# Patient Record
Sex: Female | Born: 1960 | Race: White | Hispanic: No | Smoking: Never smoker
Health system: Southern US, Community
[De-identification: ages and names within clinical notes are randomized; demographics above are authoritative.]

## PROBLEM LIST (undated history)

## (undated) ENCOUNTER — Ambulatory Visit: Discharge status: Absent without leave | Payer: 59

## (undated) DIAGNOSIS — N760 Acute vaginitis: Secondary | ICD-10-CM

## (undated) DIAGNOSIS — F429 Obsessive-compulsive disorder, unspecified: Secondary | ICD-10-CM

## (undated) DIAGNOSIS — K649 Unspecified hemorrhoids: Secondary | ICD-10-CM

## (undated) DIAGNOSIS — D369 Benign neoplasm, unspecified site: Secondary | ICD-10-CM

## (undated) DIAGNOSIS — F259 Schizoaffective disorder, unspecified: Secondary | ICD-10-CM

## (undated) DIAGNOSIS — N950 Postmenopausal bleeding: Secondary | ICD-10-CM

## (undated) DIAGNOSIS — B9689 Other specified bacterial agents as the cause of diseases classified elsewhere: Secondary | ICD-10-CM

## (undated) DIAGNOSIS — I10 Essential (primary) hypertension: Secondary | ICD-10-CM

## (undated) HISTORY — DX: Postmenopausal bleeding: N95.0

## (undated) HISTORY — DX: Unspecified hemorrhoids: K64.9

## (undated) HISTORY — DX: Essential (primary) hypertension: I10

## (undated) HISTORY — DX: Schizoaffective disorder, unspecified: F25.9

## (undated) HISTORY — DX: Other specified bacterial agents as the cause of diseases classified elsewhere: B96.89

## (undated) HISTORY — DX: Obsessive-compulsive disorder, unspecified: F42.9

## (undated) HISTORY — DX: Acute vaginitis: N76.0

## (undated) HISTORY — DX: Benign neoplasm, unspecified site: D36.9

## (undated) HISTORY — PX: BUNIONECTOMY: SHX129

---

## 1999-11-25 ENCOUNTER — Inpatient Hospital Stay (HOSPITAL_COMMUNITY): Admission: AD | Admit: 1999-11-25 | Discharge: 1999-11-30 | Payer: Self-pay | Admitting: *Deleted

## 2001-02-02 ENCOUNTER — Other Ambulatory Visit: Admission: RE | Admit: 2001-02-02 | Discharge: 2001-02-02 | Payer: Self-pay | Admitting: *Deleted

## 2001-02-21 ENCOUNTER — Encounter: Payer: Self-pay | Admitting: Emergency Medicine

## 2001-02-21 ENCOUNTER — Emergency Department (HOSPITAL_COMMUNITY): Admission: EM | Admit: 2001-02-21 | Discharge: 2001-02-21 | Payer: Self-pay | Admitting: Emergency Medicine

## 2002-06-25 ENCOUNTER — Encounter: Payer: Self-pay | Admitting: Family Medicine

## 2002-06-25 ENCOUNTER — Ambulatory Visit (HOSPITAL_COMMUNITY): Admission: RE | Admit: 2002-06-25 | Discharge: 2002-06-25 | Payer: Self-pay | Admitting: Family Medicine

## 2002-08-06 ENCOUNTER — Other Ambulatory Visit: Admission: RE | Admit: 2002-08-06 | Discharge: 2002-08-06 | Payer: Self-pay | Admitting: *Deleted

## 2003-01-11 ENCOUNTER — Ambulatory Visit (HOSPITAL_COMMUNITY): Admission: RE | Admit: 2003-01-11 | Discharge: 2003-01-11 | Payer: Self-pay | Admitting: *Deleted

## 2003-01-11 ENCOUNTER — Encounter: Payer: Self-pay | Admitting: *Deleted

## 2003-07-06 ENCOUNTER — Ambulatory Visit (HOSPITAL_COMMUNITY): Admission: RE | Admit: 2003-07-06 | Discharge: 2003-07-06 | Payer: Self-pay | Admitting: Family Medicine

## 2004-08-22 ENCOUNTER — Ambulatory Visit (HOSPITAL_COMMUNITY): Admission: RE | Admit: 2004-08-22 | Discharge: 2004-08-22 | Payer: Self-pay | Admitting: Family Medicine

## 2005-08-26 ENCOUNTER — Ambulatory Visit (HOSPITAL_COMMUNITY): Admission: RE | Admit: 2005-08-26 | Discharge: 2005-08-26 | Payer: Self-pay | Admitting: Family Medicine

## 2005-11-27 ENCOUNTER — Inpatient Hospital Stay (HOSPITAL_COMMUNITY): Admission: RE | Admit: 2005-11-27 | Discharge: 2005-12-02 | Payer: Self-pay | Admitting: Psychiatry

## 2005-11-28 ENCOUNTER — Ambulatory Visit: Payer: Self-pay | Admitting: Psychiatry

## 2006-05-28 ENCOUNTER — Emergency Department (HOSPITAL_COMMUNITY): Admission: EM | Admit: 2006-05-28 | Discharge: 2006-05-28 | Payer: Self-pay | Admitting: Emergency Medicine

## 2006-08-28 ENCOUNTER — Ambulatory Visit (HOSPITAL_COMMUNITY): Admission: RE | Admit: 2006-08-28 | Discharge: 2006-08-28 | Payer: Self-pay | Admitting: Family Medicine

## 2007-09-07 ENCOUNTER — Ambulatory Visit (HOSPITAL_COMMUNITY): Admission: RE | Admit: 2007-09-07 | Discharge: 2007-09-07 | Payer: Self-pay | Admitting: Family Medicine

## 2008-09-07 ENCOUNTER — Ambulatory Visit (HOSPITAL_COMMUNITY): Admission: RE | Admit: 2008-09-07 | Discharge: 2008-09-07 | Payer: Self-pay | Admitting: Family Medicine

## 2009-09-11 ENCOUNTER — Ambulatory Visit (HOSPITAL_COMMUNITY)
Admission: RE | Admit: 2009-09-11 | Discharge: 2009-09-11 | Payer: Self-pay | Source: Home / Self Care | Admitting: Family Medicine

## 2010-08-24 ENCOUNTER — Other Ambulatory Visit (HOSPITAL_COMMUNITY): Payer: Self-pay | Admitting: Family Medicine

## 2010-08-24 DIAGNOSIS — Z1239 Encounter for other screening for malignant neoplasm of breast: Secondary | ICD-10-CM

## 2010-08-26 ENCOUNTER — Encounter: Payer: Self-pay | Admitting: Family Medicine

## 2010-09-13 ENCOUNTER — Ambulatory Visit (HOSPITAL_COMMUNITY)
Admission: RE | Admit: 2010-09-13 | Discharge: 2010-09-13 | Disposition: A | Discharge status: Home / Self Care | Payer: PRIVATE HEALTH INSURANCE | Source: Ambulatory Visit | Attending: Family Medicine | Admitting: Family Medicine

## 2010-09-13 DIAGNOSIS — Z1239 Encounter for other screening for malignant neoplasm of breast: Secondary | ICD-10-CM

## 2010-09-13 DIAGNOSIS — Z1231 Encounter for screening mammogram for malignant neoplasm of breast: Secondary | ICD-10-CM | POA: Insufficient documentation

## 2010-12-21 NOTE — Discharge Summary (Signed)
[name], [name] NO.:  [id number]   MEDICAL RECORD NO.:  [id number]          PATIENT TYPE:  IPS   LOCATION:  0506                          FACILITY:  [facility]   PHYSICIAN:  Anselm Jungling, MD  DATE OF BIRTH:  11-15-1960   DATE OF ADMISSION:  11/27/2005  DATE OF DISCHARGE:  12/02/2005                                 DISCHARGE SUMMARY   IDENTIFYING DATA AND REASON FOR ADMISSION:  The patient is a 50 year old  divorced white female with a history of schizoaffective disorder, and a  patient of Dr. Betti Cruz.  She was admitted for exacerbation of psychotic  symptoms including auditory hallucinations and impulses to harm people.  She  had been undergoing various medication changes recently, prior to admission.  Please refer to the admission note for further details pertaining to the  symptoms, circumstances and history that led to hospitalization.  She was  given an initial Axis I diagnosis of schizoaffective disorder, acute  exacerbation, with psychosis.   MEDICAL AND LABORATORY:  The patient was medically and physically assessed  by the psychiatric nurse practitioner upon admission.  There were no  significant medical issues during this brief inpatient psychiatric stay.   HOSPITAL COURSE:  The patient presented as a pleasant, cooperative, well  nourished and normally developed adult female who was fully oriented.  Her  thoughts and speech were normally organized, and there were no overtly  delusional statements.  Her mood actually appeared neutral, and her affect  was pleasant and appropriate.  She did admit to auditory hallucinations.  She very much wanted help with medication stabilization.  She expressed  concerns that without treatment that she would perhaps respond to impulses  and voices telling her to be aggressive towards others.   The patient was brought to [name] on a regimen of Geodon 60 mg q.h.s., Depakote  1000 mg q.h.s., and Prozac 40 mg daily.  She was  also weaning off Anafranil  at the time of admission.  Her Geodon was increased to 60 mg b.i.d., and  Depakote ER was increased to 1000 mg q.h.s.  Later, Geodon was further  increased to 120 mg at bedtime only.  Ambien 10 mg at bedtime was given on  an as-needed basis for sleep.  She was continued on Prozac 20 mg daily.  Depakote level was pending at the time of discharge.   Over her five-day inpatient stay, the patient remained pleasant and  appropriate.  By the fourth hospital day, her auditory hallucinations had  abated, and she experienced no more thoughts or impulses to be aggressive  towards anyone else.  On the sixth hospital day, she indicated that she felt  ready for discharge.  She was felt to be significantly improved and not to  be a danger to herself or anyone else.   AFTERCARE:  The patient was to follow-up with Dr. Betti Cruz on Dec 05, 2005.  In  addition, there was to be an appointment with Scarlette Slice at the mental  health center, to be scheduled at the time of this dictation.   DISCHARGE MEDICATIONS:  1.  Depakote ER 1000 mg q.h.s.  2.  Prozac 40 mg daily.  3.  Geodon 120 mg q.h.s.  4.  Ambien 10 mg q.h.s. p.r.n. insomnia.   DISCHARGE DIAGNOSIS:  AXIS I:  Schizoaffective disorder, not otherwise  specified.  AXIS II:  Deferred.  AXIS III:  No acute or chronic illnesses.  AXIS IV:  Stressors severe.  AXIS V:  GAF on discharge 60.           ______________________________  Anselm Jungling, MD  Electronically Signed     SPB/MEDQ  D:  12/02/2005  T:  12/02/2005  Job:  [id number]
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
## 2011-08-05 ENCOUNTER — Other Ambulatory Visit (HOSPITAL_COMMUNITY): Payer: Self-pay | Admitting: Family Medicine

## 2011-08-05 DIAGNOSIS — Z139 Encounter for screening, unspecified: Secondary | ICD-10-CM

## 2011-09-16 ENCOUNTER — Ambulatory Visit (HOSPITAL_COMMUNITY)
Admission: RE | Admit: 2011-09-16 | Discharge: 2011-09-16 | Disposition: A | Discharge status: Home / Self Care | Payer: PRIVATE HEALTH INSURANCE | Source: Ambulatory Visit | Attending: Family Medicine | Admitting: Family Medicine

## 2011-09-16 DIAGNOSIS — Z1231 Encounter for screening mammogram for malignant neoplasm of breast: Secondary | ICD-10-CM | POA: Insufficient documentation

## 2011-09-16 DIAGNOSIS — Z139 Encounter for screening, unspecified: Secondary | ICD-10-CM

## 2011-09-23 ENCOUNTER — Other Ambulatory Visit: Payer: Self-pay | Admitting: Family Medicine

## 2011-09-23 DIAGNOSIS — R928 Other abnormal and inconclusive findings on diagnostic imaging of breast: Secondary | ICD-10-CM

## 2011-10-02 ENCOUNTER — Ambulatory Visit (HOSPITAL_COMMUNITY)
Admission: RE | Admit: 2011-10-02 | Discharge: 2011-10-02 | Disposition: A | Discharge status: Home / Self Care | Payer: PRIVATE HEALTH INSURANCE | Source: Ambulatory Visit | Attending: Family Medicine | Admitting: Family Medicine

## 2011-10-02 DIAGNOSIS — R928 Other abnormal and inconclusive findings on diagnostic imaging of breast: Secondary | ICD-10-CM

## 2012-04-18 ENCOUNTER — Encounter (HOSPITAL_COMMUNITY): Payer: Self-pay | Admitting: Emergency Medicine

## 2012-04-18 ENCOUNTER — Emergency Department (HOSPITAL_COMMUNITY)
Admission: EM | Admit: 2012-04-18 | Discharge: 2012-04-19 | Disposition: A | Discharge status: Home / Self Care | Payer: PRIVATE HEALTH INSURANCE | Attending: Emergency Medicine | Admitting: Emergency Medicine

## 2012-04-18 DIAGNOSIS — T50901A Poisoning by unspecified drugs, medicaments and biological substances, accidental (unintentional), initial encounter: Secondary | ICD-10-CM

## 2012-04-18 DIAGNOSIS — F319 Bipolar disorder, unspecified: Secondary | ICD-10-CM | POA: Insufficient documentation

## 2012-04-18 DIAGNOSIS — T43591A Poisoning by other antipsychotics and neuroleptics, accidental (unintentional), initial encounter: Secondary | ICD-10-CM | POA: Insufficient documentation

## 2012-04-18 DIAGNOSIS — T43501A Poisoning by unspecified antipsychotics and neuroleptics, accidental (unintentional), initial encounter: Secondary | ICD-10-CM | POA: Insufficient documentation

## 2012-04-18 LAB — POCT PREGNANCY, URINE: Preg Test, Ur: NEGATIVE

## 2012-04-18 NOTE — ED Notes (Addendum)
Patient has 2 bottles of Geodon. States she was took three 60mg  capsules, then took three of the 80mg  capsules. States it was an accident, that she didn't read the label before taking the medicine. Patient alert and oriented at triage but states she feels "sleepy."

## 2012-04-19 LAB — RAPID URINE DRUG SCREEN, HOSP PERFORMED
Amphetamines: NOT DETECTED
Benzodiazepines: NOT DETECTED
Cocaine: NOT DETECTED
Opiates: NOT DETECTED
Tetrahydrocannabinol: NOT DETECTED

## 2012-04-19 LAB — URINALYSIS, ROUTINE W REFLEX MICROSCOPIC
Ketones, ur: NEGATIVE mg/dL
Leukocytes, UA: NEGATIVE
Protein, ur: NEGATIVE mg/dL
Urobilinogen, UA: 0.2 mg/dL (ref 0.0–1.0)
pH: 6 (ref 5.0–8.0)

## 2012-04-19 LAB — ACETAMINOPHEN LEVEL: Acetaminophen (Tylenol), Serum: <15 ug/mL (ref 10–30)

## 2012-04-19 NOTE — ED Provider Notes (Signed)
History     CSN: [id number]  Arrival date & time 04/18/12  2207   First MD Initiated Contact with Patient 04/18/12 2236      Chief Complaint  Patient presents with  . Drug Overdose    (Consider location/radiation/quality/duration/timing/severity/associated sxs/prior treatment) HPI Comments: [name] [name] presents with an accidental overdose of Geodon.  At 8 pm, she took all of her evening medicines which included geodon 180 mg which was just prescribed for her 2 days ago,  A reduced dose from the 240 mg she had been on for the past month.  She forgot to remove the older geodon bottle from her supply and therefore took this as well.  She reports drowsiness, but no other symptoms.  She denies suicidal thoughts or intent and does not have a history of suicidal gestures or attempts.    The history is provided by the patient.    Past Medical History  Diagnosis Date  . Depression   . Bipolar 1 disorder     Past Surgical History  Procedure Date  . Bunionectomy     History reviewed. No pertinent family history.  History  Substance Use Topics  . Smoking status: Never Smoker   . Smokeless tobacco: Not on file  . Alcohol Use: No    OB History    Grav Para Term Preterm Abortions TAB SAB Ect Mult Living                  Review of Systems  Constitutional: Positive for fatigue. Negative for fever.  HENT: Negative for congestion, sore throat and neck pain.   Eyes: Negative.   Respiratory: Negative for chest tightness and shortness of breath.   Cardiovascular: Negative for chest pain.  Gastrointestinal: Negative for nausea and abdominal pain.  Genitourinary: Negative.   Musculoskeletal: Negative for joint swelling and arthralgias.  Skin: Negative.  Negative for rash and wound.  Neurological: Negative for dizziness, weakness, light-headedness, numbness and headaches.  Hematological: Negative.   Psychiatric/Behavioral: Negative.     Allergies  Penicillins  Home  Medications   Current Outpatient Rx  Name Route Sig Dispense Refill  . DIVALPROEX SODIUM 500 MG PO TBEC Oral Take 1,000 mg by mouth at bedtime.    [name] ZIPRASIDONE HCL 60 MG PO CAPS Oral Take 180 mg by mouth at bedtime.    [name] ZIPRASIDONE HCL 80 MG PO CAPS Oral Take 240 mg by mouth daily.      BP 135/81  Pulse 87  Temp 97.8 F (36.6 C) (Oral)  Resp 20  SpO2 96%  LMP 03/18/2012  Physical Exam  Nursing note and vitals reviewed. Constitutional: She appears well-developed and well-nourished.  HENT:  Head: Normocephalic and atraumatic.  Eyes: Conjunctivae normal are normal.  Neck: Normal range of motion.  Cardiovascular: Normal rate, regular rhythm, normal heart sounds and intact distal pulses.   Pulmonary/Chest: Effort normal and breath sounds normal. She has no wheezes.  Abdominal: Soft. Bowel sounds are normal. There is no tenderness.  Musculoskeletal: Normal range of motion.  Neurological: She is alert.  Skin: Skin is warm and dry.  Psychiatric: She has a normal mood and affect.    ED Course  Procedures (including critical care time)  Labs Reviewed  URINALYSIS, ROUTINE W REFLEX MICROSCOPIC - Abnormal; Notable for the following:    Specific Gravity, Urine <1.005 (*)     All other components within normal limits  SALICYLATE LEVEL - Abnormal; Notable for the following:    Salicylate Lvl <2.0 (*)  All other components within normal limits  ACETAMINOPHEN LEVEL  URINE RAPID DRUG SCREEN (HOSP PERFORMED)  POCT PREGNANCY, URINE   No results found.   1. Accidental drug overdose       MDM  1:06 AM  Recheck of patient who is sleeping,  But wakes easily. No distress at this time.  Husband to drive patient home.  Advised pt to discard old remaining geodon  medication to avoid similar occurrence  Labs reviewed prior to dc home   Also discussed case with Dr. Colon Branch prior to dc home.      Date: 04/18/2012  Rate: 88  Rhythm: normal sinus rhythm  QRS Axis: normal   Intervals: QT prolonged  ST/T Wave abnormalities: normal  Conduction Disutrbances:none  Narrative Interpretation:   Old EKG Reviewed: none available          Burgess Amor, PA 04/19/12 0110

## 2012-04-19 NOTE — ED Provider Notes (Signed)
Medical screening examination/treatment/procedure(s) were conducted as a shared visit with non-physician practitioner(s) and myself.  I personally evaluated the patient during the encounter  Nicoletta Dress. Colon Branch, MD 04/19/12 0900

## 2012-04-19 NOTE — ED Provider Notes (Signed)
0200 Patient here having taken too much geodon accidentally. Was on 180 mg and changed to 240 mg. Took both doses. She has not experienced syncope, hypotension,hyperglycemia, tardive dyskinesia, suicidal thinking, drowsiness, nausea, dry mouth, dizziness. Her EKG shows Prolonged QT with QT 460/QTc 556 ms. NO previous EKGs to compare.  Approved for discharge. Medical screening examination/treatment/procedure(s) were conducted as a shared visit with non-physician practitioner(s) and myself.  I personally evaluated the patient during the encounter  Nicoletta Dress. Colon Branch, MD 04/19/12 [phone]

## 2012-04-19 NOTE — Progress Notes (Signed)
0500 Poison Control called to check on patient. They wanted the patient brought back and kept until EKG normalized. I disagreed. With no side effects of overdose except QT prolongation, with patient given instructions to return if she experience ANY of the side effects, with patient having been on a large initial  dose of the geodon for some time, patient to hold geodon for 24 hours, do not agree with returning patient to the ER.

## 2012-04-19 NOTE — ED Notes (Signed)
Pt discharged. Pt stable at time of discharge. pt has no questions regarding discharge at this time. Pt voiced understanding of discharge instructions.  

## 2012-05-28 ENCOUNTER — Encounter: Payer: Self-pay | Admitting: Cardiology

## 2012-06-01 ENCOUNTER — Encounter: Payer: Self-pay | Admitting: *Deleted

## 2012-06-02 ENCOUNTER — Ambulatory Visit (INDEPENDENT_AMBULATORY_CARE_PROVIDER_SITE_OTHER): Payer: PRIVATE HEALTH INSURANCE | Admitting: Cardiology

## 2012-06-02 ENCOUNTER — Encounter: Payer: Self-pay | Admitting: Cardiology

## 2012-06-02 ENCOUNTER — Ambulatory Visit: Payer: PRIVATE HEALTH INSURANCE | Admitting: Cardiology

## 2012-06-02 VITALS — BP 140/80 | HR 98 | Wt 167.0 lb

## 2012-06-02 DIAGNOSIS — I10 Essential (primary) hypertension: Secondary | ICD-10-CM | POA: Insufficient documentation

## 2012-06-02 DIAGNOSIS — R55 Syncope and collapse: Secondary | ICD-10-CM

## 2012-06-02 DIAGNOSIS — R9431 Abnormal electrocardiogram [ECG] [EKG]: Secondary | ICD-10-CM

## 2012-06-02 DIAGNOSIS — R011 Cardiac murmur, unspecified: Secondary | ICD-10-CM

## 2012-06-02 HISTORY — DX: Syncope and collapse: R55

## 2012-06-02 HISTORY — DX: Abnormal electrocardiogram (ECG) (EKG): R94.31

## 2012-06-02 NOTE — Assessment & Plan Note (Signed)
Noted by ECG, QT interval has decreased compared to last tracing. Suspect that this is medication related since she is on Geodon, Tofranil, and Anafranil, all of which can prolong the QT interval. She should have close followup with a behavioral health specialist to determine whether medication adjustments are needed.

## 2012-06-02 NOTE — Assessment & Plan Note (Signed)
Blood pressure is mildly elevated today. No changes made. Keep follow up with Dr. Sudie Bailey.

## 2012-06-02 NOTE — Assessment & Plan Note (Signed)
Likely benign, but will be further assessed with echocardiogram in light of her spells.

## 2012-06-02 NOTE — Progress Notes (Signed)
Clinical Summary [name] [name] is a 51 y.o.female referred for cardiology consultation by Dr. Sudie Bailey. History is reviewed below. 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 She states that she feels weak and heavy headed for several minutes, usually back to baseline by 30 minutes. It is not entirely certain whether eating or drinking fluids at the time is helpful. She states that episodes may occur as offen as once every few weeks.  Recent lab work showed BUN 15, creatinine 0.6, sodium 139, potassium 4.1, AST 18, ALT 13, hemoglobin 14.2, platelets 228. ECG from September showed sinus rhythm with possible left facial enlargement, prolonged QT interval, nonspecific ST changes.  ECG today shows sinus rhythm at 91 with possible left atrial enlargement and QTC 508 ms.  She reports no exertional chest pain. Describes NYHA class II dyspnea on exertion. No peripheral edema, no orthopnea or PND.   Allergies  Allergen Reactions  . Penicillins Rash    Current Outpatient Prescriptions  Medication Sig Dispense Refill  . betamethasone dipropionate (DIPROLENE) 0.05 % cream Apply 0.05 % topically 2 (two) times daily.      . calcium citrate-vitamin D (CITRACAL+D) 315-200 MG-UNIT per tablet Take 1 tablet by mouth daily.      . clomiPRAMINE (ANAFRANIL) 25 MG capsule Take 100 mg by mouth at bedtime.      . divalproex (DEPAKOTE) 500 MG DR tablet Take 1,000 mg by mouth at bedtime.      [name] FLUoxetine (PROZAC) 40 MG capsule Take 80 mg by mouth daily.      . hydrochlorothiazide (HYDRODIURIL) 25 MG tablet Take 25 mg by mouth daily.      [name] imipramine (TOFRANIL) 50 MG tablet       . medroxyPROGESTERone (PROVERA) 10 MG  tablet Take 10 mg by mouth daily.      . Multiple Vitamin (MULTIVITAMIN) tablet Take 1 tablet by mouth daily.      . ziprasidone (GEODON) 80 MG capsule Take 240 mg by mouth daily.        Past Medical History  Diagnosis Date  . Obsessive-compulsive disorders   . Postmenopausal bleeding   . Essential hypertension, benign   . Schizoaffective disorder     Past Surgical History  Procedure Date  . Bunionectomy     Family History  Problem Relation Age of Onset  . Diabetes type II Maternal Grandmother   . Hypertension Maternal Aunt     Social History [name] [name] reports that she has never smoked. She does not have any smokeless tobacco history on file. [name] [name] reports that she does not drink alcohol.  Review of Systems No specific sense of palpitations. No reported bleeding episodes. Reports mental health care through a behavioral health specialists. Stable appetite. No fevers or chills. Otherwise negative except as outlined.  Physical Examination Filed Vitals:   06/02/12 1401  BP: 140/80  Pulse: 98   Filed Weights   06/02/12 1401  Weight: 167 lb (75.751 kg)   Patient in no acute distress. HEENT: Conjunctiva and lids normal, oropharynx clear. Neck: Supple, no elevated JVP or carotid bruits, no thyromegaly. Lungs: Clear to auscultation, nonlabored breathing at rest. Cardiac: Regular rate and rhythm, no S3, soft systolic murmur, no pericardial rub.  Abdomen: Soft, nontender, bowel sounds present, no guarding or rebound. Extremities: No pitting edema, distal pulses 2+. Skin: Warm and dry. Musculoskeletal: No kyphosis. Neuropsychiatric: Alert and oriented x3, calm, normal speech pattern.   Problem List and Plan   Syncope The patient is actually describing episodes of intermittent weakness and feeling of "heavy head" without specific trigger. It is not certain whether episodes of "low blood sugar" are related or not. She has not passed out with any of these spells.  Baseline ECG does show a prolonged QT interval, although this is likely secondary to her current medications. Plan is to obtain an echocardiogram to assess cardiac structure and function, also provide a ten-day cardiac monitor to exclude any arrhythmias. We will inform her of the results.  Prolonged Q-T interval on ECG Noted by ECG, QT interval has decreased compared to last tracing. Suspect that this is medication related since she is on Geodon, Tofranil, and Anafranil, all of which can prolong the QT interval. She should have close followup with a behavioral health specialist to determine whether medication adjustments are needed.  Murmur, cardiac Likely benign, but will be further assessed with echocardiogram in light of her spells.  Essential hypertension, benign Blood pressure is mildly elevated today. No changes made. Keep follow up with Dr. Sudie Bailey.    Jonelle Sidle, M.D., F.A.C.C.

## 2012-06-02 NOTE — Assessment & Plan Note (Signed)
The patient is actually describing episodes of intermittent weakness and feeling of "heavy head" without specific trigger. It is not certain whether episodes of "low blood sugar" are related or not. She has not passed out with any of these spells. Baseline ECG does show a prolonged QT interval, although this is likely secondary to her current medications. Plan is to obtain an echocardiogram to assess cardiac structure and function, also provide a ten-day cardiac monitor to exclude any arrhythmias. We will inform her of the results.

## 2012-06-02 NOTE — Patient Instructions (Signed)
Your physician recommends that you schedule a follow-up appointment in: We will call you with results  Your physician has requested that you have an echocardiogram. Echocardiography is a painless test that uses sound waves to create images of your heart. It provides your doctor with information about the size and shape of your heart and how well your heart's chambers and valves are working. This procedure takes approximately one hour. There are no restrictions for this procedure.  Your physician has recommended that you wear an event monitor. Event monitors are medical devices that record the heart's electrical activity. Doctors most often [name] these monitors to diagnose arrhythmias. Arrhythmias are problems with the speed or rhythm of the heartbeat. The monitor is a small, portable device. You can wear one while you do your normal daily activities. This is usually used to diagnose what is causing palpitations/syncope (passing out).

## 2012-06-05 ENCOUNTER — Ambulatory Visit (HOSPITAL_COMMUNITY)
Admission: RE | Admit: 2012-06-05 | Discharge: 2012-06-05 | Disposition: A | Discharge status: Home / Self Care | Payer: Medicare (Managed Care) | Source: Ambulatory Visit | Attending: Cardiology | Admitting: Cardiology

## 2012-06-05 DIAGNOSIS — R011 Cardiac murmur, unspecified: Secondary | ICD-10-CM

## 2012-06-05 DIAGNOSIS — R55 Syncope and collapse: Secondary | ICD-10-CM | POA: Insufficient documentation

## 2012-06-05 NOTE — Progress Notes (Signed)
*  PRELIMINARY RESULTS* Echocardiogram 2D Echocardiogram has been performed.  [name] [name], [name] [name] 06/05/2012, 10:58 AM

## 2012-06-15 ENCOUNTER — Other Ambulatory Visit: Payer: Self-pay | Admitting: Cardiology

## 2012-06-15 DIAGNOSIS — R55 Syncope and collapse: Secondary | ICD-10-CM

## 2012-08-26 ENCOUNTER — Other Ambulatory Visit (HOSPITAL_COMMUNITY): Payer: Self-pay | Admitting: Family Medicine

## 2012-08-26 DIAGNOSIS — Z139 Encounter for screening, unspecified: Secondary | ICD-10-CM

## 2012-10-02 ENCOUNTER — Ambulatory Visit (HOSPITAL_COMMUNITY)
Admission: RE | Admit: 2012-10-02 | Discharge: 2012-10-02 | Disposition: A | Discharge status: Home / Self Care | Payer: BC Managed Care – PPO | Source: Ambulatory Visit | Attending: Family Medicine | Admitting: Family Medicine

## 2012-10-02 DIAGNOSIS — Z139 Encounter for screening, unspecified: Secondary | ICD-10-CM

## 2012-10-02 DIAGNOSIS — Z1231 Encounter for screening mammogram for malignant neoplasm of breast: Secondary | ICD-10-CM | POA: Insufficient documentation

## 2013-01-04 ENCOUNTER — Encounter: Payer: Self-pay | Admitting: *Deleted

## 2013-01-05 ENCOUNTER — Other Ambulatory Visit: Payer: Self-pay | Admitting: Obstetrics & Gynecology

## 2013-01-06 ENCOUNTER — Ambulatory Visit (INDEPENDENT_AMBULATORY_CARE_PROVIDER_SITE_OTHER): Payer: BC Managed Care – PPO | Admitting: Obstetrics & Gynecology

## 2013-01-06 ENCOUNTER — Encounter: Payer: Self-pay | Admitting: Obstetrics & Gynecology

## 2013-01-06 VITALS — BP 100/80 | Ht 61.0 in | Wt 166.0 lb

## 2013-01-06 DIAGNOSIS — Z01419 Encounter for gynecological examination (general) (routine) without abnormal findings: Secondary | ICD-10-CM

## 2013-01-06 DIAGNOSIS — Z1212 Encounter for screening for malignant neoplasm of rectum: Secondary | ICD-10-CM

## 2013-01-06 NOTE — Progress Notes (Signed)
Patient ID: [name] [name], female   DOB: [birth year], 52 y.o.   MRN: [id number] Subjective:     [name] [name] is a 52 y.o. female here for a routine exam.  Patient's last menstrual period was 03/18/2012. No obstetric history on file. Current complaints: none.  Personal health questionnaire reviewed: no.   Gynecologic History Patient's last menstrual period was 03/18/2012. Contraception: post menopausal status Last Pap: 2013. Results were: normal Last mammogram: 2013. Results were: normal  Obstetric History OB History   Grav Para Term Preterm Abortions TAB SAB Ect Mult Living                   The following portions of the patient's history were reviewed and updated as appropriate: allergies, current medications, past family history, past medical history, past social history, past surgical history and problem list.  Review of Systems  Review of Systems  Constitutional: Negative for fever, chills, weight loss, malaise/fatigue and diaphoresis.  HENT: Negative for hearing loss, ear pain, nosebleeds, congestion, sore throat, neck pain, tinnitus and ear discharge.   Eyes: Negative for blurred vision, double vision, photophobia, pain, discharge and redness.  Respiratory: Negative for cough, hemoptysis, sputum production, shortness of breath, wheezing and stridor.   Cardiovascular: Negative for chest pain, palpitations, orthopnea, claudication, leg swelling and PND.  Gastrointestinal: negative for abdominal pain. Negative for heartburn, nausea, vomiting, diarrhea, constipation, blood in stool and melena.  Genitourinary: Negative for dysuria, urgency, frequency, hematuria and flank pain.  Musculoskeletal: Negative for myalgias, back pain, joint pain and falls.  Skin: Negative for itching and rash.  Neurological: Negative for dizziness, tingling, tremors, sensory change, speech change, focal weakness, seizures, loss of consciousness, weakness and headaches.  Endo/Heme/Allergies: Negative for  environmental allergies and polydipsia. Does not bruise/bleed easily.  Psychiatric/Behavioral: Negative for depression, suicidal ideas, hallucinations, memory loss and substance abuse. The patient is not nervous/anxious and does not have insomnia.        Objective:    Physical Exam  Vitals reviewed. Constitutional: She is oriented to person, place, and time. She appears well-developed and well-nourished.  HENT:  Head: Normocephalic and atraumatic.        Right Ear: External ear normal.  Left Ear: External ear normal.  Nose: Nose normal.  Mouth/Throat: Oropharynx is clear and moist.  Eyes: Conjunctivae and EOM are normal. Pupils are equal, round, and reactive to light. Right eye exhibits no discharge. Left eye exhibits no discharge. No scleral icterus.  Neck: Normal range of motion. Neck supple. No tracheal deviation present. No thyromegaly present.  Cardiovascular: Normal rate, regular rhythm, normal heart sounds and intact distal pulses.  Exam reveals no gallop and no friction rub.   No murmur heard. Respiratory: Effort normal and breath sounds normal. No respiratory distress. She has no wheezes. She has no rales. She exhibits no tenderness.  GI: Soft. Bowel sounds are normal. She exhibits no distension and no mass. There is no tenderness. There is no rebound and no guarding.  Genitourinary:       Vulva is normal without lesions Vagina is pink moist without discharge Cervix normal in appearance and pap is done Uterus is normal size shape and contour Adnexa is negative with normal sized ovaries  Rectal heme negative no masses normal tone  Musculoskeletal: Normal range of motion. She exhibits no edema and no tenderness.  Neurological: She is alert and oriented to person, place, and time. She has normal reflexes. She displays normal reflexes. No cranial nerve deficit. She exhibits  normal muscle tone. Coordination normal.  Skin: Skin is warm and dry. No rash noted. No erythema. No pallor.   Psychiatric: She has a normal mood and affect. Her behavior is normal. Judgment and thought content normal.       Assessment:    Healthy female exam.    Plan:    Follow up in: 1 year.

## 2013-01-26 ENCOUNTER — Telehealth: Payer: Self-pay | Admitting: Obstetrics & Gynecology

## 2013-01-26 NOTE — Telephone Encounter (Signed)
Left message. JSY 

## 2013-01-27 ENCOUNTER — Telehealth: Payer: Self-pay | Admitting: Obstetrics & Gynecology

## 2013-01-27 NOTE — Telephone Encounter (Signed)
PT has vaginal discharge .color white/yellow .appointment given for 01/28/13 at 3.30pm to see jennifer griffin.

## 2013-01-28 ENCOUNTER — Encounter: Payer: Self-pay | Admitting: Adult Health

## 2013-01-28 ENCOUNTER — Ambulatory Visit (INDEPENDENT_AMBULATORY_CARE_PROVIDER_SITE_OTHER): Payer: BC Managed Care – PPO | Admitting: Adult Health

## 2013-01-28 VITALS — BP 122/80 | Ht 62.0 in | Wt 170.0 lb

## 2013-01-28 DIAGNOSIS — B9689 Other specified bacterial agents as the cause of diseases classified elsewhere: Secondary | ICD-10-CM

## 2013-01-28 DIAGNOSIS — A499 Bacterial infection, unspecified: Secondary | ICD-10-CM

## 2013-01-28 DIAGNOSIS — N898 Other specified noninflammatory disorders of vagina: Secondary | ICD-10-CM

## 2013-01-28 DIAGNOSIS — N760 Acute vaginitis: Secondary | ICD-10-CM

## 2013-01-28 HISTORY — DX: Other specified bacterial agents as the cause of diseases classified elsewhere: B96.89

## 2013-01-28 LAB — POCT WET PREP (WET MOUNT)
Trichomonas Wet Prep HPF POC: NEGATIVE
WBC, Wet Prep HPF POC: NEGATIVE

## 2013-01-28 MED ORDER — METRONIDAZOLE 500 MG PO TABS: 500.0000 mg | ORAL_TABLET | Freq: Two times a day (BID) | ORAL | Status: DC

## 2013-01-28 NOTE — Patient Instructions (Addendum)
Bacterial Vaginosis Bacterial vaginosis (BV) is a vaginal infection where the normal balance of bacteria in the vagina is disrupted. The normal balance is then replaced by an overgrowth of certain bacteria. There are several different kinds of bacteria that can cause BV. BV is the most common vaginal infection in women of childbearing age. CAUSES   The cause of BV is not fully understood. BV develops when there is an increase or imbalance of harmful bacteria.  Some activities or behaviors can upset the normal balance of bacteria in the vagina and put women at increased risk including:  Having a new sex partner or multiple sex partners.  Douching.  Using an intrauterine device (IUD) for contraception.  It is not clear what role sexual activity plays in the development of BV. However, women that have never had sexual intercourse are rarely infected with BV. Women do not get BV from toilet seats, bedding, swimming pools or from touching objects around them.  SYMPTOMS   Grey vaginal discharge.  A fish-like odor with discharge, especially after sexual intercourse.  Itching or burning of the vagina and vulva.  Burning or pain with urination.  Some women have no signs or symptoms at all. DIAGNOSIS  Your caregiver must examine the vagina for signs of BV. Your caregiver will perform lab tests and look at the sample of vaginal fluid through a microscope. They will look for bacteria and abnormal cells (clue cells), a pH test higher than 4.5, and a positive amine test all associated with BV.  RISKS AND COMPLICATIONS   Pelvic inflammatory disease (PID).  Infections following gynecology surgery.  Developing HIV.  Developing herpes virus. TREATMENT  Sometimes BV will clear up without treatment. However, all women with symptoms of BV should be treated to avoid complications, especially if gynecology surgery is planned. Female partners generally do not need to be treated. However, BV may spread  between female sex partners so treatment is helpful in preventing a recurrence of BV.   BV may be treated with antibiotics. The antibiotics come in either pill or vaginal cream forms. Either can be used with nonpregnant or pregnant women, but the recommended dosages differ. These antibiotics are not harmful to the baby.  BV can recur after treatment. If this happens, a second round of antibiotics will often be prescribed.  Treatment is important for pregnant women. If not treated, BV can cause a premature delivery, especially for a pregnant woman who had a premature birth in the past. All pregnant women who have symptoms of BV should be checked and treated.  For chronic reoccurrence of BV, treatment with a type of prescribed gel vaginally twice a week is helpful. HOME CARE INSTRUCTIONS   Finish all medication as directed by your caregiver.  Do not have sex until treatment is completed.  Tell your sexual partner that you have a vaginal infection. They should see their caregiver and be treated if they have problems, such as a mild rash or itching.  Practice safe sex. Use condoms. Only have 1 sex partner. PREVENTION  Basic prevention steps can help reduce the risk of upsetting the natural balance of bacteria in the vagina and developing BV:  Do not have sexual intercourse (be abstinent).  Do not douche.  Use all of the medicine prescribed for treatment of BV, even if the signs and symptoms go away.  Tell your sex partner if you have BV. That way, they can be treated, if needed, to prevent reoccurrence. SEEK MEDICAL CARE IF:     Your symptoms are not improving after 3 days of treatment.  You have increased discharge, pain, or fever. MAKE SURE YOU:   Understand these instructions.  Will watch your condition.  Will get help right away if you are not doing well or get worse. FOR MORE INFORMATION  Division of STD Prevention (DSTDP), Centers for Disease Control and Prevention:  SolutionApps.co.za American Social Health Association (ASHA): www.ashastd.org  Document Released: 07/22/2005 Document Revised: 10/14/2011 Document Reviewed: 01/12/2009 [facility] Patient Information 2014 [place], [place]. Take flagyl 500 1 bid x 7 days

## 2013-01-28 NOTE — Progress Notes (Signed)
Subjective:     Patient ID: [name] [name], female   DOB: 25-May-1961, 52 y.o.   MRN: [id number]  HPI [name] [name] is a 52 year old white female divorced in complaining of a vaginal discharge and odor.  Review of Systems Positives in HPI Reviewed past medical,surgical, social and family history. Reviewed medications and allergies.     Objective:   Physical Exam BP 122/80  Ht 5\' 2"  (1.575 m)  Wt 170 lb (77.111 kg)  BMI 31.09 kg/m2  LMP 03/18/2012   Skin warm and dry.Pelvic: external genitalia is normal in appearance, vagina: white discharge with odor, cervix:smooth with negative CMT, uterus: normal size, shape and contour, non tender, no masses felt, adnexa: no masses or tenderness noted. Wet prep: + for clue cells   Assessment:      Vaginal discharge BV     Plan:     Rx flagyl 500 mg 1 bid x 7 days Follow up prn

## 2013-02-06 ENCOUNTER — Other Ambulatory Visit: Payer: Self-pay | Admitting: Adult Health

## 2013-02-08 ENCOUNTER — Other Ambulatory Visit: Payer: Self-pay | Admitting: Adult Health

## 2013-03-18 ENCOUNTER — Other Ambulatory Visit: Payer: Self-pay | Admitting: Obstetrics & Gynecology

## 2013-03-18 ENCOUNTER — Ambulatory Visit (INDEPENDENT_AMBULATORY_CARE_PROVIDER_SITE_OTHER): Payer: BC Managed Care – PPO | Admitting: Obstetrics & Gynecology

## 2013-03-18 ENCOUNTER — Ambulatory Visit (INDEPENDENT_AMBULATORY_CARE_PROVIDER_SITE_OTHER): Payer: BC Managed Care – PPO

## 2013-03-18 ENCOUNTER — Encounter: Payer: Self-pay | Admitting: Obstetrics & Gynecology

## 2013-03-18 VITALS — BP 110/80 | Wt 174.0 lb

## 2013-03-18 DIAGNOSIS — N950 Postmenopausal bleeding: Secondary | ICD-10-CM

## 2013-03-18 NOTE — Patient Instructions (Signed)
Postmenopausal Bleeding Menopause is commonly referred to as the "change in life." It is a time when the fertile years, the time of ovulating and having menstrual periods, has come to an end. It is also determined by not having menstrual periods for 12 months.  Postmenopausal bleeding is any bleeding a woman has after she has entered into menopause. Any type of postmenopausal bleeding, even if it appears to be a typical menstrual period, is concerning. This should be evaluated by your caregiver.  CAUSES   Hormone therapy.  Cancer of the cervix or cancer of the lining of the uterus (endometrial cancer).  Thinning of the uterine lining (uterine atrophy).  Thyroid diseases.  Certain medicines.  Infection of the uterus or cervix.  Inflammation or irritation of the uterine lining (endometritis).  Estrogen-secreting tumors.  Growths (polyps) on the cervix, uterine lining, or uterus.  Uterine tumors (fibroids).  Being very overweight (obese). DIAGNOSIS  Your caregiver will take a medical history and ask questions. A physical exam will also be performed. Further tests may include:   A transvaginal ultrasound. An ultrasound wand or probe is inserted into your vagina to view the pelvic organs.  A biopsy of the lining of the uterus (endometrium). A sample of the endometrium is removed and examined.  A hysteroscopy. Your caregiver may use an instrument with a light and a camera attached to it (hysteroscope). The hysteroscope is used to look inside the uterus for problems.  A dilation and curettage (D&C). Tissue is removed from the uterine lining to be examined for problems. TREATMENT  Treatment depends on the cause of the bleeding. Some treatments include:   Surgery.  Medicines.  Hormones.  A hysteroscopy or D&C to remove polyps or fibroids.  Changing or stopping a current medicine you are taking. Talk to your caregiver about your specific treatment. HOME CARE INSTRUCTIONS    Maintain a healthy weight.  Keep regular pelvic exams and Pap tests. SEEK MEDICAL CARE IF:   You have bleeding, even if it is light in comparison to your previous periods.  Your bleeding lasts more than 1 week.  You have abdominal pain.  You develop bleeding with sexual intercourse. SEEK IMMEDIATE MEDICAL CARE IF:   You have a fever, chills, headache, dizziness, muscle aches, and bleeding.  You have severe pain with bleeding.  You are passing blood clots.  You have bleeding and need more than 1 pad an hour.  You feel faint. MAKE SURE YOU:  Understand these instructions.  Will watch your condition.  Will get help right away if you are not doing well or get worse. Document Released: 10/30/2005 Document Revised: 10/14/2011 Document Reviewed: 03/28/2011 [employer] Patient Information 2014 [employer].  

## 2013-03-18 NOTE — Progress Notes (Signed)
Patient ID: [name] [name], female   DOB: 04-29-1961, 52 y.o.   MRN: [id number] [name] [name] is in the office because of some bleeding she experienced 4-5 days ago. He was enough to wear a pantiliner but it was more like spotting not significant bleeding It is noted that we manage her many perimenopausal time of her life using monthly Provera and she went a whole year without having any subsequent bleeding As a result we saw her for her yearly exam most recently we stopped her cyclical Provera Due to her history I am ordering a pelvic sonogram and the results are below  US Transvaginal Non-ob  03/18/2013   GYNECOLOGIC SONOGRAM   [name] [name] is a 52 y.o. G1P0010  for a pelvic sonogram for post  menopausal bleeding.  Uterus                      5.2 x 3.2  cm, retroverted with 2 fundal  fibroids noted largest =16.4mm, 1 small hyperechoic area noted adjacent to  endom cavity=3.30mm  Endometrium          4.2 mm, symmetrical, no obvious mass noted within  cavity  Right ovary             1.8 x 0.8 x 0.7 cm,   Left ovary                2.1 x 0.9 x 0.9 cm,   No free fluid or adnexal masses noted   Technician Comments:  2 fibroids noted within ut fundus and Endometrial Cavity=4.75mm   Chari Manning 03/18/2013 11:02 AM  1.  Normal pelvic sonogram in a post menopausal woman, no endometrial  biopsy is indicated  EURE,LUTHER H 03/18/2013 11:14 AM      As you can see the sonogram is normal and no endometrial biopsy is indicated She has no endometrial or ovarian pathology noted  I have instructed [name] [name] to simply do nothing and see what happens if she has any significant bleeding in the future she can contact me but for now we will do nothing and see her back for her yearly exam

## 2013-04-26 ENCOUNTER — Ambulatory Visit (INDEPENDENT_AMBULATORY_CARE_PROVIDER_SITE_OTHER): Payer: BC Managed Care – PPO | Admitting: Psychiatry

## 2013-04-26 DIAGNOSIS — F329 Major depressive disorder, single episode, unspecified: Secondary | ICD-10-CM

## 2013-04-26 DIAGNOSIS — F411 Generalized anxiety disorder: Secondary | ICD-10-CM
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
## 2013-04-29 NOTE — Patient Instructions (Signed)
Discussed orally 

## 2013-05-13 ENCOUNTER — Ambulatory Visit (INDEPENDENT_AMBULATORY_CARE_PROVIDER_SITE_OTHER): Payer: BC Managed Care – PPO | Admitting: Psychiatry

## 2013-05-13 DIAGNOSIS — F411 Generalized anxiety disorder: Secondary | ICD-10-CM

## 2013-05-13 DIAGNOSIS — F329 Major depressive disorder, single episode, unspecified: Secondary | ICD-10-CM

## 2013-05-13 NOTE — Patient Instructions (Signed)
Discussed orally 
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
## 2013-05-27 ENCOUNTER — Ambulatory Visit (INDEPENDENT_AMBULATORY_CARE_PROVIDER_SITE_OTHER): Payer: BC Managed Care – PPO | Admitting: Psychiatry

## 2013-05-27 DIAGNOSIS — F329 Major depressive disorder, single episode, unspecified: Secondary | ICD-10-CM

## 2013-05-27 DIAGNOSIS — F411 Generalized anxiety disorder: Secondary | ICD-10-CM

## 2013-05-27 NOTE — Patient Instructions (Signed)
Discussed orally 

## 2013-05-27 NOTE — Progress Notes (Addendum)
Patient:  [name] [name]   DOB: 03-06-1961  MR Number: [id number]  Location: Behavioral Health Center:  [address] [place],  [place], [address]  Start: Thursday 05/27/2013 9:00 AM End: Thursday 05/27/2013 9:50 AM  Provider/Observer:     Florencia Reasons, MSW, LCSW   Chief Complaint:      Chief Complaint  Patient presents with  . Anxiety  . Depression    Reason For Service:    Patient is seeking services for continuity of care. She has a long-standing history of symptoms of depression and anxiety and most recently has been seen at [facility] in [facility] agency where she was receiving psychotherapy. However, patient was informed in September, 2014 she no longer qualifies for services through the agency as patient has private insurance per her report. She reports initially experiencing depression and being hospitalized when she and her husband separated in 1989. Patient has had at least 2 psychiatric hospitalizations since that time. Patient expresses continued regret regarding divorcing her husband in 34. Patient is very religious and continues to experience guilt as she thinks she disobeyed God. She continues to worry about having a relationship with God and making right choices. Patient reports additional stress related to interaction with some of the members at a church she attends as she experiences guilt when they make negative comments about her choices. She is seen for follow up appointment today as she continues to experience stress and anxiety regarding her church and her relationship with her ex-husband   Interventions Strategy:  Supportive therapy  Participation Level:   Active  Participation Quality:  Appropriate      Behavioral Observation:  Casual, Alert, and Appropriate.   Current Psychosocial Factors: Patient has very limited support system. She reports continued stress regarding her relationship with her ex-husband and her church  Content of Session:   Establishing rapport,  reviewing symptoms, processing feelings, discussing boundary issues in patient's relationships, and identifying coping statements  Current Status:   Patient reports continued anxiety and depression. She denies suicidal ideations and homicidal ideations as well as any hallucinations.  Patient Progress:   Patient reports mood has been up and down since last session. She also reports sometimes feeling alone and crying. She continues to experience guilt about past choices and  instances where she thinks she has disobeyed God. She also reports continued frustration regarding choices she makes about her ex-husband. Patient is considering leaving her church as she sometimes states she is a hindrance. Therapist works with patient to process her feelings, to identify thought patterns, and effects on patient's mood and behavior. Therapist also works with patient to identifying coping statements and explore coping and relaxation techniques.  Target Goals:    decrease anxiety  Last Reviewed:     Goals Addressed Today:    decrease anxiety  Impression/Diagnosis:   The patient presents with a long-standing history of symptoms of anxiety and depression and reports a history of at least 3 psychiatric hospitalizations from 1989 through 1990. Patient currently is experiencing depressed mood, tearfulness, anxiety, insomnia, guilt, and excessive worrying. Diagnoses: Major depressive disorder, generalized anxiety disorder   Diagnosis:  Axis I: Major depressive disorder  Generalized anxiety disorder          Axis II: Deferred

## 2013-06-09 ENCOUNTER — Telehealth (HOSPITAL_COMMUNITY): Payer: Self-pay | Admitting: *Deleted

## 2013-06-10 ENCOUNTER — Ambulatory Visit (INDEPENDENT_AMBULATORY_CARE_PROVIDER_SITE_OTHER): Payer: BC Managed Care – PPO | Admitting: Psychiatry

## 2013-06-10 DIAGNOSIS — F411 Generalized anxiety disorder: Secondary | ICD-10-CM

## 2013-06-10 DIAGNOSIS — F329 Major depressive disorder, single episode, unspecified: Secondary | ICD-10-CM

## 2013-06-10 NOTE — Progress Notes (Signed)
Patient:  [name] [name]   DOB: Jul 02, 1961  MR Number: [id number]  Location: [facility]:  [address] [place],  [place], [address]  Start: Thursday 05/27/2013 9:00 AM End: Thursday 05/27/2013 9:50 AM  Provider/Observer:     Florencia Reasons, MSW, LCSW   Chief Complaint:      Chief Complaint  Patient presents with  . Anxiety  . Depression    Reason For Service:    Patient is seeking services for continuity of care. She has a long-standing history of symptoms of depression and anxiety and most recently has been seen at [facility] in [facility] agency where she was receiving psychotherapy. However, patient was informed in September, 2014 she no longer qualifies for services through the agency as patient has private insurance per her report.  Patient receives medication management from [facility] where she is being treated for schizoaffective disorder. She reports initially experiencing depression and being hospitalized when she and her husband separated in 1989. Patient has had at least 2 psychiatric hospitalizations since that time. Patient expresses continued regret regarding divorcing her husband in 79. Patient is very religious and continues to experience guilt as she thinks she disobeyed God. She continues to worry about having a relationship with God and making right choices. Patient reports additional stress related to interaction with some of the members at a church she attends as she experiences guilt when they make negative comments about her choices.  Patient is seen for a followup appointment today she continues to experience stress, anxiety, and depressed mood.   Interventions Strategy:  Supportive therapy  Participation Level:   Active  Participation Quality:  Appropriate      Behavioral Observation:  Casual, Alert, and Appropriate.   Current Psychosocial Factors: Patient has very limited support system. She reports incident at work where she made an inappropriate comment.  Patient expresses guilt about recent interaction with a cousin who as patient to take him to buy beer Content of Session:   reviewing symptoms, processing feelings, developing treatment plan discussing boundary issues in patient's relationships, and identifying coping statements  Current Status:   Patient reports continued anxiety and depression. She denies suicidal ideations and homicidal ideations as well as any hallucinations.  Patient Progress:   Patient reports mood has been up and down since last session. She expresses guilt about inappropriate comment she made at work yesterday. Therapist works with patient to process her feelings and to identify coping statements. She also expresses guilt about taking her cousin to buy beer. Therapist and patient discuss boundary issues and ways to start improving assertiveness skills. Patient expresses guilt about missing church and continues to blame self for external events beyond her control. Patient expresses frustration about her dog as she considers dog as dangerous and states no longer wanting to take care of dog. She states getting  the dog 3 years ago against her wishes because her husband wanted her to get dog. Therapist works with patient to identify her options. Patient is considering givinng the dog to a International aid/development worker or to an animal shelter but is concerned about ex-husband's possible response.  Therapist works with patient to develop treatment plan.  Target Goals:    1. Improve assertiveness skills and ability to set and maintain boundaries: 1:1 psychotherapy one time every one to 2 weeks (supportive, CBT)     2. Improve impulse control-think before speaking: 1:1 psychotherapy one time every one to 4 weeks (supportive, CBT)  Last Reviewed:   06/10/2013  Goals Addressed  Today:    Goal 1  Impression/Diagnosis:   The patient presents with a long-standing history of symptoms of anxiety and depression and reports a history of at least 3 psychiatric  hospitalizations from 1989 through 1990. Patient currently is experiencing depressed mood, tearfulness, anxiety, insomnia, guilt, and excessive worrying. Diagnoses: Major depressive disorder, generalized anxiety disorder   Diagnosis:  Axis I: Major depressive disorder  Generalized anxiety disorder          Axis II: Deferred

## 2013-06-10 NOTE — Patient Instructions (Signed)
Discussed orally 

## 2013-06-24 ENCOUNTER — Ambulatory Visit (INDEPENDENT_AMBULATORY_CARE_PROVIDER_SITE_OTHER): Payer: BC Managed Care – PPO | Admitting: Psychiatry

## 2013-06-24 DIAGNOSIS — F411 Generalized anxiety disorder: Secondary | ICD-10-CM

## 2013-06-24 DIAGNOSIS — F329 Major depressive disorder, single episode, unspecified: Secondary | ICD-10-CM

## 2013-06-24 NOTE — Progress Notes (Signed)
Patient:  [name] [name]   DOB: [birth year]  MR Number: [id number]  Location: [facility] Center:  [address] [place],  [place], [address]  Start: Thursday 05/27/2013 9:00 AM End: Thursday 05/27/2013 9:50 AM  Provider/Observer:     Florencia Reasons, MSW, LCSW   Chief Complaint:      Chief Complaint  Patient presents with  . Anxiety  . Depression    Reason For Service:    Patient is seeking services for continuity of care. She has a long-standing history of symptoms of depression and anxiety and most recently has been seen at [facility] in [facility] agency where she was receiving psychotherapy. However, patient was informed in September, 2014 she no longer qualifies for services through the agency as patient has private insurance per her report.  Patient receives medication management from [facility] where she is being treated for schizoaffective disorder. She reports initially experiencing depression and being hospitalized when she and her husband separated in 1989. Patient has had at least 2 psychiatric hospitalizations since that time. Patient expresses continued regret regarding divorcing her husband in 74. Patient is very religious and continues to experience guilt as she thinks she disobeyed God. She continues to worry about having a relationship with God and making right choices. Patient reports additional stress related to interaction with some of the members at a church she attends as she experiences guilt when they make negative comments about her choices.  Patient is seen for a followup appointment today.   Interventions Strategy:  Supportive therapy, cognitive behavioral  Participation Level:   Active  Participation Quality:  Appropriate      Behavioral Observation:  Casual, Alert, and Appropriate.   Current Psychosocial Factors: Patient has very limited support system.   Content of Session:   reviewing symptoms, processing feelings, exploring ways to begin to improve  assertiveness skills and to set and maintain boundaries, and identifying coping statements  Current Status:   Patient reports improved mood and decreased anxiety. She denies suicidal ideations and homicidal ideations as well as any hallucinations.  Patient Progress:   Patient reports positive mood since last session and says she hasn't had any crying spells. She continues to experience guilt when she does not attend church and blames herself regarding external events that are beyond her control. Therapist works with patient to process her feelings and to identify coping statements. She continues to express frustration regarding relationship with ex-husband as she thinks he is using her at times. She states difficulty telling him no as she says she still loves him and knows he needs help. However, she says she knows God is the only one that can really help and that she can't  do it. Therapist works with patient to process her feelings and to explore ways to set and maintain boundaries. Patient is looking forward to celebrating Thanksgiving with relatives and friends. She continues to attend work regularly. T  Target Goals:    1. Improve assertiveness skills and ability to set and maintain boundaries: 1:1 psychotherapy one time every one to 2 weeks (supportive, CBT)     2. Improve impulse control-think before speaking: 1:1 psychotherapy one time every one to 4 weeks (supportive, CBT)  Last Reviewed:   06/10/2013  Goals Addressed Today:    Goal 1  Impression/Diagnosis:   The patient presents with a long-standing history of symptoms of anxiety and depression and reports a history of at least 3 psychiatric hospitalizations from 1989 through 1990. Patient currently is experiencing depressed  mood, tearfulness, anxiety, insomnia, guilt, and excessive worrying. Diagnoses: Major depressive disorder, generalized anxiety disorder   Diagnosis:  Axis I: Major depressive disorder  Generalized anxiety  disorder          Axis II: Deferred

## 2013-06-24 NOTE — Patient Instructions (Signed)
Discussed orally 

## 2013-07-14 ENCOUNTER — Ambulatory Visit (INDEPENDENT_AMBULATORY_CARE_PROVIDER_SITE_OTHER): Payer: BC Managed Care – PPO | Admitting: Psychiatry

## 2013-07-14 DIAGNOSIS — F329 Major depressive disorder, single episode, unspecified: Secondary | ICD-10-CM

## 2013-07-14 DIAGNOSIS — F411 Generalized anxiety disorder: Secondary | ICD-10-CM

## 2013-07-14 NOTE — Progress Notes (Signed)
Patient:  [name] [name]   DOB: 26-May-1961  MR Number: [id number]  Location: [facility]al Health Center:  [address] [place] [place],  [place], [address]  Start: Wednesday 07/14/2013 9:10 AM End: Wednesday 07/14/2013 10:00 AM  Provider/Observer:     Florencia Reasons, MSW, LCSW   Chief Complaint:      Chief Complaint  Patient presents with  . Stress  . Anxiety  . Depression    Reason For Service:    Patient is seeking services for continuity of care. She has a long-standing history of symptoms of depression and anxiety and most recently has been seen at [facility] in [facility] agency where she was receiving psychotherapy. However, patient was informed in September, 2014 she no longer qualifies for services through the agency as patient has private insurance per her report.  Patient receives medication management from [facility] where she is being treated for schizoaffective disorder. She reports initially experiencing depression and being hospitalized when she and her husband separated in 1989. Patient has had at least 2 psychiatric hospitalizations since that time. Patient expresses continued regret regarding divorcing her husband in 89. Patient is very religious and continues to experience guilt as she thinks she disobeyed God. She continues to worry about having a relationship with God and making right choices. Patient reports additional stress related to interaction with some of the members at a church she attends as she experiences guilt when they make negative comments about her choices.  Patient is seen for a followup appointment today.   Interventions Strategy:  Supportive therapy, cognitive behavioral, psychoeducation  Participation Level:   Active  Participation Quality:  Appropriate      Behavioral Observation:  Casual, Alert, and Appropriate.   Current Psychosocial Factors: Patient has very limited support system.   Content of Session:   reviewing symptoms, processing feelings, exploring ways  to begin to improve assertiveness skills and to set and maintain boundaries, and identifying coping statements  Current Status:   Patient reports improved mood and decreased anxiety. She reports having fleeting suicidal ideations last week but states that she wouldn't do anything stupid like that. Thoughts appeared to ve triggered by patient feeling guilty about not attending church. She denies current suicidal ideations and homicidal ideations as well as any hallucinations. Patient agrees to call this practice, call 911, or have someone take her to the emergency room should symptoms worsen.  Patient Progress:   Patient reports being pleased that she had a positive performance evaluation at her job yesterday and received a 20% raise. She also reports enjoying celebrating Thanksgiving with her aunt. She continues to express guilt about not attending church but is pleased that she resume attending this past Sunday. However, she is very critical that she arrived a church late. She continues to blame self regarding external events that are beyond her control. Therapist works with patient to identify and challenge thought process as well as provide psychoeducation regarding the effects of patient's illness on her thought patterns. She also continues to have difficulty setting boundaries with her ex-husband and cousin. Therapist works with patient to identify assertive statements and coping statements. Patient is looking forward to celebrating Christmas with her aunt.  Target Goals:    1. Improve assertiveness skills and ability to set and maintain boundaries: 1:1 psychotherapy one time every one to 2 weeks (supportive, CBT)     2. Improve impulse control-think before speaking: 1:1 psychotherapy one time every one to 4 weeks (supportive, CBT)  Last Reviewed:   06/10/2013  Goals Addressed Today:    Goal 1  Impression/Diagnosis:   The patient presents with a long-standing history of symptoms of anxiety and  depression and reports a history of at least 3 psychiatric hospitalizations from 1989 through 1990. Patient currently is experiencing depressed mood, tearfulness, anxiety, insomnia, guilt, and excessive worrying. Diagnoses: Major depressive disorder, generalized anxiety disorder   Diagnosis:  Axis I: Major depressive disorder  Generalized anxiety disorder          Axis II: Deferred

## 2013-07-14 NOTE — Patient Instructions (Signed)
Discussed orally 

## 2013-08-03 ENCOUNTER — Ambulatory Visit (INDEPENDENT_AMBULATORY_CARE_PROVIDER_SITE_OTHER): Payer: BC Managed Care – PPO | Admitting: Psychiatry

## 2013-08-03 DIAGNOSIS — F411 Generalized anxiety disorder: Secondary | ICD-10-CM

## 2013-08-03 DIAGNOSIS — F329 Major depressive disorder, single episode, unspecified: Secondary | ICD-10-CM

## 2013-08-03 NOTE — Progress Notes (Signed)
Patient:  [name] [name]   DOB: 10/04/1960  MR Number: [id number]  Location: Behavioral Health Center:  [address] [place],  [place], [address]  Start: Tuesday 08/03/2013 10:00 AM End: Tuesday 08/03/2013 10:55 AM  Provider/Observer:     Florencia Reasons, MSW, LCSW   Chief Complaint:      Chief Complaint  Patient presents with  . Anxiety  . Depression    Reason For Service:    Patient is seeking services for continuity of care. She has a long-standing history of symptoms of depression and anxiety and most recently has been seen at [facility] in [facility] agency where she was receiving psychotherapy. However, patient was informed in September, 2014 she no longer qualifies for services through the agency as patient has private insurance per her report.  Patient receives medication management from [facility] where she is being treated for schizoaffective disorder. She reports initially experiencing depression and being hospitalized when she and her husband separated in 1989. Patient has had at least 2 psychiatric hospitalizations since that time. Patient expresses continued regret regarding divorcing her husband in 72. Patient is very religious and continues to experience guilt as she thinks she disobeyed God. She continues to worry about having a relationship with God and making right choices. Patient reports additional stress related to interaction with some of the members at a church she attends as she experiences guilt when they make negative comments about her choices.  Patient is seen for a followup appointment today.   Interventions Strategy:  Supportive therapy, cognitive behavioral,   Participation Level:   Active  Participation Quality:  Appropriate      Behavioral Observation:  Casual, Alert, and Appropriate.   Current Psychosocial Factors: Patient has very limited support system.   Content of Session:   reviewing symptoms, processing feelings, reinforcing patient's efforts to improve  assertiveness skills and to set and maintain boundaries, and reviewing coping statements  Current Status:   Patient reports continued improved mood and decreased anxiety. She denies suicidal ideations and homicidal ideations as well as any hallucinations.   Patient Progress:   Patient reports being in a positive mood and states feeling much better since last session. She says she enjoyed celebrating Christmas with family in [place]. Her ex-husband and boyfriend have not asked patient to take them to get beer or drugs. However, she expresses anxiety regarding the possibility they will. Therapist works with patient to review ways to improve assertiveness skills and to set and maintain boundaries. Therapist and patient also review coping statements. Patient reports she did not attend church this past Sunday but is experiencing less guilt. She plans to attend church for a New Year's Eve service and is looking forward to seeing her ex-husband.   Target Goals:    1. Improve assertiveness skills and ability to set and maintain boundaries: 1:1 psychotherapy one time every one to 2 weeks (supportive, CBT)     2. Improve impulse control-think before speaking: 1:1 psychotherapy one time every one to 4 weeks (supportive, CBT)  Last Reviewed:   06/10/2013  Goals Addressed Today:    Goal 1  Impression/Diagnosis:   The patient presents with a long-standing history of symptoms of anxiety and depression and reports a history of at least 3 psychiatric hospitalizations from 1989 through 1990. Patient currently is experiencing depressed mood, tearfulness, anxiety, insomnia, guilt, and excessive worrying. Diagnoses: Major depressive disorder, generalized anxiety disorder   Diagnosis:  Axis I: Major depressive disorder  Generalized anxiety disorder  Axis II: Deferred

## 2013-08-03 NOTE — Patient Instructions (Signed)
Discussed orally 

## 2013-08-30 ENCOUNTER — Ambulatory Visit (INDEPENDENT_AMBULATORY_CARE_PROVIDER_SITE_OTHER): Payer: 59 | Admitting: Psychiatry

## 2013-08-30 DIAGNOSIS — F329 Major depressive disorder, single episode, unspecified: Secondary | ICD-10-CM

## 2013-08-30 DIAGNOSIS — F411 Generalized anxiety disorder: Secondary | ICD-10-CM

## 2013-08-31 ENCOUNTER — Other Ambulatory Visit (HOSPITAL_COMMUNITY): Payer: Self-pay | Admitting: Family Medicine

## 2013-08-31 DIAGNOSIS — Z139 Encounter for screening, unspecified: Secondary | ICD-10-CM

## 2013-08-31 NOTE — Patient Instructions (Signed)
Discussed orally 

## 2013-08-31 NOTE — Progress Notes (Addendum)
Patient:  [name] [name]   DOB: 22-Apr-1961  MR Number: [id number]  Location: [place]:  [address] [place],  [place], [address]  Start: Monday 08/30/2013 3:05 PM End: Monday 08/30/2013 3:55 PM  Provider/Observer:     Maurice Small, MSW, LCSW   Chief Complaint:      Chief Complaint  Patient presents with  . Anxiety  . Depression    Reason For Service:    Patient is seeking services for continuity of care. She has a long-standing history of symptoms of depression and anxiety and most recently has been seen at [facility] in [facility] agency where she was receiving psychotherapy. However, patient was informed in September, 2014 she no longer qualifies for services through the agency as patient has private insurance per her report.  Patient receives medication management from [facility] where she is being treated for schizoaffective disorder. She reports initially experiencing depression and being hospitalized when she and her husband separated in 1989. Patient has had at least 2 psychiatric hospitalizations since that time. Patient expresses continued regret regarding divorcing her husband in 68. Patient is very religious and continues to experience guilt as she thinks she disobeyed God. She continues to worry about having a relationship with God and making right choices. Patient reports additional stress related to interaction with some of the members at a church she attends as she experiences guilt when they make negative comments about her choices.  Patient is seen for a followup appointment today.   Interventions Strategy:  Supportive therapy, cognitive behavioral,   Participation Level:   Active  Participation Quality:  Appropriate      Behavioral Observation:  Casual, Alert, and Appropriate.   Current Psychosocial Factors: Patient reports recent conflict with her aunt.  Content of Session:   reviewing symptoms, processing feelings, reinforcing patient's efforts to improve  assertiveness skills and to set and maintain boundaries, and reviewing coping statements  Current Status:   Patient reports continued improved mood and decreased anxiety. She denies suicidal ideations and homicidal ideations as well as any hallucinations.   Patient Progress:   Patient reports feeling better overall but being frustrated with her aunt yesterday due to negative comments from aunt about patient's ex-husband. Therapist works with patient to process feelings. She also expresses frustration with self for recently taking husband and cousin to get beer. She is pleased she was able to set boundaries with cousin regarding borrowing money. Patient expresses frustration regarding one of her coworkers  as he left some of his work Pharmacist, hospital in [employer]. She also expresses hurt and states her pastor's wife looked at her in a way they indicated to patient that she didn't think patient needed to be at church. Therapist works with patient to discuss thought patterns and alternative ways of thinking about the 2 situations. Patient admits she sometimes can't think negatively and may misinterpret peoples actions.   Target Goals:    1. Improve assertiveness skills and ability to set and maintain boundaries: 1:1 psychotherapy one time every one to 2 weeks (supportive, CBT)     2. Improve impulse control-think before speaking: 1:1 psychotherapy one time every one to 4 weeks (supportive, CBT)  Last Reviewed:   06/10/2013  Goals Addressed Today:    Goal 1  Impression/Diagnosis:   The patient presents with a long-standing history of symptoms of anxiety and depression and reports a history of at least 3 psychiatric hospitalizations from [place] through 1990. Patient currently is experiencing depressed mood, tearfulness, anxiety, insomnia,  guilt, and excessive worrying. Diagnoses: Major depressive disorder, generalized anxiety disorder   Diagnosis:  Axis I: Major depressive disorder  Generalized anxiety  disorder          Axis II: Deferred

## 2013-09-24 ENCOUNTER — Ambulatory Visit (INDEPENDENT_AMBULATORY_CARE_PROVIDER_SITE_OTHER): Payer: 59 | Admitting: Psychiatry

## 2013-09-24 DIAGNOSIS — F329 Major depressive disorder, single episode, unspecified: Secondary | ICD-10-CM

## 2013-09-24 DIAGNOSIS — F411 Generalized anxiety disorder: Secondary | ICD-10-CM

## 2013-09-24 NOTE — Patient Instructions (Signed)
Discussed orally 
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
## 2013-09-27 ENCOUNTER — Ambulatory Visit (HOSPITAL_COMMUNITY): Payer: Self-pay | Admitting: Psychiatry

## 2013-10-05 ENCOUNTER — Ambulatory Visit (HOSPITAL_COMMUNITY)
Admission: RE | Admit: 2013-10-05 | Discharge: 2013-10-05 | Disposition: A | Discharge status: Home / Self Care | Payer: 59 | Source: Ambulatory Visit | Attending: Family Medicine | Admitting: Family Medicine

## 2013-10-05 DIAGNOSIS — Z1231 Encounter for screening mammogram for malignant neoplasm of breast: Secondary | ICD-10-CM | POA: Insufficient documentation

## 2013-10-05 DIAGNOSIS — Z139 Encounter for screening, unspecified: Secondary | ICD-10-CM

## 2013-10-22 ENCOUNTER — Ambulatory Visit (INDEPENDENT_AMBULATORY_CARE_PROVIDER_SITE_OTHER): Payer: 59 | Admitting: Psychiatry

## 2013-10-22 DIAGNOSIS — F329 Major depressive disorder, single episode, unspecified: Secondary | ICD-10-CM

## 2013-10-22 DIAGNOSIS — F411 Generalized anxiety disorder: Secondary | ICD-10-CM

## 2013-10-22 NOTE — Patient Instructions (Signed)
Discussed orally 

## 2013-10-22 NOTE — Progress Notes (Signed)
   THERAPIST PROGRESS NOTE  Session Time: Friday 10/22/2013 9:10 AM -9:55 AM  Participation Level: Active  Behavioral Response: CasualAlertAnxious  Type of Therapy: Individual Therapy  Treatment Goals addressed:  Improve assertiveness skills and ability to set and maintain boundaries  Interventions: CBT and Supportive  Summary: [name] [name] is a 53 y.o. female who presents with a long-standing history of symptoms of anxiety and depression and reports a history of at least 3 psychiatric hospitalizations from 71 through 1990. Patient 's symptoms have included depressed mood, tearfulness, anxiety, insomnia, guilt, and excessive worrying. Patient continues to experience negative thinking patterns and and guilt. She and shares recent incident of going to church, reports being closed, and think in this indicated church members did not want patient to attend church. She tends to have perfectionistic thinking patterns often becoming excessively critical of self when she does not achieve goals perfectly or follow-through on commitments. A shunt continues to see psychiatrist Dr. Nila Nephew at [facility] and reports seeing her this week. She has a followup medication management appointment in June 2015 per patient's report   Suicidal/Homicidal: No  Therapist Response: Therapist works with patient to examine thought patterns along with effects on mood and behavior, dispute irrational thoughts, identify alternative thinking patterns, and identify coping statements to overcome perfectionism.  Plan: Return again in 4 weeks.  Diagnosis: Axis I: Major depressive disorder, generalized anxiety disorder    Axis II: Deferred    Alphonsa Brickle, LCSW 10/22/2013

## 2013-11-08 ENCOUNTER — Ambulatory Visit (INDEPENDENT_AMBULATORY_CARE_PROVIDER_SITE_OTHER): Payer: 59 | Admitting: Psychiatry

## 2013-11-08 DIAGNOSIS — F329 Major depressive disorder, single episode, unspecified: Secondary | ICD-10-CM

## 2013-11-08 DIAGNOSIS — F411 Generalized anxiety disorder: Secondary | ICD-10-CM
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
## 2013-11-08 NOTE — Patient Instructions (Signed)
Discussed orally 

## 2013-11-19 ENCOUNTER — Ambulatory Visit (INDEPENDENT_AMBULATORY_CARE_PROVIDER_SITE_OTHER): Payer: 59 | Admitting: Psychiatry

## 2013-11-19 DIAGNOSIS — F411 Generalized anxiety disorder: Secondary | ICD-10-CM

## 2013-11-19 DIAGNOSIS — F329 Major depressive disorder, single episode, unspecified: Secondary | ICD-10-CM

## 2013-11-19 NOTE — Progress Notes (Signed)
   THERAPIST PROGRESS NOTE  Session Time:  Friday 11/19/2013 9:15 AM - 9:45 AM  Participation Level: Active  Behavioral Response: CasualAlertAnxious  Type of Therapy: Individual Therapy  Treatment Goals addressed: .Improve assertiveness skills and ability to set and maintain boundaries    Interventions: CBT and Supportive  Summary: [name] [name] is a 53 y.o. female who presents with a long-standing history of symptoms of anxiety and depression and reports a history of at least 3 psychiatric hospitalizations from 10 through 1990. Patient 's symptoms have included depressed mood, tearfulness, anxiety, insomnia, guilt, and excessive worrying, Patient reports improved mood since last session 1 1/2 weeks ago. She continues to worry and remain very critical and judgmental of self. She expresses guilt regarding not attending church the past two nights.  Patient continues to assume negative thoughts about others perception of patient and cites an incident with a coworker at her job She has improved assertiveness skills and cites incident of telling ex-husband no. She states wanting to be able to do this with her aunt. Patient also expresses desire to manage weight.   Suicidal/Homicidal: No  Therapist Response: Therapist works with patient to examine thought patterns along with effects on mood and behavior, dispute irrational thoughts, identify alternative thinking patterns, and identify coping statements to overcome perfectionism. Therapist praises efforts to improve assertiveness skills.   Plan: Return again in 4 weeks     Diagnosis: Axis I: MDD    Axis II: Deferred    Amory Zbikowski, LCSW 11/19/2013

## 2013-11-19 NOTE — Patient Instructions (Signed)
Discussed orally 

## 2013-11-29 ENCOUNTER — Telehealth (HOSPITAL_COMMUNITY): Payer: Self-pay | Admitting: *Deleted

## 2013-11-30 ENCOUNTER — Telehealth (HOSPITAL_COMMUNITY): Payer: Self-pay | Admitting: *Deleted

## 2013-12-17 ENCOUNTER — Ambulatory Visit (HOSPITAL_COMMUNITY): Payer: Self-pay | Admitting: Psychiatry

## 2013-12-24 ENCOUNTER — Emergency Department (HOSPITAL_COMMUNITY): Payer: 59

## 2013-12-24 ENCOUNTER — Encounter (HOSPITAL_COMMUNITY): Payer: Self-pay | Admitting: Emergency Medicine

## 2013-12-24 ENCOUNTER — Emergency Department (HOSPITAL_COMMUNITY)
Admission: EM | Admit: 2013-12-24 | Discharge: 2013-12-24 | Disposition: A | Discharge status: Home / Self Care | Payer: 59 | Attending: Emergency Medicine | Admitting: Emergency Medicine

## 2013-12-24 DIAGNOSIS — M940 Chondrocostal junction syndrome [Tietze]: Secondary | ICD-10-CM | POA: Insufficient documentation

## 2013-12-24 DIAGNOSIS — Z880 Allergy status to penicillin: Secondary | ICD-10-CM | POA: Insufficient documentation

## 2013-12-24 DIAGNOSIS — R071 Chest pain on breathing: Secondary | ICD-10-CM | POA: Insufficient documentation

## 2013-12-24 DIAGNOSIS — Z79899 Other long term (current) drug therapy: Secondary | ICD-10-CM | POA: Insufficient documentation

## 2013-12-24 DIAGNOSIS — I10 Essential (primary) hypertension: Secondary | ICD-10-CM | POA: Insufficient documentation

## 2013-12-24 DIAGNOSIS — F259 Schizoaffective disorder, unspecified: Secondary | ICD-10-CM | POA: Insufficient documentation

## 2013-12-24 DIAGNOSIS — Z8742 Personal history of other diseases of the female genital tract: Secondary | ICD-10-CM | POA: Insufficient documentation

## 2013-12-24 DIAGNOSIS — Z8619 Personal history of other infectious and parasitic diseases: Secondary | ICD-10-CM | POA: Insufficient documentation

## 2013-12-24 DIAGNOSIS — IMO0002 Reserved for concepts with insufficient information to code with codable children: Secondary | ICD-10-CM | POA: Insufficient documentation

## 2013-12-24 DIAGNOSIS — F429 Obsessive-compulsive disorder, unspecified: Secondary | ICD-10-CM | POA: Insufficient documentation

## 2013-12-24 DIAGNOSIS — R0789 Other chest pain: Secondary | ICD-10-CM

## 2013-12-24 LAB — CBC WITH DIFFERENTIAL/PLATELET
BASOS ABS: 0 10*3/uL (ref 0.0–0.1)
Basophils Relative: 1 % (ref 0–1)
EOS ABS: 0.1 10*3/uL (ref 0.0–0.7)
EOS PCT: 2 % (ref 0–5)
HCT: 38.9 % (ref 36.0–46.0)
Hemoglobin: 13.2 g/dL (ref 12.0–15.0)
Lymphocytes Relative: 36 % (ref 12–46)
Lymphs Abs: 2.3 10*3/uL (ref 0.7–4.0)
MCH: 30.3 pg (ref 26.0–34.0)
MCHC: 33.9 g/dL (ref 30.0–36.0)
MCV: 89.4 fL (ref 78.0–100.0)
Monocytes Absolute: 0.5 10*3/uL (ref 0.1–1.0)
Monocytes Relative: 7 % (ref 3–12)
Neutro Abs: 3.4 10*3/uL (ref 1.7–7.7)
Neutrophils Relative %: 54 % (ref 43–77)
PLATELETS: 265 10*3/uL (ref 150–400)
RBC: 4.35 MIL/uL (ref 3.87–5.11)
RDW: 13.2 % (ref 11.5–15.5)
WBC: 6.3 10*3/uL (ref 4.0–10.5)

## 2013-12-24 LAB — COMPREHENSIVE METABOLIC PANEL
ALT: 15 U/L (ref 0–35)
AST: 23 U/L (ref 0–37)
Albumin: 3.8 g/dL (ref 3.5–5.2)
Alkaline Phosphatase: 65 U/L (ref 39–117)
BUN: 14 mg/dL (ref 6–23)
CALCIUM: 9.6 mg/dL (ref 8.4–10.5)
CO2: 27 mEq/L (ref 19–32)
CREATININE: 0.7 mg/dL (ref 0.50–1.10)
Chloride: 98 mEq/L (ref 96–112)
GFR calc non Af Amer: >90 mL/min (ref >90)
GLUCOSE: 99 mg/dL (ref 70–99)
Potassium: 3.8 mEq/L (ref 3.7–5.3)
SODIUM: 138 meq/L (ref 137–147)
TOTAL PROTEIN: 7.3 g/dL (ref 6.0–8.3)
Total Bilirubin: 0.2 mg/dL — ABNORMAL LOW (ref 0.3–1.2)

## 2013-12-24 LAB — I-STAT TROPONIN, ED: TROPONIN I, POC: 0 ng/mL (ref 0.00–0.08)

## 2013-12-24 LAB — TROPONIN I: Troponin I: <0.3 ng/mL (ref <0.30)

## 2013-12-24 MED ORDER — CYCLOBENZAPRINE HCL 10 MG PO TABS
5.0000 mg | ORAL_TABLET | Freq: Three times a day (TID) | ORAL | Status: DC | PRN
  Administered 2013-12-24: 5 mg via ORAL
  Filled 2013-12-24: qty 1

## 2013-12-24 MED ORDER — IBUPROFEN 800 MG PO TABS
800.0000 mg | ORAL_TABLET | Freq: Once | ORAL | Status: AC
  Administered 2013-12-24: 800 mg via ORAL
  Filled 2013-12-24: qty 1

## 2013-12-24 MED ORDER — CYCLOBENZAPRINE HCL 5 MG PO TABS: 5.0000 mg | ORAL_TABLET | Freq: Three times a day (TID) | ORAL | Status: DC | PRN

## 2013-12-24 MED ORDER — NAPROXEN SODIUM 550 MG PO TABS
550.0000 mg | ORAL_TABLET | Freq: Two times a day (BID) | ORAL | Status: DC
Start: 2013-12-24 — End: 2014-05-24

## 2013-12-24 NOTE — Discharge Instructions (Signed)
Try heat and cold to your chest. Take the medications as prescribed. Recheck if you feel worse such as get short of breath, get sweating or if you feel worse.    Chest Wall Pain Chest wall pain is pain in or around the bones and muscles of your chest. It may take up to 6 weeks to get better. It may take longer if you must stay physically active in your work and activities.  CAUSES  Chest wall pain may happen on its own. However, it may be caused by:  A viral illness like the flu.  Injury.  Coughing.  Exercise.  Arthritis.  Fibromyalgia.  Shingles. HOME CARE INSTRUCTIONS   Avoid overtiring physical activity. Try not to strain or perform activities that cause pain. This includes any activities using your chest or your abdominal and side muscles, especially if heavy weights are used.  Put ice on the sore area.  Put ice in a plastic bag.  Place a towel between your skin and the bag.  Leave the ice on for 15-20 minutes per hour while awake for the first 2 days.  Only take over-the-counter or prescription medicines for pain, discomfort, or fever as directed by your caregiver. SEEK IMMEDIATE MEDICAL CARE IF:   Your pain increases, or you are very uncomfortable.  You have a fever.  Your chest pain becomes worse.  You have new, unexplained symptoms.  You have nausea or vomiting.  You feel sweaty or lightheaded.  You have a cough with phlegm (sputum), or you cough up blood. MAKE SURE YOU:   Understand these instructions.  Will watch your condition.  Will get help right away if you are not doing well or get worse. Document Released: 07/22/2005 Document Revised: 10/14/2011 Document Reviewed: 03/18/2011 [facility] Patient Information 2014 [place], [place].  Costochondritis Costochondritis, sometimes called Tietze syndrome, is a swelling and irritation (inflammation) of the tissue (cartilage) that connects your ribs with your breastbone (sternum). It causes pain in the  chest and rib area. Costochondritis usually goes away on its own over time. It can take up to 6 weeks or longer to get better, especially if you are unable to limit your activities. CAUSES  Some cases of costochondritis have no known cause. Possible causes include:  Injury (trauma).  Exercise or activity such as lifting.  Severe coughing. SIGNS AND SYMPTOMS  Pain and tenderness in the chest and rib area.  Pain that gets worse when coughing or taking deep breaths.  Pain that gets worse with specific movements. DIAGNOSIS  Your health care provider will do a physical exam and ask about your symptoms. Chest X-rays or other tests may be done to rule out other problems. TREATMENT  Costochondritis usually goes away on its own over time. Your health care provider may prescribe medicine to help relieve pain. HOME CARE INSTRUCTIONS   Avoid exhausting physical activity. Try not to strain your ribs during normal activity. This would include any activities using chest, abdominal, and side muscles, especially if heavy weights are used.  Apply ice to the affected area for the first 2 days after the pain begins.  Put ice in a plastic bag.  Place a towel between your skin and the bag.  Leave the ice on for 20 minutes, 2 3 times a day.  Only take over-the-counter or prescription medicines as directed by your health care provider. SEEK MEDICAL CARE IF:  You have redness or swelling at the rib joints. These are signs of infection.  Your pain does  not go away despite rest or medicine. SEEK IMMEDIATE MEDICAL CARE IF:   Your pain increases or you are very uncomfortable.  You have shortness of breath or difficulty breathing.  You cough up blood.  You have worse chest pains, sweating, or vomiting.  You have a fever or persistent symptoms for more than 2 3 days.  You have a fever and your symptoms suddenly get worse. MAKE SURE YOU:   Understand these instructions.  Will watch your  condition.  Will get help right away if you are not doing well or get worse. Document Released: 05/01/2005 Document Revised: 05/12/2013 Document Reviewed: 02/23/2013 [facility] Patient Information 2014 [place].

## 2013-12-24 NOTE — ED Notes (Signed)
Pt c/o intermittent central chest pain that began this morning. Pt describes pain as sharp. Pt denies SOB, lightheadedness.

## 2013-12-24 NOTE — ED Notes (Signed)
Patient given discharge instruction, verbalized understand. IV removed, band aid applied. Patient ambulatory out of the department.  

## 2013-12-24 NOTE — ED Provider Notes (Signed)
CSN: [id number]     Arrival date & time 12/24/13  1306 History   First MD Initiated Contact with Patient 12/24/13 1309    This chart was scribed for No att. providers found by Terressa Koyanagi, ED Scribe. This patient was seen in room APA12/APA12 and the patient's care was started at 1:39 PM.    Chief Complaint  Patient presents with  . Chest Pain   HPI HPI Comments: [name] [name] is a 53 y.o. female who presents to the Emergency Department complaining of intermittent chest pains with associated palpatations onset this morning around 8:00AM. Pt describes the pain as a sharp and aching pain with a duration of 1-2 minutes. Pt reports the pain would go away completely and then return. Pt reports her last episode was at 11:30AM. Pt reports that her pain was not aggravated by deep breaths or movement. She states nothing she did make the pain come on or make it worse. Nothing she did made it better. She reports she has had about 4-5 episodes. Pt denies taking any measures to alleviate her pain; and further denies N/V, SOB, or diaphoresis. Pt denies smoking or drinking. She has never had this pain before. She denies any change in her activity. She states she did drink 2 cups of coffee today and set her usual 1.  Family history is negative for coronary artery disease  Pt denies any changes in her lifestyle recently, however, pt notes that she has been upset since Wednesday, 2 days ago because of something her preacher said during service.    PCP: Robert Bellow, MD  Past Medical History  Diagnosis Date  . Obsessive-compulsive disorders   . Postmenopausal bleeding   . Essential hypertension, benign   . Schizoaffective disorder   . BV (bacterial vaginosis) 01/28/2013   Past Surgical History  Procedure Laterality Date  . Bunionectomy     Family History  Problem Relation Age of Onset  . Diabetes type II Maternal Grandmother   . Diabetes Maternal Grandmother   . Hypertension Maternal Aunt     History  Substance Use Topics  . Smoking status: Never Smoker   . Smokeless tobacco: Never Used  . Alcohol Use: No   employed  OB History   Grav Para Term Preterm Abortions TAB SAB Ect Mult Living   1    1  1         Review of Systems  Constitutional: Negative for diaphoresis.  Respiratory: Negative for shortness of breath.   Cardiovascular: Positive for chest pain and palpitations.  Gastrointestinal: Negative for nausea and vomiting.      Allergies  Penicillins  Home Medications   Prior to Admission medications   Medication Sig Start Date End Date Taking? Authorizing Provider  betamethasone dipropionate (DIPROLENE) 0.05 % cream Apply 0.05 % topically 2 (two) times daily.    Historical Provider, MD  calcium citrate-vitamin D (CITRACAL+D) 315-200 MG-UNIT per tablet Take 1 tablet by mouth daily.    Historical Provider, MD  clomiPRAMINE (ANAFRANIL) 25 MG capsule Take 100 mg by mouth at bedtime.    Historical Provider, MD  divalproex (DEPAKOTE) 500 MG DR tablet Take 1,000 mg by mouth at bedtime.    Historical Provider, MD  FLUoxetine (PROZAC) 40 MG capsule Take 80 mg by mouth daily.    Historical Provider, MD  hydrochlorothiazide (HYDRODIURIL) 25 MG tablet Take 25 mg by mouth daily.    Historical Provider, MD  imipramine (TOFRANIL) 50 MG tablet  05/20/12   Historical Provider,  MD  medroxyPROGESTERone (PROVERA) 10 MG tablet Take 10 mg by mouth daily.    Historical Provider, MD  metroNIDAZOLE (FLAGYL) 500 MG tablet TAKE (1) TABLET BY MOUTH TWICE DAILY. 02/08/13   Estill Dooms, NP  Multiple Vitamin (MULTIVITAMIN) tablet Take 1 tablet by mouth daily.    Historical Provider, MD  ziprasidone (GEODON) 80 MG capsule Take 240 mg by mouth daily.    Historical Provider, MD   Triage Vitals: BP 106/74  Pulse 98  Temp(Src) 98.5 F (36.9 C) (Oral)  Resp 20  Ht 5\' 2"  (1.575 m)  Wt 181 lb (82.101 kg)  BMI 33.10 kg/m2  SpO2 95%  LMP 03/18/2012  Vital signs normal   Physical  Exam  Nursing note and vitals reviewed. Constitutional: She is oriented to person, place, and time. She appears well-developed and well-nourished.  Non-toxic appearance. She does not appear ill. No distress.  HENT:  Head: Normocephalic and atraumatic.  Right Ear: External ear normal.  Left Ear: External ear normal.  Nose: Nose normal. No mucosal edema or rhinorrhea.  Mouth/Throat: Oropharynx is clear and moist and mucous membranes are normal. No dental abscesses or uvula swelling.  Eyes: Conjunctivae and EOM are normal. Pupils are equal, round, and reactive to light.  Neck: Normal range of motion and full passive range of motion without pain. Neck supple.  Cardiovascular: Normal rate, regular rhythm and normal heart sounds.  Exam reveals no gallop and no friction rub.   No murmur heard. Pulmonary/Chest: Effort normal and breath sounds normal. No respiratory distress. She has no wheezes. She has no rhonchi. She has no rales. She exhibits no tenderness and no crepitus.    Abdominal: Soft. Normal appearance and bowel sounds are normal. She exhibits no distension. There is no tenderness. There is no rebound and no guarding.  Musculoskeletal: Normal range of motion. She exhibits no edema and no tenderness.  Tenderness mid CCJ  Neurological: She is alert and oriented to person, place, and time. She has normal strength. No cranial nerve deficit.  Skin: Skin is warm, dry and intact. No rash noted. No erythema. No pallor.  Psychiatric: She has a normal mood and affect. Her speech is normal and behavior is normal. Her mood appears not anxious.    ED Course  Procedures (including critical care time)  Medications  ibuprofen (ADVIL,MOTRIN) tablet 800 mg (800 mg Oral Given 12/24/13 1407)    DIAGNOSTIC STUDIES: Oxygen Saturation is 95% on RA, adequate by my interpretation.    COORDINATION OF CARE: 1:45 PM-Discussed treatment plan which includes EKG, f/u with cardiology, scheduling a stress test  with pt at bedside and pt agreed to plan.   Patient states her pain is better after the above medications. She was given her test results. Her pain was felt to be chest wall pain or costochondritis.  Labs Review Results for orders placed during the hospital encounter of 12/24/13  CBC WITH DIFFERENTIAL      Result Value Ref Range   WBC 6.3  4.0 - 10.5 K/uL   RBC 4.35  3.87 - 5.11 MIL/uL   Hemoglobin 13.2  12.0 - 15.0 g/dL   HCT 38.9  36.0 - 46.0 %   MCV 89.4  78.0 - 100.0 fL   MCH 30.3  26.0 - 34.0 pg   MCHC 33.9  30.0 - 36.0 g/dL   RDW 13.2  11.5 - 15.5 %   Platelets 265  150 - 400 K/uL   Neutrophils Relative % 54  43 -  77 %   Neutro Abs 3.4  1.7 - 7.7 K/uL   Lymphocytes Relative 36  12 - 46 %   Lymphs Abs 2.3  0.7 - 4.0 K/uL   Monocytes Relative 7  3 - 12 %   Monocytes Absolute 0.5  0.1 - 1.0 K/uL   Eosinophils Relative 2  0 - 5 %   Eosinophils Absolute 0.1  0.0 - 0.7 K/uL   Basophils Relative 1  0 - 1 %   Basophils Absolute 0.0  0.0 - 0.1 K/uL  COMPREHENSIVE METABOLIC PANEL      Result Value Ref Range   Sodium 138  137 - 147 mEq/L   Potassium 3.8  3.7 - 5.3 mEq/L   Chloride 98  96 - 112 mEq/L   CO2 27  19 - 32 mEq/L   Glucose, Bld 99  70 - 99 mg/dL   BUN 14  6 - 23 mg/dL   Creatinine, Ser 0.70  0.50 - 1.10 mg/dL   Calcium 9.6  8.4 - 10.5 mg/dL   Total Protein 7.3  6.0 - 8.3 g/dL   Albumin 3.8  3.5 - 5.2 g/dL   AST 23  0 - 37 U/L   ALT 15  0 - 35 U/L   Alkaline Phosphatase 65  39 - 117 U/L   Total Bilirubin 0.2 (*) 0.3 - 1.2 mg/dL   GFR calc non Af Amer >90  >90 mL/min   GFR calc Af Amer >90  >90 mL/min  TROPONIN I      Result Value Ref Range   Troponin I <0.30  <0.30 ng/mL  I-STAT TROPOININ, ED      Result Value Ref Range   Troponin i, poc 0.00  0.00 - 0.08 ng/mL   Comment 3             Laboratory interpretation all normal   Imaging Review Dg Chest 2 View  12/24/2013   CLINICAL DATA:  Chest pain  EXAM: CHEST  2 VIEW  COMPARISON:  None.  FINDINGS:  Cardiomediastinal silhouette is stable. No acute infiltrate or pleural effusion. No pulmonary edema. Mild degenerative changes thoracic spine.  IMPRESSION: No active cardiopulmonary disease.   Electronically Signed   By: Lahoma Crocker M.D.   On: 12/24/2013 14:33     EKG Interpretation   Date/Time:  Friday Dec 24 2013 13:16:58 EDT Ventricular Rate:  99 PR Interval:  137 QRS Duration: 99 QT Interval:  368 QTC Calculation: 472 R Axis:   19 Text Interpretation:  Sinus rhythm RSR' in V1 or V2, right VCD or RVH  Nonspecific ST and T wave abnormality NO LONGER PRESENT Since last tracing  18 Apr 2012 Confirmed by [facility]  MD-I, Nhung Danko ([id number]) on 12/24/2013 1:46:54 PM      MDM   Final diagnoses:  Chest wall pain  Costochondritis    Discharge Medication List as of 12/24/2013  5:35 PM    START taking these medications   Details  cyclobenzaprine (FLEXERIL) 5 MG tablet Take 1 tablet (5 mg total) by mouth 3 (three) times daily as needed (muscle soreness)., Starting 12/24/2013, Until Discontinued, Print    naproxen sodium (ANAPROX DS) 550 MG tablet Take 1 tablet (550 mg total) by mouth 2 (two) times daily with a meal., Starting 12/24/2013, Until Discontinued, Print        Rolland Porter, MD, FACEP   I personally performed the services described in this documentation, which was scribed in my presence. The recorded information has been  reviewed and considered.      Janice Norrie, MD 12/24/13 2148

## 2013-12-31 ENCOUNTER — Ambulatory Visit (INDEPENDENT_AMBULATORY_CARE_PROVIDER_SITE_OTHER): Payer: 59 | Admitting: Psychiatry

## 2013-12-31 DIAGNOSIS — F411 Generalized anxiety disorder: Secondary | ICD-10-CM

## 2013-12-31 DIAGNOSIS — F329 Major depressive disorder, single episode, unspecified: Secondary | ICD-10-CM
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
## 2013-12-31 NOTE — Patient Instructions (Signed)
Discussed orally 

## 2014-01-11 ENCOUNTER — Encounter: Payer: Self-pay | Admitting: Obstetrics & Gynecology

## 2014-01-11 ENCOUNTER — Other Ambulatory Visit (HOSPITAL_COMMUNITY)
Admission: RE | Admit: 2014-01-11 | Discharge: 2014-01-11 | Disposition: A | Discharge status: Home / Self Care | Payer: 59 | Source: Ambulatory Visit | Attending: Obstetrics & Gynecology | Admitting: Obstetrics & Gynecology

## 2014-01-11 ENCOUNTER — Ambulatory Visit (INDEPENDENT_AMBULATORY_CARE_PROVIDER_SITE_OTHER): Payer: 59 | Admitting: Obstetrics & Gynecology

## 2014-01-11 VITALS — BP 100/70 | Ht 62.0 in | Wt 184.4 lb

## 2014-01-11 DIAGNOSIS — Z1212 Encounter for screening for malignant neoplasm of rectum: Secondary | ICD-10-CM

## 2014-01-11 DIAGNOSIS — Z1151 Encounter for screening for human papillomavirus (HPV): Secondary | ICD-10-CM | POA: Insufficient documentation

## 2014-01-11 DIAGNOSIS — Z01419 Encounter for gynecological examination (general) (routine) without abnormal findings: Secondary | ICD-10-CM

## 2014-01-11 NOTE — Progress Notes (Signed)
Patient ID: [name] [name], female   DOB: 12-23-1960, 53 y.o.   MRN: [id number] Subjective:     [name] [name] is a 53 y.o. female here for a routine exam.  Patient's last menstrual period was 03/18/2012. No bleeding since then [id number] Birth Control Method:  na Menstrual Calendar(currently): na  Current complaints: none.   Current acute medical issues:  none   Recent Gynecologic History Patient's last menstrual period was 03/18/2012. Last Pap: 2014,  normal Last mammogram: 09/2013,  normal  Past Medical History  Diagnosis Date  . Obsessive-compulsive disorders   . Postmenopausal bleeding   . Essential hypertension, benign   . Schizoaffective disorder   . BV (bacterial vaginosis) 01/28/2013    Past Surgical History  Procedure Laterality Date  . Bunionectomy      OB History   Grav Para Term Preterm Abortions TAB SAB Ect Mult Living   1    1  1          History   Social History  . Marital Status: Divorced    Spouse Name: N/A    Number of Children: N/A  . Years of Education: N/A   Social History Main Topics  . Smoking status: Never Smoker   . Smokeless tobacco: Never Used  . Alcohol Use: No  . Drug Use: No  . Sexual Activity: Not Currently    Birth Control/ Protection: Post-menopausal   Other Topics Concern  . None   Social History Narrative  . None    Family History  Problem Relation Age of Onset  . Diabetes type II Maternal Grandmother   . Diabetes Maternal Grandmother   . Hypertension Maternal Aunt      Review of Systems  Review of Systems  Constitutional: Negative for fever, chills, weight loss, malaise/fatigue and diaphoresis.  HENT: Negative for hearing loss, ear pain, nosebleeds, congestion, sore throat, neck pain, tinnitus and ear discharge.   Eyes: Negative for blurred vision, double vision, photophobia, pain, discharge and redness.  Respiratory: Negative for cough, hemoptysis, sputum production, shortness of breath, wheezing and stridor.    Cardiovascular: Negative for chest pain, palpitations, orthopnea, claudication, leg swelling and PND.  Gastrointestinal: negative for abdominal pain. Negative for heartburn, nausea, vomiting, diarrhea, constipation, blood in stool and melena.  Genitourinary: Negative for dysuria, urgency, frequency, hematuria and flank pain.  Musculoskeletal: Negative for myalgias, back pain, joint pain and falls.  Skin: Negative for itching and rash.  Neurological: Negative for dizziness, tingling, tremors, sensory change, speech change, focal weakness, seizures, loss of consciousness, weakness and headaches.  Endo/Heme/Allergies: Negative for environmental allergies and polydipsia. Does not bruise/bleed easily.  Psychiatric/Behavioral: Negative for depression, suicidal ideas, hallucinations, memory loss and substance abuse. The patient is not nervous/anxious and does not have insomnia.        Objective:    Physical Exam  Vitals reviewed. Constitutional: She is oriented to person, place, and time. She appears well-developed and well-nourished.  HENT:  Head: Normocephalic and atraumatic.        Right Ear: External ear normal.  Left Ear: External ear normal.  Nose: Nose normal.  Mouth/Throat: Oropharynx is clear and moist.  Eyes: Conjunctivae and EOM are normal. Pupils are equal, round, and reactive to light. Right eye exhibits no discharge. Left eye exhibits no discharge. No scleral icterus.  Neck: Normal range of motion. Neck supple. No tracheal deviation present. No thyromegaly present.  Cardiovascular: Normal rate, regular rhythm, normal heart sounds and intact distal pulses.  Exam reveals no gallop  and no friction rub.   No murmur heard. Respiratory: Effort normal and breath sounds normal. No respiratory distress. She has no wheezes. She has no rales. She exhibits no tenderness.  GI: Soft. Bowel sounds are normal. She exhibits no distension and no mass. There is no tenderness. There is no rebound and  no guarding.  Genitourinary:  Breasts no masses skin changes or nipple changes bilaterally      Vulva is normal without lesions Vagina is pink moist without discharge Cervix normal in appearance and pap is done Uterus is normal size shape and contour Adnexa is negative with normal sized ovaries  Rectal    hemoccult negative, normal tone, no masses  Musculoskeletal: Normal range of motion. She exhibits no edema and no tenderness.  Neurological: She is alert and oriented to person, place, and time. She has normal reflexes. She displays normal reflexes. No cranial nerve deficit. She exhibits normal muscle tone. Coordination normal.  Skin: Skin is warm and dry. No rash noted. No erythema. No pallor.  Psychiatric: She has a normal mood and affect. Her behavior is normal. Judgment and thought content normal.       Assessment:    Healthy female exam.    Plan:    Follow up in: 1 year.

## 2014-01-12 LAB — CYTOLOGY - PAP

## 2014-01-18 ENCOUNTER — Ambulatory Visit (INDEPENDENT_AMBULATORY_CARE_PROVIDER_SITE_OTHER): Payer: 59 | Admitting: Psychiatry

## 2014-01-18 DIAGNOSIS — F259 Schizoaffective disorder, unspecified: Secondary | ICD-10-CM

## 2014-01-18 NOTE — Patient Instructions (Signed)
Discussed orally 

## 2014-01-18 NOTE — Progress Notes (Signed)
   THERAPIST PROGRESS NOTE  Session Time: Tuesday 01/18/2014  Participation Level: Active  Behavioral Response: CasualAlertAnxious  Type of Therapy: Individual Therapy  Treatment Goals addressed:  Improve assertiveness skills, improve impulse, control, improve ability to manage stress and anxiety  Interventions: CBT and Supportive  Summary: [name] [name] is a 53 y.o. female who presents with a long-standing history of symptoms of anxiety and depression and reports a history of at least 3 psychiatric hospitalizations from 42 through 1990. Patient 's symptoms have included depressed mood, tearfulness, anxiety, insomnia, guilt, and excessive worrying.  Since last session, patient has seen psychiatrist Dr. Osborne Casco. Patient reports visit went well and there are no changes in her medication. Patient continues to experience guilt about not attending church and cites contact with her aunt as one of the factors preventing her from going to church because she wants patient to eat dinner with her. Patient continues to struggle with assertiveness skills. She expresses guilt about recently deciding not to go with her aunt and uncle to dinner. Uncle recently had surgery to remove knot above bladder and now cancer has spread per patient's report. She blames self for this saying if she had gone to dinner, the cancer would not have spread. She also reports financial stress but is working with case Freight forwarder at [employer] for financial planning. She also continues to work at [employer].   Suicidal/Homicidal: No  Therapist Response: Patient works with patient to process feelings, identify ways to improve assertiveness skills, identify and challenge cognitive distortions, identify coping statements    Plan: Return again in 3 weeks.  Diagnosis: Axis I: Schizoaffective Disorder    Axis II: Deferred    [name] Wulf, LCSW 01/18/2014

## 2014-01-19 ENCOUNTER — Telehealth (HOSPITAL_COMMUNITY): Payer: Self-pay | Admitting: *Deleted

## 2014-01-20 NOTE — Telephone Encounter (Signed)
Therapist returned patient's call and left message call had been returned.

## 2014-01-21 ENCOUNTER — Telehealth (HOSPITAL_COMMUNITY): Payer: Self-pay | Admitting: *Deleted

## 2014-02-08 ENCOUNTER — Ambulatory Visit (HOSPITAL_COMMUNITY): Payer: Self-pay | Admitting: Psychiatry

## 2014-02-08 ENCOUNTER — Encounter (HOSPITAL_COMMUNITY): Payer: Self-pay | Admitting: Psychiatry

## 2014-02-24 ENCOUNTER — Ambulatory Visit (INDEPENDENT_AMBULATORY_CARE_PROVIDER_SITE_OTHER): Payer: 59 | Admitting: Psychiatry

## 2014-02-24 DIAGNOSIS — F259 Schizoaffective disorder, unspecified: Secondary | ICD-10-CM

## 2014-02-24 DIAGNOSIS — F251 Schizoaffective disorder, depressive type: Secondary | ICD-10-CM

## 2014-02-24 NOTE — Patient Instructions (Signed)
Discussed orally 
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
## 2014-02-28 ENCOUNTER — Ambulatory Visit (HOSPITAL_COMMUNITY): Payer: Self-pay | Admitting: Psychiatry

## 2014-03-14 ENCOUNTER — Ambulatory Visit (HOSPITAL_COMMUNITY): Payer: Self-pay | Admitting: Psychiatry

## 2014-04-01 ENCOUNTER — Ambulatory Visit (HOSPITAL_COMMUNITY): Payer: Self-pay | Admitting: Psychiatry

## 2014-04-05 ENCOUNTER — Ambulatory Visit (INDEPENDENT_AMBULATORY_CARE_PROVIDER_SITE_OTHER): Payer: 59 | Admitting: Psychiatry

## 2014-04-05 DIAGNOSIS — F259 Schizoaffective disorder, unspecified: Secondary | ICD-10-CM

## 2014-04-05 DIAGNOSIS — F251 Schizoaffective disorder, depressive type: Secondary | ICD-10-CM

## 2014-04-05 NOTE — Patient Instructions (Signed)
Discussed orally 
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
## 2014-04-25 ENCOUNTER — Ambulatory Visit (INDEPENDENT_AMBULATORY_CARE_PROVIDER_SITE_OTHER): Payer: 59 | Admitting: Psychiatry

## 2014-04-25 DIAGNOSIS — F251 Schizoaffective disorder, depressive type: Secondary | ICD-10-CM

## 2014-04-25 DIAGNOSIS — F259 Schizoaffective disorder, unspecified: Secondary | ICD-10-CM

## 2014-04-25 NOTE — Patient Instructions (Signed)
Discussed orally 
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
## 2014-05-18 ENCOUNTER — Ambulatory Visit (INDEPENDENT_AMBULATORY_CARE_PROVIDER_SITE_OTHER): Payer: 59 | Admitting: Psychiatry

## 2014-05-18 DIAGNOSIS — F251 Schizoaffective disorder, depressive type: Secondary | ICD-10-CM

## 2014-05-18 NOTE — Progress Notes (Signed)
   THERAPIST PROGRESS NOTE  Session Time: Wednesday 05/18/2014 10:15 AM - 11:05 AM  Participation Level: Active  Behavioral Response: CasualAlert/anxious/less depressed   Type of Therapy: Individual Therapy  Treatment Goals addressed: Improve ability to manage stress and anxiety, improve assertiveness skills and ability to set/maintain boundaries   Interventions: CBT and Supportive  Summary: [name] [name] is a 53 y.o. female who presents with a long-standing history of symptoms of anxiety and depression, a previous diagnosis of schizoaffective disorder,  and reports a history of at least 3 psychiatric hospitalizations from 80 through 1990.  Patient 's symptoms have included depressed mood, tearfulness, anxiety, insomnia, guilt, and excessive worrying.   Patient reports doing well since last session except for this past Monday when she felt really down and had negative thoughts about self. She experienced passive SI with no intent and no plan. She can't identify any triggers. However she reports feeling much better today. She is pleased that she was assertive with her cousin and told him he needed to ask other people for transportation. She continues to struggle with being assertive with ex-husband. She reports some anxiety regarding forgetting tasks at work. She plans to discuss this with psychiatrist Dr. Nila Nephew at [facility] during her appointment on 06/29/2014. Patient has made successful efforts to become more active and is pleased she attended church. She continues to have inappropriate guilt at times and reports recent incident of going to church and feeling guilty about wearing a watch as she did not wear the watch the previous Wednesday night.    Suicidal/Homicidal: Patient reports passive SI Monday with no intent and no plan. She denies any current SI. She agrees to call this practice, call hotline # provided by Dr. Nila Nephew, call 911, or have someone take her to the emergency room should  symptoms worsen.  Therapist Response: Therapist works with patient to develop compensatory tool for memory to complete tasks, identify distracting activities, discuss connection between thoughts, mood, behavior, identify thoughts that block and thoughts that help effective assertion.  Plan: Return again in 3 weeks. Patient agrees to complete plan your day handouts and bring to next session.  Diagnosis: Axis I: Schizoaffective Disorder    Axis II: No diagnosis    Marylu Dudenhoeffer, LCSW 05/18/2014

## 2014-05-18 NOTE — Patient Instructions (Signed)
Discussed orally 

## 2014-05-24 ENCOUNTER — Emergency Department (HOSPITAL_COMMUNITY): Payer: 59

## 2014-05-24 ENCOUNTER — Encounter (HOSPITAL_COMMUNITY): Payer: Self-pay | Admitting: Emergency Medicine

## 2014-05-24 ENCOUNTER — Emergency Department (HOSPITAL_COMMUNITY)
Admission: EM | Admit: 2014-05-24 | Discharge: 2014-05-24 | Disposition: A | Discharge status: Home / Self Care | Payer: 59 | Attending: Emergency Medicine | Admitting: Emergency Medicine

## 2014-05-24 DIAGNOSIS — Y93K1 Activity, walking an animal: Secondary | ICD-10-CM | POA: Diagnosis not present

## 2014-05-24 DIAGNOSIS — S6992XA Unspecified injury of left wrist, hand and finger(s), initial encounter: Secondary | ICD-10-CM

## 2014-05-24 DIAGNOSIS — S62615A Displaced fracture of proximal phalanx of left ring finger, initial encounter for closed fracture: Secondary | ICD-10-CM | POA: Diagnosis present

## 2014-05-24 DIAGNOSIS — Y999 Unspecified external cause status: Secondary | ICD-10-CM | POA: Diagnosis not present

## 2014-05-24 DIAGNOSIS — Y929 Unspecified place or not applicable: Secondary | ICD-10-CM | POA: Diagnosis not present

## 2014-05-24 DIAGNOSIS — W541XXA Struck by dog, initial encounter: Secondary | ICD-10-CM | POA: Insufficient documentation

## 2014-05-24 DIAGNOSIS — S63285A Dislocation of proximal interphalangeal joint of left ring finger, initial encounter: Secondary | ICD-10-CM | POA: Diagnosis not present

## 2014-05-24 DIAGNOSIS — I10 Essential (primary) hypertension: Secondary | ICD-10-CM | POA: Insufficient documentation

## 2014-05-24 MED ORDER — BUPIVACAINE HCL (PF) 0.25 % IJ SOLN
20.0000 mL | Freq: Once | INTRAMUSCULAR | Status: AC
  Administered 2014-05-24: 20 mL
  Filled 2014-05-24: qty 30

## 2014-05-24 MED ORDER — SULFAMETHOXAZOLE-TMP DS 800-160 MG PO TABS: 1.0000 | ORAL_TABLET | Freq: Two times a day (BID) | ORAL | Status: DC

## 2014-05-24 MED ORDER — HYDROCODONE-ACETAMINOPHEN 5-325 MG PO TABS: 2.0000 | ORAL_TABLET | Freq: Four times a day (QID) | ORAL | Status: DC | PRN

## 2014-05-24 MED ORDER — LIDOCAINE HCL (PF) 1 % IJ SOLN
5.0000 mL | Freq: Once | INTRAMUSCULAR | Status: AC
  Administered 2014-05-24: 5 mL via INTRADERMAL
  Filled 2014-05-24: qty 5

## 2014-05-24 MED ORDER — TETANUS-DIPHTH-ACELL PERTUSSIS 5-2.5-18.5 LF-MCG/0.5 IM SUSP
0.5000 mL | Freq: Once | INTRAMUSCULAR | Status: AC
  Administered 2014-05-24: 0.5 mL via INTRAMUSCULAR
  Filled 2014-05-24: qty 0.5

## 2014-05-24 MED ORDER — LIDOCAINE HCL (PF) 1 % IJ SOLN
INTRAMUSCULAR | Status: AC
  Filled 2014-05-24: qty 5

## 2014-05-24 MED ORDER — HYDROCODONE-ACETAMINOPHEN 5-325 MG PO TABS
1.0000 | ORAL_TABLET | Freq: Once | ORAL | Status: AC
  Administered 2014-05-24: 1 via ORAL
  Filled 2014-05-24: qty 1

## 2014-05-24 NOTE — ED Notes (Signed)
Pt says she was knocked down by her dog, injury to LMF

## 2014-05-24 NOTE — ED Provider Notes (Signed)
CSN: [id number]     Arrival date & time 05/24/14  1110 History  This chart was scribed for non-physician practitioner Lenard Galloway, working with Sharyon Cable, MD by Ludger Nutting, ED Scribe. This patient was seen in room APFT24/APFT24 and the patient's care was started at 11:50 AM.    Chief Complaint  Patient presents with  . Finger Injury   The history is provided by the patient. No language interpreter was used.    HPI Comments: [name] [name] is a 53 y.o. female who presents to the Emergency Department complaining of a laceration and injury to the left middle finger that occurred this morning. Patient states she was walking when she tripped over her dog. She is unsure of the exact mechanism of injury. She has constant associated pain to the affected area. She denies any other injuries at this time. She does not take anti-coagulants. She is unsure of last tetanus update.   Past Medical History  Diagnosis Date  . Obsessive-compulsive disorders   . Postmenopausal bleeding   . Essential hypertension, benign   . Schizoaffective disorder   . BV (bacterial vaginosis) 01/28/2013   Past Surgical History  Procedure Laterality Date  . Bunionectomy     Family History  Problem Relation Age of Onset  . Diabetes type II Maternal Grandmother   . Diabetes Maternal Grandmother   . Hypertension Maternal Aunt    History  Substance Use Topics  . Smoking status: Never Smoker   . Smokeless tobacco: Never Used  . Alcohol Use: No   OB History   Grav Para Term Preterm Abortions TAB SAB Ect Mult Living   1    1  1         Review of Systems  Musculoskeletal: Positive for arthralgias.  Skin: Positive for wound.  Neurological: Negative for numbness.      Allergies  Penicillins  Home Medications   Prior to Admission medications   Medication Sig Start Date End Date Taking? Authorizing Provider  calcium citrate-vitamin D (CITRACAL+D) 315-200 MG-UNIT per tablet Take 2 tablets by  mouth daily.    Yes Historical Provider, MD  divalproex (DEPAKOTE) 500 MG DR tablet Take 500 mg by mouth 2 (two) times daily.    Yes Historical Provider, MD  FLUoxetine (PROZAC) 40 MG capsule Take 40 mg by mouth daily.    Yes Historical Provider, MD  hydrochlorothiazide (HYDRODIURIL) 25 MG tablet Take 25 mg by mouth daily.   Yes Historical Provider, MD  ibuprofen (ADVIL,MOTRIN) 200 MG tablet Take 400 mg by mouth 2 (two) times daily as needed for headache.   Yes Historical Provider, MD  imipramine (TOFRANIL) 50 MG tablet Take 50 mg by mouth at bedtime.  05/20/12  Yes Historical Provider, MD  lisinopril (PRINIVIL,ZESTRIL) 5 MG tablet Take 5 mg by mouth daily.   Yes Historical Provider, MD  Multiple Vitamin (MULTIVITAMIN) tablet Take 1 tablet by mouth daily.   Yes Historical Provider, MD  ziprasidone (GEODON) 80 MG capsule Take 80 mg by mouth 2 (two) times daily with a meal.    Yes Historical Provider, MD   BP 147/68  Pulse 104  Temp(Src) 98.4 F (36.9 C) (Oral)  Resp 20  Ht 5\' 2"  (1.575 m)  Wt 184 lb (83.462 kg)  BMI 33.65 kg/m2  SpO2 100%  LMP 03/18/2012 Physical Exam  Nursing note and vitals reviewed. Constitutional: She is oriented to person, place, and time. She appears well-developed and well-nourished.  HENT:  Head: Normocephalic and  atraumatic.  Cardiovascular: Normal rate.   Pulmonary/Chest: Effort normal.  Neurological: She is alert and oriented to person, place, and time.  Skin: Skin is warm and dry. Laceration noted.  Linear transverse laceration near PIP joint on palmar surface with exposed tendon. Moderate swelling of the finger. Decreased ability to flexion.   Psychiatric: She has a normal mood and affect.    ED Course  Procedures (including critical care time)  DIAGNOSTIC STUDIES: Oxygen Saturation is 100% on RA, normal by my interpretation.    COORDINATION OF CARE: 11:55 AM Will order XRAY of the left middle finger. Discussed treatment plan with pt at bedside and  pt agreed to plan.   Labs Review Labs Reviewed - No data to display  Imaging Review No results found.   EKG Interpretation None     Left 3rd finger anesthetized with .25% marcaine via digital block. Wound irrigated with betadine and saline. Explored with forceps without visualized tendon rupture.   MDM   Final diagnoses:  None    1. Left 3rd finger injury 2. Dislocated finger  Discussed injury with Dr. Amedeo Plenty (hand ortho) who advises patient to transfer to [facility] ED for further management by him. Wet-to-dry bulky dressing applied to finger. Tetanus updated. Patient given instructions, EMTALA completed. Discussed with Kenney Houseman (nurse first at [facility] ED) who is aware Dr. Amedeo Plenty is to be contacted immediately upon arrival.   I personally performed the services described in this documentation, which was scribed in my presence. The recorded information has been reviewed and is accurate.     [name], PA-C 05/24/14 1423

## 2014-05-24 NOTE — Progress Notes (Signed)
Orthopedic Tech Progress Note Patient Details:  [name] [name] 07-20-1961 [id number]  Ortho Devices Type of Ortho Device: Ace wrap;Volar splint Ortho Device/Splint Interventions: Ordered;Application   Braulio Bosch 05/24/2014, 4:52 PM

## 2014-05-24 NOTE — ED Notes (Signed)
Dr Amedeo Plenty, MD at bedside.

## 2014-05-24 NOTE — Consult Note (Signed)
  See Dictation# [id number] Amedeo Plenty MD

## 2014-05-24 NOTE — Consult Note (Signed)
Reason for Consult: Crush injury left hand with left middle finger PIP dislocation Referring Physician: [name] is an 53 y.o. female.  HPI: Patient presents with a open dislocation PIP joint left middle finger and crushing injury to the index and ring finger. She was walking her dog and unfortunately failed thereafter. She denies neck back chest or abdominal pain she denies prior injury to the hand she's very pleasant female paragraph she was seen [facility] for for further definitive care for hand surgery.   Patient is awake alert and oriented. She is here with her friend. They arrived by private vehicle. She denies other injury. Past Medical History  Diagnosis Date  . Obsessive-compulsive disorders   . Postmenopausal bleeding   . Essential hypertension, benign   . Schizoaffective disorder   . BV (bacterial vaginosis) 01/28/2013    Past Surgical History  Procedure Laterality Date  . Bunionectomy      Family History  Problem Relation Age of Onset  . Diabetes type II Maternal Grandmother   . Diabetes Maternal Grandmother   . Hypertension Maternal Aunt     Social History:  reports that she has never smoked. She has never used smokeless tobacco. She reports that she does not drink alcohol or use illicit drugs.  Allergies:  Allergies  Allergen Reactions  . Penicillins Rash    Medications: I have reviewed the patient's current medications.  No results found for this or any previous visit (from the past 48 hour(s)).  Dg Finger Middle Left  05/24/2014   CLINICAL DATA:  Tripped over cat and cut hand on [name]  EXAM: LEFT MIDDLE FINGER 2+V  COMPARISON:  None.  FINDINGS: There is dislocation of the proximal interphalangeal joint of the third digit. There is dorsal dislocation of the middle phalanx. There is a small avulsion fragment adjacent to the joint measuring 1.5 mm.  IMPRESSION: 1. Dislocation of the proximal interphalangeal joint of the third digit.  2. Small adjacent avulsion fracture.   Electronically Signed   By: Suzy Bouchard M.D.   On: 05/24/2014 12:50    Review of Systems  HENT: Negative.   Cardiovascular: Negative.   Gastrointestinal: Negative.   Genitourinary: Negative.   Neurological: Negative.   Endo/Heme/Allergies: Negative.   Psychiatric/Behavioral: Negative.    Blood pressure 122/67, pulse 95, temperature 98.2 F (36.8 C), temperature source Oral, resp. rate 18, height 5\' 2"  (1.575 m), weight 83.462 kg (184 lb), last menstrual period 03/18/2012, SpO2 100.00%. Physical Exam Patient has a open PIP fracture dislocation with exposed flexor tendon. Paragraph she has a previous block placed at [facility] the sensation is difficult to ascertain.  Patient has crush injury to the index and ring finger. There is a large amount of hematoma and swelling. Paragraph she doesn't refill to all fingertips about the left hand. Paragraph her wrist is nontender elbow forearm and shoulder are stable left upper extremity  Chest is clear paragraph abdomen is nontender nondistended  Patient has normal lower extremity examination. Neck and back are nontender. Right upper extremity is neurovascular intact. Assessment/Plan: Crush injury left hand with open fracture dislocation left middle finger PIP joint. She has exposed flexor tendon.  I consented her for surgery program plan for I&D repair reconstruction is necessary. She understands risk benefits these don'ts and all questions have been addressed.  We are planning surgery for your upper extremity. The risk and benefits of surgery include risk of bleeding infection anesthesia damage to normal structures and  failure of the surgery to accomplish its intended goals of relieving symptoms and restoring function with this in mind we'll going to proceed. I have specifically discussed with the patient the pre-and postoperative regime and the does and don'ts and risk and benefits in great detail. Risk  and benefits of surgery also include risk of dystrophy chronic nerve pain failure of the healing process to go onto completion and other inherent risks of surgery The relavent the pathophysiology of the disease/injury process, as well as the alternatives for treatment and postoperative course of action has been discussed in great detail with the patient who desires to proceed.  We will do everything in our power to help you (the patient) restore function to the upper extremity. Is a pleasure to see this patient today.   [name] [name] 05/24/2014, 4:47 PM

## 2014-05-24 NOTE — ED Notes (Signed)
Pt fell on cement while walking a dog, pt with lac to left middle finger

## 2014-05-24 NOTE — ED Provider Notes (Signed)
MSE was initiated and I personally evaluated the patient and placed orders (if any) at  3:28 PM on May 24, 2014.  The patient appears stable so that the remainder of the MSE may be completed by another provider.  Patient presents from [facility] with an open finger dislocation and fracture that that occurred today. Patient presents to see Dr. Amedeo Plenty. Patient is stable at this time. I will page Dr. Amedeo Plenty.  3:40 PM Dr. Amedeo Plenty here to see the patient.   Alvina Chou, PA-C 05/25/14 [id number]

## 2014-05-24 NOTE — Discharge Instructions (Signed)
°  Please elevate her hand.  You will need to be out of work until further notice.  Please take the medicine for antibiotic purposes until it is complete Please take the medicine for pain as needed Please keep your bandage clean and dry  Keep bandage clean and dry.  Call for any problems.  No smoking.  Criteria for driving a car: you should be off your pain medicine for 7-8 hours, able to drive one handed(confident), thinking clearly and feeling able in your judgement to drive. Continue elevation as it will decrease swelling.  If instructed by MD move your fingers within the confines of the bandage/splint.  Use ice if instructed by your MD. Call immediately for any sudden loss of feeling in your hand/arm or change in functional abilities of the extremity.  We recommend that you to take vitamin C 1000 mg a day to promote healing we also recommend that if you require her pain medicine that he take a stool softener to prevent constipation as most pain medicines will have constipation side effects. We recommend either Peri-Colace or Senokot and recommend that you also consider adding MiraLAX to prevent the constipation affects from pain medicine if you are required to use them. These medicines are over the counter and maybe purchased at a local pharmacy.

## 2014-05-24 NOTE — ED Provider Notes (Signed)
Patient seen/examined in the Emergency Department in conjunction with Midlevel Provider Lucas Patient reports left finger injury after fall Pt is awake/alert, no distress.  Wound has been cleansed by PA Will be transferred to [facility] for definitive management by hand surgery   Sharyon Cable, MD 05/24/14 [phone]

## 2014-05-25 NOTE — ED Provider Notes (Signed)
Medical screening examination/treatment/procedure(s) were conducted as a shared visit with non-physician practitioner(s) and myself.  I personally evaluated the patient during the encounter.   EKG Interpretation None        Sharyon Cable, MD 05/25/14 [phone]

## 2014-05-25 NOTE — Op Note (Signed)
[name], [name] NO.:  [id number]  MEDICAL RECORD NO.:  [id number]  LOCATION:  TR10C                        FACILITY:  [facility]  PHYSICIAN:  [name] [name], M.D.DATE OF BIRTH:  [birth year]  DATE OF PROCEDURE: DATE OF DISCHARGE:  05/24/2014                              OPERATIVE REPORT   PREOPERATIVE DIAGNOSIS:  Crush injury, left hand with fracture dislocation open in nature;  left middle finger PIP joint.  POSTOPERATIVE DIAGNOSIS:  Crush injury, left hand with fracture dislocation open in nature;  left middle finger PIP joint.  PROCEDURE: 1. I and D of skin, subcutaneous tissue, tendon, and deep tissue of     left middle finger secondary to open PIP fracture dislocation. 2. AP, lateral, and oblique an x-ray performed exam and interpreted by     myself, left hand. 3. Flexor tenolysis tenosynovectomy, left middle finger. 4. Open treatment of PIP fracture dislocation, left middle finger with     reduction.  SURGEON:  [name] [name], M.D..  ASSISTANT:  None.  COMPLICATIONS:  None.  ANESTHESIA:  Intermetacarpal/field block.  TOURNIQUET TIME:  0.  INDICATIONS:  Pleasant 53 year old female who was sent down from [facility] for definitive care.  She understands risks, benefits, timeframe and duration of recovery dos and don'ts, etc.  With all issues in mind, we will proceed accordingly with surgical avenues of care as discussed.  OPERATION DETAILS:  The patient was seen by myself and Anesthesia, taken to the operative suite, and underwent a smooth induction of the flexor tendon sheath block. She then underwent sterile prep and drape.  I scrubbed her with 3 separate Betadine scrubs, followed by I and D of skin, subcutaneous tissue, and tendon tissue.  This was done without difficulty.  I  placed 3 L of fluid through and through the open wound volarly.  Following this, sterile field was then again secured and the patient underwent reduction of  the PIP dislocation.  Following this, I then performed a flexor tenolysis, tenosynovectomy, ensuring that the FDP and FDS tendons were intact.  They were.  I also looked at the neurovascular bundles which appeared to be stable.  At this time, I brought in live fluoro and performed AP, lateral, and oblique x-rays of the hand, the index finger, middle finger, and ring finger; all were nicely reduced.  I feel she likely has small chip fractures at the volar regions of the middle phalanx, bones of the index and ring finger.  Permanent copy x-rays were taken.  Following this, I then closed the wound with interrupted Prolene.  The patient tolerated this well.  The patient was monitored and did have a tetanus shot at [facility].  Given her penicillin allergy, I am going to place her on Bactrim DS 1 p.o. b.i.d. x10 days.  She tolerated the procedure well.  A splint was placed.  She looked very nice at the conclusion of the procedure.  She can be monitored as an outpatient.  I will have her begin aggressive range of motion with therapy.  I am going to need dorsal blocking splint to restrict the last 10 degrees of extension about the PIP joints and  work aggressively on flexion and extension.  Our main problem is going to be stiffness in the PIP joints of the index, middle, and ring finger given the tremendous injury she had today and aggressive contusive aspects of her exam. She understands this __________.  I we will keep her out of work until further notice.  We went over these issues at length.  It is a pleasure see her today.  Dos and don'ts discussed and all questions addressed.     [name] [name]. [name] [name], M.D.     [facility]  D:  05/24/2014  T:  05/25/2014  Job:  [id number]

## 2014-05-25 NOTE — ED Provider Notes (Signed)
Medical screening examination/treatment/procedure(s) were performed by non-physician practitioner and as supervising physician I was immediately available for consultation/collaboration.   EKG Interpretation None        Fredia Sorrow, MD 05/25/14 [id number]

## 2014-06-06 ENCOUNTER — Encounter (HOSPITAL_COMMUNITY): Payer: Self-pay | Admitting: Emergency Medicine

## 2014-06-09 ENCOUNTER — Ambulatory Visit (INDEPENDENT_AMBULATORY_CARE_PROVIDER_SITE_OTHER): Payer: 59 | Admitting: Psychiatry

## 2014-06-09 DIAGNOSIS — F251 Schizoaffective disorder, depressive type: Secondary | ICD-10-CM

## 2014-06-10 NOTE — Patient Instructions (Signed)
Discussed orally 

## 2014-06-10 NOTE — Progress Notes (Signed)
   THERAPIST PROGRESS NOTE  Session Time: Thursday 06/09/2014 11:25 AM - 11:50 AM  Participation Level: Active  Behavioral Response: CasualAlertAnxious  Type of Therapy: Individual Therapy  Treatment Goals addressed:  Improve ability to manage stress and anxiety, improve assertiveness skills and ability to set/maintain boundaries   Interventions: CBT and Supportive  Summary: [name] [name] is a 53 y.o. female who presents with presents with a long-standing history of symptoms of anxiety and depression, a previous diagnosis of schizoaffective disorder, and reports a history of at least 3 psychiatric hospitalizations from 16 through 1990. Patient 's symptoms have included depressed mood, tearfulness, anxiety, insomnia, guilt, and excessive worrying.   Patient reports breaking two fingers on her left hand 2 weeks ago after falling when her ex-husband's dog jumped on her. She has been out of work since that time. She still does not want to keep the dog and has expressed this to ex-husband who threatened to end their relationship if she got rid of dog. Patient states being okay with this but deciding to keep dog as she knows how much dog means to him.  She has decided to try to focus on ways to keep self safe when around dog.  She expresses some anxiety regarding her aunt who became upset when patient decided to go back home to stay after staying with aunt for a few days after patient fell.   Suicidal/Homicidal: No  Therapist Response: Therapist works with patient to process feelings, identify ways to keep self safe and explore options, identify ways to improve self-care  Plan: Return again in 3 weeks.  Diagnosis: Axis I: Schizoaffective Disorder    Axis II: No diagnosis    [name] Fini, LCSW 06/10/2014

## 2014-06-17 ENCOUNTER — Ambulatory Visit (HOSPITAL_COMMUNITY): Payer: Self-pay | Admitting: Psychiatry

## 2014-07-07 ENCOUNTER — Ambulatory Visit (INDEPENDENT_AMBULATORY_CARE_PROVIDER_SITE_OTHER): Payer: 59 | Admitting: Psychiatry

## 2014-07-07 DIAGNOSIS — F251 Schizoaffective disorder, depressive type: Secondary | ICD-10-CM
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
## 2014-07-07 NOTE — Patient Instructions (Signed)
Discussed orally 

## 2014-08-11 ENCOUNTER — Ambulatory Visit (INDEPENDENT_AMBULATORY_CARE_PROVIDER_SITE_OTHER): Payer: 59 | Admitting: Psychiatry

## 2014-08-11 DIAGNOSIS — F251 Schizoaffective disorder, depressive type: Secondary | ICD-10-CM

## 2014-08-11 NOTE — Progress Notes (Signed)
      THERAPIST PROGRESS NOTE  Session Time: Thursday 08/11/2013 9:00 AM - 9:50 AM  Participation Level: Active  Behavioral Response: CasualAlert/less anxious  Type of Therapy: Individual Therapy  Treatment Goals addressed:  Improve ability to manage stress and anxiety, improve assertiveness skills and ability to set/maintain boundaries   Interventions: CBT and Supportive  Summary: [name] [name] is a 54 y.o. female who presents with presents with a long-standing history of symptoms of anxiety and depression, a previous diagnosis of schizoaffective disorder, and reports a history of at least 3 psychiatric hospitalizations from 13 through 1990. Patient 's symptoms have included depressed mood, tearfulness, anxiety, insomnia, guilt, and excessive worrying.   Patient reports improved mood since last session. She has set a goal of trying to attend church more regularly this year. She expresses frustration with her aunt who often calls patient when she is preparing to go to church. Patient reports often being distracted by aunt and not attending church. However, patient's aunt called last night but patient made the choice not to answer the phone. She reports talking to aunt after church. However, both she and her aunt became angry. Patient continues to express frustration regarding her husband and cousin requesting her to take them to purchase alcohol.   Suicidal/Homicidal: No  Therapist Response: Therapist works with patient to process feelings, distinguish being assertive versus being aggressive, identify triggers and signals of anger, identify coping and relaxation techniques to manage anger, do a role-play to practice effective assertive communication, and provide patient with handouts on "Learning How to Calm Down".  Plan: Return again in 3 - 4  weeks.  Diagnosis: Axis I: Schizoaffective Disorder    Axis II: No diagnosis    Randeep Biondolillo, LCSW 08/11/2014

## 2014-08-11 NOTE — Patient Instructions (Signed)
Discussed orally 

## 2014-09-01 ENCOUNTER — Ambulatory Visit (INDEPENDENT_AMBULATORY_CARE_PROVIDER_SITE_OTHER): Payer: 59 | Admitting: Psychiatry

## 2014-09-01 DIAGNOSIS — F251 Schizoaffective disorder, depressive type: Secondary | ICD-10-CM

## 2014-09-01 NOTE — Patient Instructions (Signed)
Discussed orally 
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
## 2014-09-05 ENCOUNTER — Telehealth (HOSPITAL_COMMUNITY): Payer: Self-pay | Admitting: *Deleted

## 2014-09-05 NOTE — Telephone Encounter (Signed)
Pt called stating she can not find the paper Peggy gived he on anger during her last visit. Pt would like to see if Vickii Chafe could print another copy for her. Pt number is [phone] pt work [phone] and will be leaving work at 2:00pm today.

## 2014-09-05 NOTE — Telephone Encounter (Signed)
Therapist returned patient's call and informed her therapist would leave the handouts for patient with secretary for patient to pick up.

## 2014-09-15 ENCOUNTER — Other Ambulatory Visit (HOSPITAL_COMMUNITY): Payer: Self-pay | Admitting: Family Medicine

## 2014-09-15 DIAGNOSIS — Z1231 Encounter for screening mammogram for malignant neoplasm of breast: Secondary | ICD-10-CM

## 2014-09-22 ENCOUNTER — Ambulatory Visit (INDEPENDENT_AMBULATORY_CARE_PROVIDER_SITE_OTHER): Payer: 59 | Admitting: Psychiatry

## 2014-09-22 DIAGNOSIS — F251 Schizoaffective disorder, depressive type: Secondary | ICD-10-CM

## 2014-09-22 NOTE — Patient Instructions (Signed)
Discussed orally 

## 2014-09-22 NOTE — Progress Notes (Signed)
       THERAPIST PROGRESS NOTE  Session Time: Thursday 09/22/2014 10:10 AM 10:55 AM  Participation Level: Active  Behavioral Response: CasualAlert/less anxious  Type of Therapy: Individual Therapy  Treatment Goals addressed:  Improve ability to manage stress and anxiety, improve assertiveness skills and ability to set/maintain boundaries   Interventions: CBT and Supportive  Summary: [name] [name] is a 54 y.o. female who presents with presents with a long-standing history of symptoms of anxiety and depression, a previous diagnosis of schizoaffective disorder, and reports a history of at least 3 psychiatric hospitalizations from 34 through 1990. Patient 's symptoms have included depressed mood, tearfulness, anxiety, insomnia, guilt, and excessive worrying.   Patient reports stress regarding interaction with a few of her relatives since last session. However, she denies any anger outbursts in any of the situations. She states she stood up for herself and says this made her feel good. She cites examples of this in her interaction with her cousin and her aunt. 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 She is pleased she is beginning to learn to set boundaries. Patient denies any depression since last session. She is less critical of self regarding attending church.   Suicidal/Homicidal: No  Therapist Response: Therapist works with patient to process feelings, praise use of assertiveness skills, discuss benefits and consequences of being asserive, review ways to manage anger   Plan: Return again in 3 - 4  weeks.  Diagnosis: Axis I: Schizoaffective Disorder    Axis II: No diagnosis     Mindee Robledo, LCSW 09/22/2014

## 2014-10-07 ENCOUNTER — Ambulatory Visit (HOSPITAL_COMMUNITY)
Admission: RE | Admit: 2014-10-07 | Discharge: 2014-10-07 | Disposition: A | Discharge status: Home / Self Care | Payer: 59 | Source: Ambulatory Visit | Attending: Family Medicine | Admitting: Family Medicine

## 2014-10-07 DIAGNOSIS — Z1231 Encounter for screening mammogram for malignant neoplasm of breast: Secondary | ICD-10-CM | POA: Diagnosis not present

## 2014-10-14 ENCOUNTER — Ambulatory Visit (INDEPENDENT_AMBULATORY_CARE_PROVIDER_SITE_OTHER): Payer: 59 | Admitting: Psychiatry

## 2014-10-14 DIAGNOSIS — F251 Schizoaffective disorder, depressive type: Secondary | ICD-10-CM

## 2014-10-14 NOTE — Patient Instructions (Signed)
Discussed orally 

## 2014-10-14 NOTE — Progress Notes (Signed)
       THERAPIST PROGRESS NOTE  Session Time: Friday 10/14/2014 10:15 AM -11:00 AM  Participation Level: Active  Behavioral Response: CasualAlert/less anxious  Type of Therapy: Individual Therapy  Treatment Goals addressed:  Improve ability to manage stress and anxiety, improve assertiveness skills and ability to set/maintain boundaries   Interventions: CBT and Supportive  Summary: [name] [name] is a 54 y.o. female who presents with presents with a long-standing history of symptoms of anxiety and depression, a previous diagnosis of schizoaffective disorder, and reports a history of at least 3 psychiatric hospitalizations from 40 through 1990. Patient 's symptoms have included depressed mood, tearfulness, anxiety, insomnia, guilt, and excessive worrying.   Patient reports improved mood and decreased anxiety since last session. She has continued to try to set and maintain boundaries with her relatives. She did have conflict with one of her aunts but reports they worked it out and recently went to dinner. She expresses frustration regarding all of her aunts' disapproval of her relationship with her ex-husband. Patient reports feeling sad depressed one day at work as she was feeling guilty about not going to the altar at church the previous Sunday. However, she was able to use activities and self-talk to feel cope. She is less critical of self regarding attending church.    Suicidal/Homicidal: No  Therapist Response: Therapist works with patient to process feelings, identify coping statements,   Plan: Return again in 3 - 4  weeks.  Diagnosis: Axis I: Schizoaffective Disorder    Axis II: No diagnosis     BYNUM,PEGGY, LCSW 10/14/2014

## 2014-11-08 ENCOUNTER — Ambulatory Visit (HOSPITAL_COMMUNITY): Payer: Self-pay | Admitting: Psychiatry

## 2014-11-24 ENCOUNTER — Telehealth (HOSPITAL_COMMUNITY): Payer: Self-pay | Admitting: *Deleted

## 2014-11-28 ENCOUNTER — Ambulatory Visit (HOSPITAL_COMMUNITY): Payer: Self-pay | Admitting: Psychiatry

## 2014-12-16 ENCOUNTER — Ambulatory Visit (INDEPENDENT_AMBULATORY_CARE_PROVIDER_SITE_OTHER): Payer: 59 | Admitting: Psychiatry

## 2014-12-16 DIAGNOSIS — F251 Schizoaffective disorder, depressive type: Secondary | ICD-10-CM | POA: Diagnosis not present

## 2014-12-16 NOTE — Patient Instructions (Signed)
Discussed orally 

## 2014-12-16 NOTE — Progress Notes (Signed)
        THERAPIST PROGRESS NOTE  Session Time: Friday 12/16/2014 10:00 AM - 11:05 AM  Participation Level: Active  Behavioral Response: CasualAlert/euthymic  Type of Therapy: Individual Therapy  Treatment Goals addressed:  Identify and replace thoughts and believes that supportive depression      Learn and implement behavioral strategies to overcome depression      Learn implement effective communication skills with family and significant others'   Interventions: CBT and Supportive  Summary: [name] [name] is a 54 y.o. female who presents with presents with a long-standing history of symptoms of anxiety and depression, a previous diagnosis of schizoaffective disorder, and reports a history of at least 3 psychiatric hospitalizations from 42 through 1990. Patient 's symptoms have included depressed mood, tearfulness, anxiety, insomnia, guilt, and excessive worrying.   Patient last was seen in March 2016. She  reports improved mood and decreased anxiety since last session. However, she reports increased frustration with family members and ex-husband. She has been staying with her aunt at night for the past two weeks as aunt fell. This has been stressful for patient as she prefers to be at home and this has disrupted her schedule. She also reports aunt has other relatives who could assist. She also reports resorting to previous pattens of having difficulty saying no to ex-husband and cousin when they want her to take them to do errands and she really doesn't have the time to do it. She also has not been able to go to church as frequently as she wants due to difficulty being assertive with her aunt per patient's report. She admits becoming angry and talking aggressively rather than assertively. She continues to have perceptual disturbances at times regarding her responsibility for events beyond her control. Patient reports maintaining appointments with psychiatrist Dr. Nila Nephew at [facility].   S Suicidal/Homicidal: No  Therapist Response: Therapist works with patient to process feelings, identify triggers of stress, identify thoughts and effects on mood/behavior, review and revise treatment plan.   Plan: Return again in 2-3  weeks.  Diagnosis: Axis I: Schizoaffective Disorder    Axis II: No diagnosis     BYNUM,PEGGY, LCSW 12/16/2014

## 2015-01-10 ENCOUNTER — Ambulatory Visit (INDEPENDENT_AMBULATORY_CARE_PROVIDER_SITE_OTHER): Payer: 59 | Admitting: Psychiatry

## 2015-01-10 DIAGNOSIS — F251 Schizoaffective disorder, depressive type: Secondary | ICD-10-CM

## 2015-01-10 NOTE — Progress Notes (Signed)
        THERAPIST PROGRESS NOTE  Session Time:  Tuesday 01/10/2015 10:00 AM - 10:53 AM  Participation Level: Active  Behavioral Response: CasualAlert/euthymic  Type of Therapy: Individual Therapy  Treatment Goals addressed:  Identify and replace thoughts and believes that supportive depression      Learn and implement behavioral strategies to overcome depression      Learn implement effective communication skills with family and significant others'   Interventions: CBT and Supportive  Summary: [name] [name] is a 54 y.o. female who presents with presents with a long-standing history of symptoms of anxiety and depression, a previous diagnosis of schizoaffective disorder, and reports a history of at least 3 psychiatric hospitalizations from 50 through 1990. Patient 's symptoms have included depressed mood, tearfulness, anxiety, insomnia, guilt, and excessive worrying.   Patient reports feeling better since last session. She reports being less depressed and feeling less guilty about not attending church as frequently as she thinks she should. She remains critical of self when she makes mistakes and reports sometimes having obsessive thoughts about losing her job. She reports recently talking to her boss who reassured her she is doing a good job. She also expresses fear of displeasing God and sometimes worries about negative comments a fellow church member made about patient's spirituality  several years ago per her report. She admits negative pattern of anticipating people are thinking negatively about her and reports a recent incident when she saw two of her fellow church members at a store.  last was seen in March 2016.  Patient reports maintaining appointments with psychiatrist Dr. Nila Nephew at [facility] and reports seeing her last week.   Suicidal/Homicidal: No  Therapist Response: Therapist works with patient to process feelings,review connection between thoughts, mood, and behavior,  identify and challenge cognitive distortions, discuss difference between desiring to excel/do best versus being perfect, identify effects of perfectionism  Plan: Return again in 2-3  weeks.  Diagnosis: Axis I: Schizoaffective Disorder    Axis II: No diagnosis     Kalicia Dufresne, LCSW 01/10/2015

## 2015-01-10 NOTE — Patient Instructions (Signed)
Discussed orally 

## 2015-01-24 ENCOUNTER — Ambulatory Visit (INDEPENDENT_AMBULATORY_CARE_PROVIDER_SITE_OTHER): Payer: 59 | Admitting: Psychiatry

## 2015-01-24 DIAGNOSIS — F251 Schizoaffective disorder, depressive type: Secondary | ICD-10-CM | POA: Diagnosis not present

## 2015-01-24 NOTE — Patient Instructions (Signed)
Discussed orally 

## 2015-01-24 NOTE — Progress Notes (Signed)
        THERAPIST PROGRESS NOTE  Session Time:  Tuesday 01/24/2015 10:00 AM - 10:45 AM  Participation Level: Active  Behavioral Response: CasualAlert/euthymic  Type of Therapy: Individual Therapy  Treatment Goals :    1. Identify and replace thoughts and believes that supportive depression      2. Learn and implement behavioral strategies to overcome depression      3. Learn implement effective communication skills with family and significant others'   Treatment Goals Addressed: 3   Interventions: CBT and Supportive  Summary: [name] [name] is a 54 y.o. female who presents with presents with a long-standing history of symptoms of anxiety and depression, a previous diagnosis of schizoaffective disorder, and reports a history of at least 3 psychiatric hospitalizations from 12 through 1990. Patient 's symptoms have included depressed mood, tearfulness, anxiety, insomnia, guilt, and excessive worrying.   Patient reports increased stress since last session. She expresses frustration as her aunt has expected patient to take her out to eat and do errands with little to no regard for patient's schedule. Patient has not done some of the activities she prefers like attending church and having dinner with her ex-husband as she has been doing things for her aunt. She also reports not having enough time for self and going to bed late due to helping aunt.  She reports increased eating and weight gain and states she has been too tired to go walking like she usually does. Patient reports difficulty saying no to aunt as she feels guilty. She also becoming resentful and sometimes using aggressive tone with aunt rather than being assertive. She reports aunt has other family members that could help including a son and two granddaughter but says they don't.  Patient reports she may not be able to continue therapy as her co-pay may be increasing. She plans to contact her insurance company today.    Suicidal/Homicidal: No  Therapist Response: Therapist works with patient to process feelings, identify ways aggressive tone perpetuates patient's feelings of guilt and difficulty being assertive with aunt, identify costs of not being assertive and the benefits of being assertive, do role play on how to be assertive in relationship with aunt.   Plan: Return again in 3-4 weeks.   Diagnosis: Axis I: Schizoaffective Disorder    Axis II: No diagnosis     Caysie Minnifield, LCSW 01/24/2015

## 2015-01-26 ENCOUNTER — Other Ambulatory Visit (HOSPITAL_COMMUNITY): Payer: Self-pay | Admitting: Family Medicine

## 2015-01-26 ENCOUNTER — Ambulatory Visit (HOSPITAL_COMMUNITY)
Admission: RE | Admit: 2015-01-26 | Discharge: 2015-01-26 | Disposition: A | Discharge status: Home / Self Care | Payer: 59 | Source: Ambulatory Visit | Attending: Family Medicine | Admitting: Family Medicine

## 2015-01-26 DIAGNOSIS — J209 Acute bronchitis, unspecified: Secondary | ICD-10-CM

## 2015-01-26 DIAGNOSIS — Z87891 Personal history of nicotine dependence: Secondary | ICD-10-CM | POA: Diagnosis not present

## 2015-02-17 ENCOUNTER — Ambulatory Visit (INDEPENDENT_AMBULATORY_CARE_PROVIDER_SITE_OTHER): Payer: 59 | Admitting: Psychiatry

## 2015-02-17 DIAGNOSIS — F251 Schizoaffective disorder, depressive type: Secondary | ICD-10-CM

## 2015-02-17 NOTE — Progress Notes (Signed)
         THERAPIST PROGRESS NOTE  Session Time:  Friday 02/17/2015 9:10 AM -9:55 AM  Participation Level: Active  Behavioral Response: CasualAlert/euthymic  Type of Therapy: Individual Therapy  Treatment Goals :    1. Identify and replace thoughts and believes that supportive depression      2. Learn and implement behavioral strategies to overcome depression      3. Learn implement effective communication skills with family and significant others'   Treatment Goals Addressed: 3   Interventions: CBT and Supportive  Summary: [name] [name] is a 54 y.o. female who presents with presents with a long-standing history of symptoms of anxiety and depression, a previous diagnosis of schizoaffective disorder, and reports a history of at least 3 psychiatric hospitalizations from 12 through 1990. Patient 's symptoms have included depressed mood, tearfulness, anxiety, insomnia, guilt, and excessive worrying.   Patient reports feeling better since last session regarding her response to her aunt's requests. She expresses frustration her aunt continues to make the request but is pleased she has been assertive with aunt in saying no for the past two requests. Patient states feeling good about standing up for self and helping self.  She expresses desire to be more assertive in other relationships especially the one with her ex-husband. She cites incident of ex-boyfriend always wanting her to take him to pay his bills which coincides with patient's family's monthly gathering which she wants to attend. She also wants to be assertive with a cousin who borrows money and does not repay her. He also frequently asks patient to take him places.   Suicidal/Homicidal: No  Therapist Response: Therapist works with patient to process feelings, praised patient's use of assertiveness skills in relationship with her aunt, identify the effects of being assertive in that relationship, do role play on how to be  assertive in the relationship with her boyfriend,    Plan: Return again in 3-4 weeks.   Diagnosis: Axis I: Schizoaffective Disorder    Axis II: No diagnosis     BYNUM,PEGGY, LCSW 02/17/2015

## 2015-02-17 NOTE — Patient Instructions (Signed)
Discussed orally 

## 2015-03-17 ENCOUNTER — Ambulatory Visit (INDEPENDENT_AMBULATORY_CARE_PROVIDER_SITE_OTHER): Payer: 59 | Admitting: Psychiatry

## 2015-03-17 DIAGNOSIS — F251 Schizoaffective disorder, depressive type: Secondary | ICD-10-CM | POA: Diagnosis not present

## 2015-03-17 NOTE — Patient Instructions (Signed)
Discussed orally 

## 2015-03-17 NOTE — Progress Notes (Signed)
          THERAPIST PROGRESS NOTE  Session Time:  Friday 02/17/2015 9:10 AM -9:55 AM  Participation Level: Active  Behavioral Response: CasualAlert/euthymic  Type of Therapy: Individual Therapy  Treatment Goals :    1. Identify and replace thoughts and believes that supportive depression      2. Learn and implement behavioral strategies to overcome depression      3. Learn implement effective communication skills with family and significant others'   Treatment Goals Addressed: 3   Interventions: CBT and Supportive  Summary: [name] [name] is a 54 y.o. female who presents with presents with a long-standing history of symptoms of anxiety and depression, a previous diagnosis of schizoaffective disorder, and reports a history of at least 3 psychiatric hospitalizations from 55 through 1990. Patient 's symptoms have included depressed mood, tearfulness, anxiety, insomnia, guilt, and excessive worrying.   Patient reports increased stress since last session. She says others including her aunt, cousin, and ex-husband don't seem to care about her feelings/plans and keep expecting her to do what they want her to do.  Patient expresses anger and frustration. She continues to struggle with assertiveness skills and has difficulty saying no.  She reports increased fatigue as she has been staying overnight with her aunt and taking others to various places. She reports having little to no time for self. She reports becoming so frustrated last weekend with her cousin and ex-husband that she had passive suicidal ideations with no plan and no intent. Patient states she wouldn't do anything due to religious beliefs. Patient is scheduled to see psychiatrist Dr. Nila Nephew on March 28, 2015.  Suicidal/Homicidal: Patient admits having passive suicidal ideations with no intent and no plan last Saturday. She states she wouldn't harm self due to religious beliefs. Patient agrees to call this practice, call 911,  or have someone take her to the ER should symptoms worsen.   Therapist Response: Therapist works with patient to process feelings, identify the effects of internalizing feelings and not setting boundaries, discuss being assertive versus being aggressive, do role play on how to be assertive in the relationships, discuss Basic Personal Rights handout,    Plan: Return again in 3-4 weeks. Patient agrees to read Basic Personal Rights daily  Diagnosis: Axis I: Schizoaffective Disorder    Axis II: No diagnosis     BYNUM,PEGGY, LCSW 03/17/2015

## 2015-04-05 ENCOUNTER — Encounter (HOSPITAL_COMMUNITY): Payer: Self-pay | Admitting: Psychiatry

## 2015-04-05 ENCOUNTER — Ambulatory Visit (INDEPENDENT_AMBULATORY_CARE_PROVIDER_SITE_OTHER): Payer: 59 | Admitting: Psychiatry

## 2015-04-05 DIAGNOSIS — F251 Schizoaffective disorder, depressive type: Secondary | ICD-10-CM

## 2015-04-05 NOTE — Progress Notes (Addendum)
          THERAPIST PROGRESS NOTE  Session Time:  Thursday 04/05/2015 9:06 AM - 10:00 AM  Participation Level: Active  Behavioral Response: CasualAlert/anxioua  Type of Therapy: Individual Therapy  Treatment Goals :    1. Identify and replace thoughts and believes that supportive depression      2. Learn and implement behavioral strategies to overcome depression      3. Learn implement effective communication skills with family and significant others'   Treatment Goals Addressed: 1, 3   Interventions: CBT and Supportive  Summary: MARILOUISE DENSMORE is a 54 y.o. female who presents with presents with a long-standing history of symptoms of anxiety and depression, a previous diagnosis of schizoaffective disorder, and reports a history of at least 3 psychiatric hospitalizations from 28 through 1990. Patient 's symptoms have included depressed mood, tearfulness, anxiety, insomnia, guilt, and excessive worrying.   Patient reports increased frustration since last session.  She continues to struggle with assertiveness skills and has difficulty saying no especially in the relationship with her aunt. She cites an incident that occurred last night in which she gave in to aunt. She has been able to be assertive in reducing staying overnight with her aunt. She expresses some frustration with self about recently attending church as she had negative thoughts about some of her fellow members based on the way they were looking at her. She could not identify any specific evidence to to support her thoughts. Patient reports she has not been reading Basic Personal Rights as she has lost form.  Patient did see Dr. Nila Nephew on March 28, 2015. She denies any suicidal ideations since last sesssion  Suicidal/Homicidal:  No  Therapist Response: Therapist works with patient to process feelings, identify blockers of assertion, identify ways to be  assertive in the relationships, identify assertive statements to  use in addressing issues in relationship with her aunt, provide patient with Basic Personal Rights handout, identify and challenge negative thinking patterns  Plan: Return again in 3-4 weeks. Patient agrees to read Basic Personal Rights daily  Diagnosis: Axis I: Schizoaffective Disorder    Axis II: No diagnosis     BYNUM,PEGGY, LCSW 04/05/2015

## 2015-04-05 NOTE — Patient Instructions (Signed)
Discussed orally 

## 2015-04-24 ENCOUNTER — Ambulatory Visit (INDEPENDENT_AMBULATORY_CARE_PROVIDER_SITE_OTHER): Payer: 59 | Admitting: Obstetrics & Gynecology

## 2015-04-24 ENCOUNTER — Encounter: Payer: Self-pay | Admitting: Obstetrics & Gynecology

## 2015-04-24 VITALS — BP 100/60 | HR 68 | Wt 187.0 lb

## 2015-04-24 DIAGNOSIS — N950 Postmenopausal bleeding: Secondary | ICD-10-CM

## 2015-04-24 NOTE — Progress Notes (Addendum)
Patient ID: [name] [name], female   DOB: 04-29-1961, 54 y.o.   MRN: [id number] Chief Complaint  Patient presents with  . Referral    bleeding/ not sure vaginal or rectum.    Blood pressure 100/60, pulse 68, weight 187 lb (84.823 kg), last menstrual period 03/18/2012.  54 y.o. G1P0010 Patient's last menstrual period was 03/18/2012. The current method of family planning is post menopausal status.  Subjective Pt had some bleeding with a BM, had some vaginal source bleeding 2 years ago and a work up was negative with a thin endometrial stripe Having some itching and burning in rectal area using preperation H  Objective Vulva:  normal appearing vulva with no masses, tenderness or lesions Vagina:  normal mucosa, no discharge Cervix:  no cervical motion tenderness and no lesions Uterus:  normal size, contour, position, consistency, mobility, non-tender Adnexa: ovaries:present,  normal adnexa in size, nontender and no masses    Pertinent ROS No burning with urination, frequency or urgency No nausea, vomiting or diarrhea Nor fever chills or other constitutional symptoms   Labs or studies     Impression Diagnoses this Encounter::   ICD-9-CM ICD-10-CM   1. PMB (postmenopausal bleeding) 627.1 N95.0 US Pelvis Complete     US Transvaginal Non-OB    Established relevant diagnosis(es):   Plan/Recommendations: No orders of the defined types were placed in this encounter.    Labs or Scans Ordered: Orders Placed This Encounter  Procedures  . US Pelvis Complete  . US Transvaginal Non-OB   Evaluate endometrium again   Follow up Return in about 1 week (around 05/01/2015) for GYN sono, Follow up, with Dr Elonda Husky.         All questions were answered.

## 2015-04-25 ENCOUNTER — Telehealth: Payer: Self-pay | Admitting: Obstetrics & Gynecology

## 2015-04-25 NOTE — Telephone Encounter (Signed)
Pt states has hemorrhoids and they have been itching and burning, saw Dr. Elonda Husky yesterday for vaginal bleeding. Pt states can she continue to use the Preparation H for the Hemorrhoids. Pt informed can continue to use the Preparation H for Hemorrhoids and keep her appt for 1 week for ultrasound. Pt verbalized understanding.

## 2015-04-27 ENCOUNTER — Ambulatory Visit: Payer: Self-pay | Admitting: Obstetrics & Gynecology

## 2015-04-28 ENCOUNTER — Encounter (HOSPITAL_COMMUNITY): Payer: Self-pay | Admitting: Psychiatry

## 2015-04-28 ENCOUNTER — Ambulatory Visit (INDEPENDENT_AMBULATORY_CARE_PROVIDER_SITE_OTHER): Payer: 59 | Admitting: Psychiatry

## 2015-04-28 DIAGNOSIS — F251 Schizoaffective disorder, depressive type: Secondary | ICD-10-CM | POA: Diagnosis not present

## 2015-04-28 NOTE — Progress Notes (Signed)
          THERAPIST PROGRESS NOTE   Session Time:  Friday 04/28/2015 9:04 AM - 10:01 AM  Participation Level: Active  Behavioral Response: CasualAlert/anxious  Type of Therapy: Individual Therapy  Treatment Goals :    1. Identify and replace thoughts and believes that supportive depression      2. Learn and implement behavioral strategies to overcome depression      3. Learn implement effective communication skills with family and significant others'   Treatment Goals Addressed: 1, 3   Interventions: CBT and Supportive  Summary: [name] [name] is a 53 y.o. female who presents with presents with a long-standing history of symptoms of anxiety and depression, a previous diagnosis of schizoaffective disorder, and reports a history of at least 3 psychiatric hospitalizations from 61 through 1990. Patient 's symptoms have included depressed mood, tearfulness, anxiety, insomnia, guilt, and excessive worrying.   Patient reports continued frustration since last session.  She continues to struggle with assertiveness skills and has difficulty saying no especially in her relationship with her aunt. She cites various incidents involving interaction with aunt and expresses anger aunt does not change. She also expresses frustration with cousin who continues to ask patient to do things that interfere with patient's plans. She expresses sadness about recent incident at church when she perceived she was being overlooked by members of the congregation. She also continues to have self-condemning thoughts when she doesn't attend church when she thinks she should.She could not identify any specific evidence to to support her thoughts. Patient reports she has not been reading Basic Personal Rights daily She denies any suicidal ideations since last sesssion  Suicidal/Homicidal:  No  Therapist Response: Therapist works with patient to process feelings, identify realistic expectations of relationship with  aunt, role play ways to be assertive in relationship with aunt including taking time to think about choices and using relaxation tecnniques before responding to aunt, and encourage patient to read with Basic Personal Rights handout, identify and challenge negative thinking patterns  Plan: Return again in 3-4 weeks. Patient agrees to read Basic Personal Rights daily  Diagnosis: Axis I: Schizoaffective Disorder    Axis II: No diagnosis     BYNUM,PEGGY, LCSW 04/28/2015

## 2015-04-28 NOTE — Patient Instructions (Signed)
Discussed orally 

## 2015-05-04 ENCOUNTER — Ambulatory Visit (INDEPENDENT_AMBULATORY_CARE_PROVIDER_SITE_OTHER): Payer: 59 | Admitting: Obstetrics & Gynecology

## 2015-05-04 ENCOUNTER — Ambulatory Visit (INDEPENDENT_AMBULATORY_CARE_PROVIDER_SITE_OTHER): Payer: 59

## 2015-05-04 ENCOUNTER — Encounter: Payer: Self-pay | Admitting: Obstetrics & Gynecology

## 2015-05-04 VITALS — BP 100/60 | HR 80 | Wt 187.4 lb

## 2015-05-04 DIAGNOSIS — N950 Postmenopausal bleeding: Secondary | ICD-10-CM | POA: Diagnosis not present

## 2015-05-04 NOTE — Progress Notes (Signed)
PELVIC US TA/TV: heterogenous retroverted uterus w/mult fibroids,(#1) fundal intramural fibroid 1.5 x 2.1 x 1cm, (#2) .4 x .2 x .3 cm uterine calcification post left, (#3) post subserosal fibroid .8 x 1 x .5cm,normal ov's bilat, EEC 3.55mm

## 2015-05-04 NOTE — Progress Notes (Signed)
Patient ID: [name] [name], female   DOB: September 22, 1960, 54 y.o.   MRN: [id number] Chief Complaint  Patient presents with  . Follow-up    ultrasound today.    Blood pressure 100/60, pulse 80, weight 187 lb 6.4 oz (85.004 kg), last menstrual period 03/18/2012.  54 y.o. G1P0010 Patient's last menstrual period was 03/18/2012. The current method of family planning is none.  Subjective See previous note, pt came in with ?vaginal vs possible hemorrhoidal bleeding Exam in office earlier this month was negative Sonogram ordered today, see report below and shows no pathology or endometrial pathology as etiology of bleeding  Objective   Pertinent ROS   Labs or studies US Transvaginal Non-ob  05/04/2015   GYNECOLOGIC SONOGRAM   [name] [name] is a 54 y.o. G1P0010 patient is here  for a pelvic sonogram  for postmenopausal bleeding.  Uterus                      4.9 x 2.7 x 3.4 cm,  heterogenous retroverted  uterus w/mult fibroids,(#1) fundal intramural fibroid 1.5 x 2.1 x 1cm,  (#2) .4 x .2 x .3 cm uterine calcification post left, (#3) post                                   subserosal fibroid .8 x 1 x .5cm  Endometrium          3.2 mm, symmetrical, wnl  Right ovary             1.3 x 2.3 x 1.1 cm, wnl  Left ovary                .7 x .9 x 2.5 cm, wnl    Technician Comments:  PELVIC US TA/TV: heterogenous retroverted uterus w/mult fibroids,(#1)  fundal intramural fibroid 1.5 x 2.1 x 1cm, (#2) .4 x .2 x .3 cm uterine  calcification post left, (#3) post subserosal fibroid .8 x 1 x .5cm,normal  ov's bilat, EEC 3.18mm     Silver Huguenin 05/04/2015 9:48 AM  Clinical Impression and recommendations:  I have reviewed the sonogram results above, combined with the patient's  current clinical course, below are my impressions and any appropriate  recommendations for management based on the sonographic findings.  Thin endometrium, homogenous, making an endometrial biopsy evaluation  unnecessary Post menopausal ovaries Small  uterus with small fibroids, clinically insignificant   EURE,LUTHER H 05/04/2015 9:58 AM    US Pelvis Complete  05/04/2015   GYNECOLOGIC SONOGRAM   [name] [name] is a 54 y.o. G1P0010 patient is here  for a pelvic sonogram  for postmenopausal bleeding.  Uterus                      4.9 x 2.7 x 3.4 cm,  heterogenous retroverted  uterus w/mult fibroids,(#1) fundal intramural fibroid 1.5 x 2.1 x 1cm,  (#2) .4 x .2 x .3 cm uterine calcification post left, (#3) post                                   subserosal fibroid .8 x 1 x .5cm  Endometrium          3.2 mm, symmetrical, wnl  Right ovary             1.3 x 2.3  x 1.1 cm, wnl  Left ovary                .7 x .9 x 2.5 cm, wnl    Technician Comments:  PELVIC US TA/TV: heterogenous retroverted uterus w/mult fibroids,(#1)  fundal intramural fibroid 1.5 x 2.1 x 1cm, (#2) .4 x .2 x .3 cm uterine  calcification post left, (#3) post subserosal fibroid .8 x 1 x .5cm,normal  ov's bilat, EEC 3.40mm     Silver Huguenin 05/04/2015 9:48 AM  Clinical Impression and recommendations:  I have reviewed the sonogram results above, combined with the patient's  current clinical course, below are my impressions and any appropriate  recommendations for management based on the sonographic findings.  Thin endometrium, homogenous, making an endometrial biopsy evaluation  unnecessary Post menopausal ovaries Small uterus with small fibroids, clinically insignificant   EURE,LUTHER H 05/04/2015 9:58 AM       Impression Diagnoses this Encounter::   ICD-9-CM ICD-10-CM   1. PMB (postmenopausal bleeding) 627.1 N95.0   with thin endometrium, not requiring an endometrial biopsy due to <5 mm and homogenous  Established relevant diagnosis(es): Vs hemorrhoids, favor hemorrhoids at this point with negative sonogram findings  Plan/Recommendations: No orders of the defined types were placed in this encounter.    Labs or Scans Ordered: No orders of the defined types were placed in this encounter.       Follow up Return if symptoms worsen or fail to improve.      Face to face time:  10 minutes  Greater than 50% of the visit time was spent in counseling and coordination of care with the patient.  The summary and outline of the counseling and care coordination is summarized in the note above.   All questions were answered.

## 2015-05-12 ENCOUNTER — Encounter: Payer: Self-pay | Admitting: Internal Medicine

## 2015-05-18 ENCOUNTER — Encounter (HOSPITAL_COMMUNITY): Payer: Self-pay | Admitting: Psychiatry

## 2015-05-18 ENCOUNTER — Ambulatory Visit (INDEPENDENT_AMBULATORY_CARE_PROVIDER_SITE_OTHER): Payer: 59 | Admitting: Psychiatry

## 2015-05-18 DIAGNOSIS — F251 Schizoaffective disorder, depressive type: Secondary | ICD-10-CM

## 2015-05-18 NOTE — Progress Notes (Addendum)
          THERAPIST PROGRESS NOTE   Session Time:   Thursday 05/18/2015  9:10 AM -  10: 10 AM  Participation Level: Active  Behavioral Response: CasualAlert/euthymic  Type of Therapy: Individual Therapy  Treatment Goals :    1.Learn implement effective communication skills with family and significant others   Treatment Goals Addressed: 1.   Interventions: CBT and Supportive  Summary: [name] [name] is a 54 y.o. female who presents with presents with a long-standing history of symptoms of anxiety and depression, a previous diagnosis of schizoaffective disorder, and reports a history of at least 3 psychiatric hospitalizations from 30 through 1990. Patient 's symptoms have included depressed mood, tearfulness, anxiety, insomnia, guilt, and excessive worrying.   Patient reports continuing to do well regarding coping with any feelings of depression since last session. She admits sometimes feeling down at work but uses distracting activities and positive self-talk to cope. She continues to have excessive guilt about not attending church as much as she thinks she should. She is pleased she has been more assertive and set boundaries in the relationship with one of her aunts. She has been pausing before giving aunt an answer to her requests. She also has been reading Basic Personal Rights handout. She states now having more time for self and being less tired. She continues to struggle with assertiveness skills in relationships with other family members and her renter. Patient also reports recent thoughts about possibly remarrying her ex-husband as they are getting along better and she states being tired of living alone. She denies any suicidal ideations since last sesssion.  Suicidal/Homicidal:  No  Therapist Response: Therapist works with patient to review symptoms, process feelings, praise use of assertiveness skills, review treatment plan, role play ways to assertive in a relationship  with another aunt, explore options regarding being assertive in relationship with renter, develop list of pros and cons about possibility of remarrying ex-husband, identify and challenge negative thinking patterns  Plan: Return again in 3-4 weeks. Patient agrees to read Basic Personal Rights daily  Diagnosis: Axis I: Schizoaffective Disorder    Axis II: No diagnosis     [name], LCSW 05/18/2015

## 2015-05-18 NOTE — Patient Instructions (Signed)
Discussed orally 

## 2015-06-05 ENCOUNTER — Telehealth (HOSPITAL_COMMUNITY): Payer: Self-pay | Admitting: *Deleted

## 2015-06-05 ENCOUNTER — Telehealth (HOSPITAL_COMMUNITY): Payer: Self-pay | Admitting: Psychiatry

## 2015-06-05 ENCOUNTER — Ambulatory Visit: Payer: Self-pay | Admitting: Nurse Practitioner

## 2015-06-05 NOTE — Telephone Encounter (Signed)
Therapist returned patient's call. Patient reports being very depressed and having fleeting suicidal ideations yesterday. Per her report, this was triggered by guilt related to not attending church because she was doing errands for her aunt. She also reports feeling bad because aunt accused her of not caring about her. Patient denies current suicidal ideations and denies any plan or intent to harm self. However, she reports feeling very down this morning with passive thoughts but says she is feeling a little better now. She reports good support from her boss. She agrees to call 911 or have someone take her to the ER should symptoms worsen. She also agrees to come in for an earlier appointment tomorrow 06/06/2015 at 4:00 PM. Therapist works with patient to identify coping statements and distracting activities.

## 2015-06-05 NOTE — Telephone Encounter (Signed)
Pt called stating she would like to see Peggy sooner then her sch appt. Per pt, if not then she can just have Peggy call her and then she could talk to her then. Per pt, she would like for Peggy to call her today. Per pt, she feels down and depressed. Per pt, she was ok and something's happened since she last saw Peggy and would like to talk to her. Pt work number is [phone]. Per pt, she won't be home until 2:30-3:00pm but can call her on her cell [phone] after then.

## 2015-06-06 ENCOUNTER — Encounter (HOSPITAL_COMMUNITY): Payer: Self-pay | Admitting: Psychiatry

## 2015-06-06 ENCOUNTER — Ambulatory Visit (INDEPENDENT_AMBULATORY_CARE_PROVIDER_SITE_OTHER): Payer: 59 | Admitting: Psychiatry

## 2015-06-06 DIAGNOSIS — F251 Schizoaffective disorder, depressive type: Secondary | ICD-10-CM | POA: Diagnosis not present

## 2015-06-06 NOTE — Progress Notes (Signed)
           THERAPIST PROGRESS NOTE   Session Time:     Tuesday 06/06/2015 3:55 PM  - 4:40 PM  Participation Level: Active  Behavioral Response: CasualAlert/depressed  Type of Therapy: Individual Therapy  Treatment Goals :    1.Learn implement effective communication skills with family and significant others   Treatment Goals Addressed: 1.   Interventions: CBT and Supportive  Summary: [name] [name] is a 54 y.o. female who presents with presents with a long-standing history of symptoms of anxiety and depression, a previous diagnosis of schizoaffective disorder, and reports a history of at least 3 psychiatric hospitalizations from 66 through 1990. Patient 's symptoms have included depressed mood, tearfulness, anxiety, insomnia, guilt, and excessive worrying.   Patient is seen earlier than her scheduled appointment today as she experienced increased depression during the weekend. She reports becoming upset with ex-husband, cousin, and aunt all asking her to do things for them and having little to no time for self. She also expresses guilt and frustration with self as she did not fast or go to church like she thought she should have. She reports having fleeting suicidal ideations on Sunday. She also reports still being upset when she went to work yesterday morning and becoming more upset when one of the employees at the job did not help patient with a matter in the manner patient wanted. She states she became mad and made comment she wanted to kill self. She denies any suicidal ideations since then and denies current suicidal ideations. She continues to express frustration with family and struggles with assertiveness skills.   Suicidal/Homicidal:  No. Patient agrees to call this practice, call 911, or have someone take her to the ER should symptoms worsen.  Therapist Response: Therapist works with patient to review symptoms, process feelings, identify ways to improve assertiveness  skills, discuss ways to pause when it is difficult to say no, review ways to use Basic Personal Rights, identify coping strategies and activities to use when feeling down, identify coping statements  Plan: Return again in 2 weeks. Patient agrees to read Basic Personal Rights daily  Diagnosis: Axis I: Schizoaffective Disorder    Axis II: No diagnosis     Dvid Pendry, LCSW 06/06/2015

## 2015-06-06 NOTE — Patient Instructions (Signed)
Discussed orally 

## 2015-06-15 ENCOUNTER — Ambulatory Visit (INDEPENDENT_AMBULATORY_CARE_PROVIDER_SITE_OTHER): Payer: 59 | Admitting: Psychiatry

## 2015-06-15 ENCOUNTER — Encounter (HOSPITAL_COMMUNITY): Payer: Self-pay | Admitting: Psychiatry

## 2015-06-15 DIAGNOSIS — F251 Schizoaffective disorder, depressive type: Secondary | ICD-10-CM | POA: Diagnosis not present

## 2015-06-15 NOTE — Progress Notes (Signed)
           THERAPIST PROGRESS NOTE   Session Time:    Thursday 06/15/2015  9:05 AM -   9:45 AM  Participation Level: Active  Behavioral Response: CasualAlert/depressed  Type of Therapy: Individual Therapy  Treatment Goals :    1.Learn implement effective communication skills with family and significant others   Treatment Goals Addressed: 1.   Interventions: CBT and Supportive  Summary: [name] [name] is a 54 y.o. female who presents with presents with a long-standing history of symptoms of anxiety and depression, a previous diagnosis of schizoaffective disorder, and reports a history of at least 3 psychiatric hospitalizations from 31 through 1990. Patient 's symptoms have included depressed mood, tearfulness, anxiety, insomnia, guilt, and excessive worrying.   Patient reports continued frustration with family members asking her to take her places as this interferes with her plans. She also expresses frustration with self for allowing this to happen. She denies any suicidal ideations but admits putting self down and thinking negatively about self. Patient continues to work and is looking forward to celebrating Thanksgiving with her aunt.  Suicidal/Homicidal:  No. Patient agrees to call this practice, call 911, or have someone take her to the ER should symptoms worsen.  Therapist Response: Therapist works with patient to review symptoms, process feelings, identify thoughts that inhibit effective assertion and counterstatements, review ways to pause when it is difficult to say no,  identify coping statements to use when feeling down,    Plan: Return again in 3-4 weeks. Patient is scheduled to see psychiatrist Dr. Nila Nephew on 06/20/2015.   Diagnosis: Axis I: Schizoaffective Disorder    Axis II: No diagnosis     BYNUM,PEGGY, LCSW 06/15/2015

## 2015-06-15 NOTE — Patient Instructions (Signed)
Discussed orally 

## 2015-06-21 ENCOUNTER — Encounter: Payer: Self-pay | Admitting: Nurse Practitioner

## 2015-06-21 ENCOUNTER — Ambulatory Visit (INDEPENDENT_AMBULATORY_CARE_PROVIDER_SITE_OTHER): Payer: 59 | Admitting: Nurse Practitioner

## 2015-06-21 ENCOUNTER — Other Ambulatory Visit: Payer: Self-pay

## 2015-06-21 VITALS — BP 136/75 | HR 87 | Temp 97.6°F | Ht 64.0 in | Wt 188.6 lb

## 2015-06-21 DIAGNOSIS — K625 Hemorrhage of anus and rectum: Secondary | ICD-10-CM

## 2015-06-21 DIAGNOSIS — Z800 Family history of malignant neoplasm of digestive organs: Secondary | ICD-10-CM | POA: Diagnosis not present

## 2015-06-21 HISTORY — DX: Hemorrhage of anus and rectum: K62.5

## 2015-06-21 MED ORDER — PEG 3350-KCL-NA BICARB-NACL 420 G PO SOLR
4000.0000 mL | Freq: Once | ORAL | Status: DC
Start: 2015-06-21 — End: 2016-01-19

## 2015-06-21 MED ORDER — HYDROCORTISONE 2.5 % RE CREA: 1.0000 "application " | TOPICAL_CREAM | Freq: Two times a day (BID) | RECTAL | Status: DC

## 2015-06-21 NOTE — Patient Instructions (Signed)
1. We will schedule your procedure for you. 2. Further recommendations to be based on results of your procedure. 3. Return for follow-up in 3 months.

## 2015-06-21 NOTE — Progress Notes (Signed)
Primary Care Physician:  Robert Bellow, MD Primary Gastroenterologist:  [name]  Chief Complaint  Patient presents with  . Rectal Bleeding    HPI:   54 year old female referred by primary care for rectal bleeding. PCP notes reviewed. Patient last visit with her PCP was on 05/10/2015 for continued rectal bleeding. She had been given Anusol suppositories which per the PCP note the patient states was helping however she bleeds if she does not use them. Previously she was having about a teaspoon of bright red blood in a time. Rectal exam completed which "found nothing" and the patient was subsequently referred for further evaluation.  Today she states the suppositories are helping but she continues to bleed despite the suppositories. Has never had a colonoscopy. Admits some rectal burning and itching but it's mild. Last episode of hematochezia was yesterday and was minimal. Day before yesterday was "worse." Is also having some diarrhea and rectal itching. Denies abdominal pain, vomiting. Admits some nausea. Denies fever, chills, unintentional weight loss. Has had a change in her bowel habits to frequent stools, looser stools which started about the time her rectal bleeding started. Denies chest pain, dyspnea, dizziness, lightheadedness, syncope, near syncope. Denies any other upper or lower GI symptoms.  Past Medical History  Diagnosis Date  . Obsessive-compulsive disorders   . Postmenopausal bleeding   . Essential hypertension, benign   . Schizoaffective disorder ([place])   . BV (bacterial vaginosis) 01/28/2013    Past Surgical History  Procedure Laterality Date  . Bunionectomy      Current Outpatient Prescriptions  Medication Sig Dispense Refill  . calcium citrate-vitamin D (CITRACAL+D) 315-200 MG-UNIT per tablet Take 2 tablets by mouth daily.     . divalproex (DEPAKOTE) 500 MG DR tablet Take 500 mg by mouth 2 (two) times daily.     [name] FLUoxetine (PROZAC) 40 MG capsule Take 40 mg  by mouth daily.     . hydrochlorothiazide (HYDRODIURIL) 25 MG tablet Take 25 mg by mouth daily.    [name] ibuprofen (ADVIL,MOTRIN) 200 MG tablet Take 400 mg by mouth 2 (two) times daily as needed for headache.    . imipramine (TOFRANIL) 50 MG tablet Take 50 mg by mouth at bedtime.     [name] lisinopril (PRINIVIL,ZESTRIL) 5 MG tablet Take 5 mg by mouth daily.    . Multiple Vitamin (MULTIVITAMIN) tablet Take 1 tablet by mouth daily.    . ziprasidone (GEODON) 80 MG capsule Take 80 mg by mouth 2 (two) times daily with a meal.     . HYDROcodone-acetaminophen (NORCO) 5-325 MG per tablet Take 2 tablets by mouth every 6 (six) hours as needed for moderate pain. (Patient not taking: Reported on 04/24/2015) 45 tablet 0  . sulfamethoxazole-trimethoprim (BACTRIM DS) 800-160 MG per tablet Take 1 tablet by mouth 2 (two) times daily. (Patient not taking: Reported on 04/24/2015) 20 tablet 0   No current facility-administered medications for this visit.    Allergies as of 06/21/2015 - Review Complete 06/21/2015  Allergen Reaction Noted  . Penicillins Rash 04/18/2012    Family History  Problem Relation Age of Onset  . Diabetes type II Maternal Grandmother   . Diabetes Maternal Grandmother   . Hypertension Maternal Aunt     Social History   Social History  . Marital Status: Divorced    Spouse Name: N/A  . Number of Children: N/A  . Years of Education: N/A   Occupational History  . Not on file.   Social History Main  Topics  . Smoking status: Never Smoker   . Smokeless tobacco: Never Used  . Alcohol Use: No  . Drug Use: No  . Sexual Activity: Not Currently    Birth Control/ Protection: Post-menopausal   Other Topics Concern  . Not on file   Social History Narrative    Review of Systems: General: Negative for anorexia, weight loss, fever, chills, fatigue, weakness. Eyes: Negative for vision changes.  ENT: Negative for hoarseness, difficulty swallowing. CV: Negative for chest pain, angina,  palpitations, peripheral edema.  Respiratory: Negative for dyspnea at rest, cough, sputum, wheezing.  GI: See history of present illness. Derm: Negative for rash or itching.  Endo: Negative for unusual weight change.  Heme: Negative for bruising or bleeding. Allergy: Negative for rash or hives.    Physical Exam: BP 136/75 mmHg  Pulse 87  Temp(Src) 97.6 F (36.4 C) (Oral)  Ht 5\' 4"  (1.626 m)  Wt 188 lb 9.6 oz (85.548 kg)  BMI 32.36 kg/m2  LMP 03/18/2012 General:   Alert and oriented. Pleasant and cooperative. Well-nourished and well-developed.  Head:  Normocephalic and atraumatic. Eyes:  Without icterus, sclera clear and conjunctiva pink.  Ears:  Normal auditory acuity. Mouth:  No deformity or lesions, oral mucosa pink.  Throat/Neck:  Supple, without mass or thyromegaly. Cardiovascular:  S1, S2 present without murmurs appreciated. Extremities without clubbing or edema. Respiratory:  Clear to auscultation bilaterally. No wheezes, rales, or rhonchi. No distress.  Gastrointestinal:  +BS, rounded but soft, non-tender and non-distended. No HSM noted. No guarding or rebound. No masses appreciated.  Rectal:  Deferred  Musculoskalatal:  Symmetrical without gross deformities. Normal posture. Skin:  Intact without significant lesions or rashes. Neurologic:  Alert and oriented x4;  grossly normal neurologically. Psych:  Alert and cooperative. Normal mood and affect.    06/21/2015 8:22 AM

## 2015-06-22 NOTE — Assessment & Plan Note (Signed)
Patient with continued rectal bleeding despite hemorrhoid suppositories which do seem to help somewhat. Last episode of bleeding yesterday which is minimal in the day before which was significantly worse. Also with bowel habit changes toward frequent loose stools which started about the time her rectal bleeding noted. At this point we'll proceed with a colonoscopy to further evaluate her symptoms. Return in 3 months for follow-up. She is not a colonoscopy before and does have a family history of colon cancer as her mother was diagnosed in her late 29s.  Proceed with TCS with 25 mg Phenergan pre-procedure with [name] in near future: the risks, benefits, and alternatives have been discussed with the patient in detail. The patient states understanding and desires to proceed.  Patient is not on any anticoagulants. She is on Prozac and Geodon. No chronic pain medications or anxiolytics. We'll provide for 25 mg IV Phenergan preprocedure to promote adequate sedation.

## 2015-06-22 NOTE — Assessment & Plan Note (Signed)
Family history of colon cancer in her mother was diagnosed at age 54 and passed away 2 years later. She is never had a colonoscopy before. Given her symptoms and presentation as noted above we will proceed with a colonoscopy. Return for follow-up in 3 months.

## 2015-06-26 NOTE — Progress Notes (Signed)
CC'D TO PCP °

## 2015-07-05 ENCOUNTER — Encounter (HOSPITAL_COMMUNITY): Payer: Self-pay | Admitting: *Deleted

## 2015-07-05 ENCOUNTER — Encounter (HOSPITAL_COMMUNITY)
Admission: RE | Disposition: A | Discharge status: Home / Self Care | Payer: Self-pay | Source: Ambulatory Visit | Attending: Internal Medicine

## 2015-07-05 ENCOUNTER — Ambulatory Visit (HOSPITAL_COMMUNITY)
Admission: RE | Admit: 2015-07-05 | Discharge: 2015-07-05 | Disposition: A | Discharge status: Home / Self Care | Payer: 59 | Source: Ambulatory Visit | Attending: Internal Medicine | Admitting: Internal Medicine

## 2015-07-05 DIAGNOSIS — Z800 Family history of malignant neoplasm of digestive organs: Secondary | ICD-10-CM | POA: Diagnosis not present

## 2015-07-05 DIAGNOSIS — K625 Hemorrhage of anus and rectum: Secondary | ICD-10-CM

## 2015-07-05 DIAGNOSIS — Z86010 Personal history of colonic polyps: Secondary | ICD-10-CM | POA: Insufficient documentation

## 2015-07-05 DIAGNOSIS — K5730 Diverticulosis of large intestine without perforation or abscess without bleeding: Secondary | ICD-10-CM | POA: Diagnosis not present

## 2015-07-05 DIAGNOSIS — K649 Unspecified hemorrhoids: Secondary | ICD-10-CM | POA: Diagnosis not present

## 2015-07-05 DIAGNOSIS — F259 Schizoaffective disorder, unspecified: Secondary | ICD-10-CM | POA: Insufficient documentation

## 2015-07-05 DIAGNOSIS — K921 Melena: Secondary | ICD-10-CM | POA: Diagnosis not present

## 2015-07-05 DIAGNOSIS — Z79899 Other long term (current) drug therapy: Secondary | ICD-10-CM | POA: Insufficient documentation

## 2015-07-05 DIAGNOSIS — Q438 Other specified congenital malformations of intestine: Secondary | ICD-10-CM | POA: Diagnosis not present

## 2015-07-05 DIAGNOSIS — D120 Benign neoplasm of cecum: Secondary | ICD-10-CM | POA: Insufficient documentation

## 2015-07-05 DIAGNOSIS — F429 Obsessive-compulsive disorder, unspecified: Secondary | ICD-10-CM | POA: Insufficient documentation

## 2015-07-05 DIAGNOSIS — I10 Essential (primary) hypertension: Secondary | ICD-10-CM | POA: Insufficient documentation

## 2015-07-05 HISTORY — PX: COLONOSCOPY: SHX5424

## 2015-07-05 SURGERY — COLONOSCOPY
Anesthesia: Moderate Sedation

## 2015-07-05 MED ORDER — ONDANSETRON HCL 4 MG/2ML IJ SOLN
INTRAMUSCULAR | Status: AC
  Filled 2015-07-05: qty 2

## 2015-07-05 MED ORDER — MIDAZOLAM HCL 5 MG/5ML IJ SOLN
INTRAMUSCULAR | Status: DC | PRN
  Administered 2015-07-05: 2 mg via INTRAVENOUS
  Administered 2015-07-05 (×2): 1 mg via INTRAVENOUS

## 2015-07-05 MED ORDER — PROMETHAZINE HCL 25 MG/ML IJ SOLN
INTRAMUSCULAR | Status: AC
  Filled 2015-07-05: qty 1

## 2015-07-05 MED ORDER — ONDANSETRON HCL 4 MG/2ML IJ SOLN
INTRAMUSCULAR | Status: DC | PRN
  Administered 2015-07-05: 4 mg via INTRAVENOUS

## 2015-07-05 MED ORDER — MIDAZOLAM HCL 5 MG/5ML IJ SOLN
INTRAMUSCULAR | Status: AC
  Filled 2015-07-05: qty 10

## 2015-07-05 MED ORDER — SODIUM CHLORIDE 0.9 % IJ SOLN
INTRAMUSCULAR | Status: AC
  Filled 2015-07-05: qty 3

## 2015-07-05 MED ORDER — MEPERIDINE HCL 100 MG/ML IJ SOLN
INTRAMUSCULAR | Status: AC
  Filled 2015-07-05: qty 2

## 2015-07-05 MED ORDER — STERILE WATER FOR IRRIGATION IR SOLN
Status: DC | PRN
  Administered 2015-07-05: 12:00:00

## 2015-07-05 MED ORDER — SODIUM CHLORIDE 0.9 % IV SOLN
INTRAVENOUS | Status: DC
  Administered 2015-07-05: 12:00:00 via INTRAVENOUS

## 2015-07-05 MED ORDER — MEPERIDINE HCL 100 MG/ML IJ SOLN
INTRAMUSCULAR | Status: DC | PRN
  Administered 2015-07-05: 25 mg via INTRAVENOUS

## 2015-07-05 MED ORDER — PROMETHAZINE HCL 25 MG/ML IJ SOLN
25.0000 mg | Freq: Once | INTRAMUSCULAR | Status: AC
  Administered 2015-07-05: 25 mg via INTRAVENOUS

## 2015-07-05 NOTE — Op Note (Signed)
[facility] [address] [place], [address]   COLONOSCOPY PROCEDURE REPORT  PATIENT: [name] [name], [name] [name]  MR#: [id number] BIRTHDATE: 1961-07-03 , 50  yrs. old GENDER: female ENDOSCOPIST: R.  Garfield Cornea, MD FACP [facility] REFERRED [id number] [name], M.D. PROCEDURE DATE:  07/17/2015 PROCEDURE:   Colonoscopy with snare polypectomy INDICATIONS:Hematochezia; positive family history of colon cancer.  MEDICATIONS: Versed 4 mg IV and Demerol 25 mg IV in divided doses. Phenergan 25 mg IV.  Zofran 4 mg IV. ASA CLASS:       Class III  CONSENT: The risks, benefits, alternatives and imponderables including but not limited to bleeding, perforation as well as the possibility of a missed lesion have been reviewed.  The potential for biopsy, lesion removal, etc. have also been discussed. Questions have been answered.  All parties agreeable.  Please see the history and physical in the medical record for more information.  DESCRIPTION OF PROCEDURE:   After the risks benefits and alternatives of the procedure were thoroughly explained, informed consent was obtained.  The digital rectal exam revealed no abnormalities of the rectum.   The [id number]  endoscope was introduced through the anus and advanced to the cecum, which was identified by both the appendix and ileocecal valve. No adverse events experienced.   The quality of the prep was adequate  The instrument was then slowly withdrawn as the colon was fully examined. Estimated blood loss is zero unless otherwise noted in this procedure report.      COLON FINDINGS: Internal hemorrhoids; otherwise normal appearing rectal mucosa.  Redundant colon.  Scattered left-sided diverticula; (1) 32mm polyp in the base of the cecum; otherwise, the remainder of the colonic mucosa appeared normal.  Patient's colon was redundant, requiring external abdominal pressure and changing of the patient's position to reach the cecum.  The  above-mentioned polyp was cold snare removed.  Retroflexion was performed. .  Withdrawal time=8 minutes 0 seconds.  The scope was withdrawn and the procedure completed. COMPLICATIONS: There were no immediate complications. EBL 2 mL ENDOSCOPIC IMPRESSION: Internal hemorrhoids?"likely source of hematochezia. Redundant colon. Colonic diverticulosis. Colonic polyp?"removed as described above.  RECOMMENDATIONS: Begin Benefiber 1 tablespoon twice daily. Patient has failed conservative management for hemorrhoids. We'll schedule her follow-up appointment with [name] in 4-6 weeks for hemorrhoid banding in the office.  Follow up on pathology.  eSigned:  R. Garfield Cornea, MD Rosalita Chessman [facility] July 17, 2015 1:07 PM   cc:  CPT CODES: ICD CODES:  The ICD and CPT codes recommended by this software are interpretations from the data that the clinical staff has captured with the software.  The verification of the translation of this report to the ICD and CPT codes and modifiers is the sole responsibility of the health care institution and practicing physician where this report was generated.  [place]. will not be held responsible for the validity of the ICD and CPT codes included on this report.  AMA assumes no liability for data contained or not contained herein. CPT is a Designer, television/film set of the [employer].  PATIENT NAME:  [name] [name], [name] [name] MR#: [id number]

## 2015-07-05 NOTE — Discharge Instructions (Signed)
Colonoscopy Discharge Instructions  Read the instructions outlined below and refer to this sheet in the next few weeks. These discharge instructions provide you with general information on caring for yourself after you leave the hospital. Your doctor may also give you specific instructions. While your treatment has been planned according to the most current medical practices available, unavoidable complications occasionally occur. If you have any problems or questions after discharge, call [name] at [phone]. ACTIVITY  You may resume your regular activity, but move at a slower pace for the next 24 hours.   Take frequent rest periods for the next 24 hours.   Walking will help get rid of the air and reduce the bloated feeling in your belly (abdomen).   No driving for 24 hours (because of the medicine (anesthesia) used during the test).    Do not sign any important legal documents or operate any machinery for 24 hours (because of the anesthesia used during the test).  NUTRITION  Drink plenty of fluids.   You may resume your normal diet as instructed by your doctor.   Begin with a light meal and progress to your normal diet. Heavy or fried foods are harder to digest and may make you feel sick to your stomach (nauseated).   Avoid alcoholic beverages for 24 hours or as instructed.  MEDICATIONS  You may resume your normal medications unless your doctor tells you otherwise.  WHAT YOU CAN EXPECT TODAY  Some feelings of bloating in the abdomen.   Passage of more gas than usual.   Spotting of blood in your stool or on the toilet paper.  IF YOU HAD POLYPS REMOVED DURING THE COLONOSCOPY:  No aspirin products for 7 days or as instructed.   No alcohol for 7 days or as instructed.   Eat a soft diet for the next 24 hours.  FINDING OUT THE RESULTS OF YOUR TEST Not all test results are available during your visit. If your test results are not back during the visit, make an appointment  with your caregiver to find out the results. Do not assume everything is normal if you have not heard from your caregiver or the medical facility. It is important for you to follow up on all of your test results.  SEEK IMMEDIATE MEDICAL ATTENTION IF:  You have more than a spotting of blood in your stool.   Your belly is swollen (abdominal distention).   You are nauseated or vomiting.   You have a temperature over 101.   You have abdominal pain or discomfort that is severe or gets worse throughout the day.    Diverticulosis and polyp information provided  Hemorrhoid information provided  Add Benefiber 1 tablespoon twice daily to regimen  Further recommendations to follow pending review of pathology report Hemorrhoids Hemorrhoids are puffy (swollen) veins around the rectum or anus. Hemorrhoids can cause pain, itching, bleeding, or irritation. HOME CARE Eat foods with fiber, such as whole grains, beans, nuts, fruits, and vegetables. Ask your doctor about taking products with added fiber in them (fibersupplements). Drink enough fluid to keep your pee (urine) clear or pale yellow. Exercise often. Go to the bathroom when you have the urge to poop. Do not wait. Avoid straining to poop (bowel movement). Keep the butt area dry and clean. Use wet toilet paper or moist paper towels. Medicated creams and medicine inserted into the anus (anal suppository) may be used or applied as told. Only take medicine as told by your doctor. Take a  warm water bath (sitz bath) for 15-20 minutes to ease pain. Do this 3-4 times a day. Place ice packs on the area if it is tender or puffy. Use the ice packs between the warm water baths. Put ice in a plastic bag. Place a towel between your skin and the bag. Leave the ice on for 15-20 minutes, 03-04 times a day. Do not use a donut-shaped pillow or sit on the toilet for a long time. GET HELP RIGHT AWAY IF:  You have more pain that is not controlled by  treatment or medicine. You have bleeding that will not stop. You have trouble or are unable to poop (bowel movement). You have pain or puffiness outside the area of the hemorrhoids. MAKE SURE YOU:  Understand these instructions. Will watch your condition. Will get help right away if you are not doing well or get worse.   This information is not intended to replace advice given to you by your health care provider. Make sure you discuss any questions you have with your health care provider.   Document Released: 04/30/2008 Document Revised: 07/08/2012 Document Reviewed: 06/02/2012 Elsevier Interactive Patient Education 2016 [employer] Colon Polyps Polyps are lumps of extra tissue growing inside the body. Polyps can grow in the large intestine (colon). Most colon polyps are noncancerous (benign). However, some colon polyps can become cancerous over time. Polyps that are larger than a pea may be harmful. To be safe, caregivers remove and test all polyps. CAUSES  Polyps form when mutations in the genes cause your cells to grow and divide even though no more tissue is needed. RISK FACTORS There are a number of risk factors that can increase your chances of getting colon polyps. They include:  Being older than 50 years.  Family history of colon polyps or colon cancer.  Long-term colon diseases, such as colitis or Crohn disease.  Being overweight.  Smoking.  Being inactive.  Drinking too much alcohol. SYMPTOMS  Most small polyps do not cause symptoms. If symptoms are present, they may include:  Blood in the stool. The stool may look dark red or black.  Constipation or diarrhea that lasts longer than 1 week. DIAGNOSIS People often do not know they have polyps until their caregiver finds them during a regular checkup. Your caregiver can use 4 tests to check for polyps:  Digital rectal exam. The caregiver wears gloves and feels inside the rectum. This test would find polyps only in  the rectum.  Barium enema. The caregiver puts a liquid called barium into your rectum before taking X-rays of your colon. Barium makes your colon look white. Polyps are dark, so they are easy to see in the X-ray pictures.  Sigmoidoscopy. A thin, flexible tube (sigmoidoscope) is placed into your rectum. The sigmoidoscope has a light and tiny camera in it. The caregiver uses the sigmoidoscope to look at the last third of your colon.  Colonoscopy. This test is like sigmoidoscopy, but the caregiver looks at the entire colon. This is the most common method for finding and removing polyps. TREATMENT  Any polyps will be removed during a sigmoidoscopy or colonoscopy. The polyps are then tested for cancer. PREVENTION  To help lower your risk of getting more colon polyps:  Eat plenty of fruits and vegetables. Avoid eating fatty foods.  Do not smoke.  Avoid drinking alcohol.  Exercise every day.  Lose weight if recommended by your caregiver.  Eat plenty of calcium and folate. Foods that are rich in calcium  include milk, cheese, and broccoli. Foods that are rich in folate include chickpeas, kidney beans, and spinach. HOME CARE INSTRUCTIONS Keep all follow-up appointments as directed by your caregiver. You may need periodic exams to check for polyps. SEEK MEDICAL CARE IF: You notice bleeding during a bowel movement.   This information is not intended to replace advice given to you by your health care provider. Make sure you discuss any questions you have with your health care provider.   Document Released: 04/17/2004 Document Revised: 08/12/2014 Document Reviewed: 10/01/2011 Elsevier Interactive Patient Education 2016 [employer]. Diverticulosis Diverticulosis is the condition that develops when small pouches (diverticula) form in the wall of your colon. Your colon, or large intestine, is where water is absorbed and stool is formed. The pouches form when the inside layer of your colon pushes  through weak spots in the outer layers of your colon. CAUSES  No one knows exactly what causes diverticulosis. RISK FACTORS  Being older than 35. Your risk for this condition increases with age. Diverticulosis is rare in people younger than 40 years. By age 55, almost everyone has it.  Eating a low-fiber diet.  Being frequently constipated.  Being overweight.  Not getting enough exercise.  Smoking.  Taking over-the-counter pain medicines, like aspirin and ibuprofen. SYMPTOMS  Most people with diverticulosis do not have symptoms. DIAGNOSIS  Because diverticulosis often has no symptoms, health care providers often discover the condition during an exam for other colon problems. In many cases, a health care provider will diagnose diverticulosis while using a flexible scope to examine the colon (colonoscopy). TREATMENT  If you have never developed an infection related to diverticulosis, you may not need treatment. If you have had an infection before, treatment may include:  Eating more fruits, vegetables, and grains.  Taking a fiber supplement.  Taking a live bacteria supplement (probiotic).  Taking medicine to relax your colon. HOME CARE INSTRUCTIONS   Drink at least 6-8 glasses of water each day to prevent constipation.  Try not to strain when you have a bowel movement.  Keep all follow-up appointments. If you have had an infection before:  Increase the fiber in your diet as directed by your health care provider or dietitian.  Take a dietary fiber supplement if your health care provider approves.  Only take medicines as directed by your health care provider. SEEK MEDICAL CARE IF:   You have abdominal pain.  You have bloating.  You have cramps.  You have not gone to the bathroom in 3 days. SEEK IMMEDIATE MEDICAL CARE IF:   Your pain gets worse.  Yourbloating becomes very bad.  You have a fever or chills, and your symptoms suddenly get worse.  You begin  vomiting.  You have bowel movements that are bloody or black. MAKE SURE YOU:  Understand these instructions.  Will watch your condition.  Will get help right away if you are not doing well or get worse.   This information is not intended to replace advice given to you by your health care provider. Make sure you discuss any questions you have with your health care provider.   Document Released: 04/18/2004 Document Revised: 07/27/2013 Document Reviewed: 06/16/2013 Elsevier Interactive Patient Education 2016 [place] appointment with me in 4-6 weeks for banding of hemorrhoids September 21, 2015 @ 0930; if for some reason you need to change this then give Dr Roseanne Kaufman office a call [phone]

## 2015-07-05 NOTE — Interval H&P Note (Signed)
History and Physical Interval Note:  07/05/2015 12:24 PM  [name] [name]  has presented today for surgery, with the diagnosis of rectal bleeding, family history of colon cancer  The various methods of treatment have been discussed with the patient and family. After consideration of risks, benefits and other options for treatment, the patient has consented to  Procedure(s) with comments: COLONOSCOPY (N/A) - 1230 as a surgical intervention .  The patient's history has been reviewed, patient examined, no change in status, stable for surgery.  I have reviewed the patient's chart and labs.  Questions were answered to the patient's satisfaction.     Robert Rourk  No change. Diagnostic colonoscopy per plan.  The risks, benefits, limitations, alternatives and imponderables have been reviewed with the patient. Questions have been answered. All parties are agreeable.

## 2015-07-05 NOTE — H&P (View-Only) (Signed)
Primary Care Physician:  Robert Bellow, MD Primary Gastroenterologist:  [name]  Chief Complaint  Patient presents with  . Rectal Bleeding    HPI:   54 year old female referred by primary care for rectal bleeding. PCP notes reviewed. Patient last visit with her PCP was on 05/10/2015 for continued rectal bleeding. She had been given Anusol suppositories which per the PCP note the patient states was helping however she bleeds if she does not use them. Previously she was having about a teaspoon of bright red blood in a time. Rectal exam completed which "found nothing" and the patient was subsequently referred for further evaluation.  Today she states the suppositories are helping but she continues to bleed despite the suppositories. Has never had a colonoscopy. Admits some rectal burning and itching but it's mild. Last episode of hematochezia was yesterday and was minimal. Day before yesterday was "worse." Is also having some diarrhea and rectal itching. Denies abdominal pain, vomiting. Admits some nausea. Denies fever, chills, unintentional weight loss. Has had a change in her bowel habits to frequent stools, looser stools which started about the time her rectal bleeding started. Denies chest pain, dyspnea, dizziness, lightheadedness, syncope, near syncope. Denies any other upper or lower GI symptoms.  Past Medical History  Diagnosis Date  . Obsessive-compulsive disorders   . Postmenopausal bleeding   . Essential hypertension, benign   . Schizoaffective disorder ([place])   . BV (bacterial vaginosis) 01/28/2013    Past Surgical History  Procedure Laterality Date  . Bunionectomy      Current Outpatient Prescriptions  Medication Sig Dispense Refill  . calcium citrate-vitamin D (CITRACAL+D) 315-200 MG-UNIT per tablet Take 2 tablets by mouth daily.     . divalproex (DEPAKOTE) 500 MG DR tablet Take 500 mg by mouth 2 (two) times daily.     [name] FLUoxetine (PROZAC) 40 MG capsule Take 40 mg  by mouth daily.     . hydrochlorothiazide (HYDRODIURIL) 25 MG tablet Take 25 mg by mouth daily.    [name] ibuprofen (ADVIL,MOTRIN) 200 MG tablet Take 400 mg by mouth 2 (two) times daily as needed for headache.    . imipramine (TOFRANIL) 50 MG tablet Take 50 mg by mouth at bedtime.     [name] lisinopril (PRINIVIL,ZESTRIL) 5 MG tablet Take 5 mg by mouth daily.    . Multiple Vitamin (MULTIVITAMIN) tablet Take 1 tablet by mouth daily.    . ziprasidone (GEODON) 80 MG capsule Take 80 mg by mouth 2 (two) times daily with a meal.     . HYDROcodone-acetaminophen (NORCO) 5-325 MG per tablet Take 2 tablets by mouth every 6 (six) hours as needed for moderate pain. (Patient not taking: Reported on 04/24/2015) 45 tablet 0  . sulfamethoxazole-trimethoprim (BACTRIM DS) 800-160 MG per tablet Take 1 tablet by mouth 2 (two) times daily. (Patient not taking: Reported on 04/24/2015) 20 tablet 0   No current facility-administered medications for this visit.    Allergies as of 06/21/2015 - Review Complete 06/21/2015  Allergen Reaction Noted  . Penicillins Rash 04/18/2012    Family History  Problem Relation Age of Onset  . Diabetes type II Maternal Grandmother   . Diabetes Maternal Grandmother   . Hypertension Maternal Aunt     Social History   Social History  . Marital Status: Divorced    Spouse Name: N/A  . Number of Children: N/A  . Years of Education: N/A   Occupational History  . Not on file.   Social History Main  Topics  . Smoking status: Never Smoker   . Smokeless tobacco: Never Used  . Alcohol Use: No  . Drug Use: No  . Sexual Activity: Not Currently    Birth Control/ Protection: Post-menopausal   Other Topics Concern  . Not on file   Social History Narrative    Review of Systems: General: Negative for anorexia, weight loss, fever, chills, fatigue, weakness. Eyes: Negative for vision changes.  ENT: Negative for hoarseness, difficulty swallowing. CV: Negative for chest pain, angina,  palpitations, peripheral edema.  Respiratory: Negative for dyspnea at rest, cough, sputum, wheezing.  GI: See history of present illness. Derm: Negative for rash or itching.  Endo: Negative for unusual weight change.  Heme: Negative for bruising or bleeding. Allergy: Negative for rash or hives.    Physical Exam: BP 136/75 mmHg  Pulse 87  Temp(Src) 97.6 F (36.4 C) (Oral)  Ht 5\' 4"  (1.626 m)  Wt 188 lb 9.6 oz (85.548 kg)  BMI 32.36 kg/m2  LMP 03/18/2012 General:   Alert and oriented. Pleasant and cooperative. Well-nourished and well-developed.  Head:  Normocephalic and atraumatic. Eyes:  Without icterus, sclera clear and conjunctiva pink.  Ears:  Normal auditory acuity. Mouth:  No deformity or lesions, oral mucosa pink.  Throat/Neck:  Supple, without mass or thyromegaly. Cardiovascular:  S1, S2 present without murmurs appreciated. Extremities without clubbing or edema. Respiratory:  Clear to auscultation bilaterally. No wheezes, rales, or rhonchi. No distress.  Gastrointestinal:  +BS, rounded but soft, non-tender and non-distended. No HSM noted. No guarding or rebound. No masses appreciated.  Rectal:  Deferred  Musculoskalatal:  Symmetrical without gross deformities. Normal posture. Skin:  Intact without significant lesions or rashes. Neurologic:  Alert and oriented x4;  grossly normal neurologically. Psych:  Alert and cooperative. Normal mood and affect.    06/21/2015 8:22 AM

## 2015-07-07 ENCOUNTER — Encounter: Payer: Self-pay | Admitting: Internal Medicine

## 2015-07-10 ENCOUNTER — Encounter (HOSPITAL_COMMUNITY): Payer: Self-pay | Admitting: Internal Medicine

## 2015-07-12 ENCOUNTER — Telehealth: Payer: Self-pay | Admitting: Internal Medicine

## 2015-07-12 NOTE — Telephone Encounter (Signed)
Tried to call pt at the number given. NA and could not get thru to the directory to leave her a message. Per RMR procedure note, pt needed ov in 4-6 weeks for a hemorrhoid banding. Please schedule this ov.   Dr.Rourk- is there anything you can send in for her to help until she comes in for a banding?

## 2015-07-12 NOTE — Telephone Encounter (Signed)
Pt called late this afternoon to say that she had a colonoscopy on Nov 30 and has started having some burning and bleeding. She is due to come back in February. What would we recommend. Please advise. [phone] until 530 pm and [phone] in the morning

## 2015-07-13 MED ORDER — HYDROCORTISONE 2.5 % RE CREA: 1.0000 "application " | TOPICAL_CREAM | Freq: Three times a day (TID) | RECTAL | Status: DC

## 2015-07-13 NOTE — Telephone Encounter (Signed)
Pt is aware. rx has been sent in. 

## 2015-07-13 NOTE — Telephone Encounter (Signed)
It no allergies or intolerances to Anusol cream, can call in a prescription for Anusol cream. One unit. Apply to anorectum 3 times a day when necessary

## 2015-07-13 NOTE — Telephone Encounter (Signed)
Tried to call the other number that was given to call this morning- NA- LMOM

## 2015-07-13 NOTE — Telephone Encounter (Signed)
OV made for banding on 12/13 at 4 with RMR

## 2015-07-13 NOTE — Addendum Note (Signed)
Addended by: Claudina Lick on: 07/13/2015 11:14 AM   Modules accepted: Orders

## 2015-07-14 ENCOUNTER — Ambulatory Visit (INDEPENDENT_AMBULATORY_CARE_PROVIDER_SITE_OTHER): Payer: 59 | Admitting: Psychiatry

## 2015-07-14 ENCOUNTER — Encounter (HOSPITAL_COMMUNITY): Payer: Self-pay | Admitting: Psychiatry

## 2015-07-14 DIAGNOSIS — F251 Schizoaffective disorder, depressive type: Secondary | ICD-10-CM
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
## 2015-07-14 NOTE — Patient Instructions (Signed)
Discussed orally 

## 2015-07-18 ENCOUNTER — Encounter: Payer: Self-pay | Admitting: Internal Medicine

## 2015-07-18 ENCOUNTER — Ambulatory Visit (INDEPENDENT_AMBULATORY_CARE_PROVIDER_SITE_OTHER): Payer: 59 | Admitting: Internal Medicine

## 2015-07-18 VITALS — BP 118/77 | HR 98 | Temp 97.6°F | Ht 62.0 in | Wt 187.2 lb

## 2015-07-18 DIAGNOSIS — K649 Unspecified hemorrhoids: Secondary | ICD-10-CM | POA: Diagnosis not present

## 2015-07-18 DIAGNOSIS — Z86010 Personal history of colonic polyps: Secondary | ICD-10-CM

## 2015-07-18 NOTE — Progress Notes (Signed)
Primary Care Physician:  Lelend Heinecke Bellow, MD Primary Gastroenterologist:  [name]  Pre-Procedure History & Physical: HPI:  [name] [name] is a 54 y.o. female here for follow-up of recent colonoscopy; procedure demonstrated an adenoma and diverticulosis. Redundant colon. She will be due for surveillance colonoscopy 5 years. Continues to have intermittent bleeding, itching, burning, leakage and fecal soilage attributable to internal hemorrhoids. Denies constipation ; Has not  started a fiber supplement. She comes today to discuss the various treatment options for hemorrhoids including banding.  Failed topical Anusol previously.  Past Medical History  Diagnosis Date  . Obsessive-compulsive disorders   . Postmenopausal bleeding   . Essential hypertension, benign   . Schizoaffective disorder ([place])   . BV (bacterial vaginosis) 01/28/2013  . Tubular adenoma   . Hemorrhoids     Past Surgical History  Procedure Laterality Date  . Bunionectomy    . Colonoscopy N/A 07/05/2015    Dr.Karena Kinker- internal hemorrhoids, redundant colon, colionic dierticulosis, colonic polyp. bx= tubular adenoma    Prior to Admission medications   Medication Sig Start Date End Date Taking? Authorizing Provider  calcium citrate-vitamin D (CITRACAL+D) 315-200 MG-UNIT per tablet Take 2 tablets by mouth daily.    Yes Historical Provider, MD  divalproex (DEPAKOTE) 500 MG DR tablet Take 500 mg by mouth 2 (two) times daily.    Yes Historical Provider, MD  FLUoxetine (PROZAC) 40 MG capsule Take 40 mg by mouth daily.    Yes Historical Provider, MD  hydrochlorothiazide (HYDRODIURIL) 25 MG tablet Take 25 mg by mouth daily.   Yes Historical Provider, MD  hydrocortisone (ANUSOL-HC) 2.5 % rectal cream Place 1 application rectally 2 (two) times daily. UP TO 10 DAYS AT A TIME 06/21/15  Yes Carlis Stable, NP  hydrocortisone (ANUSOL-HC) 2.5 % rectal cream Place 1 application rectally 3 (three) times daily. prn 07/13/15  Yes Daneil Dolin, MD  ibuprofen (ADVIL,MOTRIN) 200 MG tablet Take 400 mg by mouth 2 (two) times daily as needed for headache.   Yes Historical Provider, MD  imipramine (TOFRANIL) 50 MG tablet Take 50 mg by mouth at bedtime.  05/20/12  Yes Historical Provider, MD  lisinopril (PRINIVIL,ZESTRIL) 5 MG tablet Take 5 mg by mouth daily.   Yes Historical Provider, MD  Multiple Vitamin (MULTIVITAMIN) tablet Take 1 tablet by mouth daily.   Yes Historical Provider, MD  ziprasidone (GEODON) 80 MG capsule Take 80 mg by mouth 2 (two) times daily with a meal.    Yes Historical Provider, MD  polyethylene glycol-electrolytes (NULYTELY/GOLYTELY) 420 G solution Take 4,000 mLs by mouth once. Patient not taking: Reported on 07/18/2015 06/21/15   Carlis Stable, NP    Allergies as of 07/18/2015 - Review Complete 07/18/2015  Allergen Reaction Noted  . Penicillins Rash 04/18/2012    Family History  Problem Relation Age of Onset  . Diabetes type II Maternal Grandmother   . Diabetes Maternal Grandmother   . Hypertension Maternal Aunt   . Colon cancer Mother 66    Social History   Social History  . Marital Status: Divorced    Spouse Name: N/A  . Number of Children: N/A  . Years of Education: N/A   Occupational History  . Not on file.   Social History Main Topics  . Smoking status: Never Smoker   . Smokeless tobacco: Never Used  . Alcohol Use: No  . Drug Use: No  . Sexual Activity: Not Currently    Birth Control/ Protection: Post-menopausal  Other Topics Concern  . Not on file   Social History Narrative    Review of Systems: See HPI, otherwise negative ROS  Physical Exam: BP 118/77 mmHg  Pulse 98  Temp(Src) 97.6 F (36.4 C) (Oral)  Ht 5\' 2"  (1.575 m)  Wt 187 lb 3.2 oz (84.913 kg)  BMI 34.23 kg/m2  LMP 03/18/2012 General:   Alert,  , pleasant and cooperative in NAD Skin:  Intact without significant lesions or rashes. Perianal examination: Focal erythema/redness around the anal opening. No  prolapsing papilla or hemorrhoids observed.   Impression:    Pleasant 54 year old with a positive family history colon cancer,  personal history of colonic adenoma recently removed. Due for surveillance colonoscopy 2021. Symptomatic hemorrhoids as described. Inadequate fiber intake. She would likely benefit from hemorrhoidal banding. However, she was tried on substantial fiber supplement and reassess symptoms in the appropriate interval prior to embarking on hemorrhoid banding. This is certainly not unreasonable. She likely has a perianal tinea infection.  Recommendations: Avoid straining.  Benefiber 1 tablespoon twice daily  Limit toilet time to 5 minutes  Used miconazole 2% cream as directed 2. Anal area  Call with any interim problems  Schedule followup appointment in 3 months from now  Colonoscopy 2021     Notice: This dictation was prepared with Dragon dictation along with smaller phrase technology. Any transcriptional errors that result from this process are unintentional and may not be corrected upon review.

## 2015-07-18 NOTE — Patient Instructions (Signed)
Avoid straining.  Benefiber 1 tablespoon twice daily  Limit toilet time to 5 minutes  Used miconazole 2% cream as directed 2. Anal area  Call with any interim problems  Schedule followup appointment in 3 months from now

## 2015-07-20 ENCOUNTER — Telehealth: Payer: Self-pay

## 2015-07-20 NOTE — Telephone Encounter (Signed)
PATIENT SCHEDULED  °

## 2015-07-20 NOTE — Telephone Encounter (Signed)
Pt called- left voicemail- she did not have her hemorrhoid banding on Tuesday, she said now she wishes she would have had it done. She is now c/o burning and bleeding. Pt needs to come in for a banding. Please schedule her for a banding.

## 2015-07-25 ENCOUNTER — Encounter: Payer: Self-pay | Admitting: Internal Medicine

## 2015-07-25 ENCOUNTER — Ambulatory Visit (INDEPENDENT_AMBULATORY_CARE_PROVIDER_SITE_OTHER): Payer: 59 | Admitting: Internal Medicine

## 2015-07-25 VITALS — BP 133/72 | HR 93 | Temp 97.0°F | Ht 62.0 in | Wt 187.2 lb

## 2015-07-25 DIAGNOSIS — K648 Other hemorrhoids: Secondary | ICD-10-CM

## 2015-07-25 NOTE — Patient Instructions (Signed)
Avoid straining.  Benefiber 1 tablespoon twice daily  Limit toilet time to 2-5 minutes  Call with any interim problems  Schedule followup appointment in 3 weeks from now

## 2015-07-25 NOTE — Progress Notes (Signed)
[place] banding procedure note:  The patient presents with symptomatic  hemorrhoids, unresponsive to maximal medical therapy, Seen in the office felt to have a tinea infection.-Treated with topical agents. Patient wanted to go with conservative treatment for time. She went home and decided she really needed to have banding done and she presents today to proceed. All risks, benefits, and alternative forms of therapy were described and informed consent was obtained.  In the left lateral decubitus position, anoscopy revealed a prominent right posterior hemorrhoid column.  The decision was made to band the right posterior internal hemorrhoid;  the [place] was used to perform band ligation without complication. Digital anorectal examination was then performed to assure proper positioning of the band; found to be in excellent position. No pinching or pain after deploymen. The patient was discharged home without pain or other issues. Dietary and behavioral recommendations were given along with follow-up instructions. The patient will return in 3 for followup and possible additional banding as required.  No complications were encountered and the patient tolerated the procedure well.

## 2015-08-04 ENCOUNTER — Ambulatory Visit (INDEPENDENT_AMBULATORY_CARE_PROVIDER_SITE_OTHER): Payer: 59 | Admitting: Psychiatry

## 2015-08-04 ENCOUNTER — Encounter (HOSPITAL_COMMUNITY): Payer: Self-pay | Admitting: Psychiatry

## 2015-08-04 DIAGNOSIS — F251 Schizoaffective disorder, depressive type: Secondary | ICD-10-CM

## 2015-08-04 NOTE — Patient Instructions (Signed)
Discussed orally 

## 2015-08-04 NOTE — Progress Notes (Signed)
            THERAPIST PROGRESS NOTE   Session Time:    Friday 08/04/2015 9:03 AM - 9:45 AM                 Participation Level: Active  Behavioral Response: CasualAlert/euthymic  Type of Therapy: Individual Therapy  Treatment Goals :    1.Learn implement effective communication skills with family and significant others   Treatment Goals Addressed: 1.   Interventions: CBT and Supportive, psychoeducation  Summary: [name] [name] is a 54 y.o. female who presents with presents with a long-standing history of symptoms of anxiety and depression, a previous diagnosis of schizoaffective disorder, and reports a history of at least 3 psychiatric hospitalizations from 89 through 1990. Patient 's symptoms have included depressed mood, tearfulness, anxiety, insomnia, guilt, and excessive worrying.   Patient reports improved mood and continued improved use of assertiveness skills since last session. She states now feeling better about the relationship with her aunt and being more in control of her choices. She also reports improved efforts in setting boundaries with her cousin. Patient states feeling better about self as she is standing up for self. She also reports less negative thinking. She has attended church once since last session and plans to go more often when she can. However, she states  not beating up on self when she doesn't attend.  Patient enjoyed celebrating Christmas with family and is optimistic about beginning a new year. She reports no depression since las session.   Suicidal/Homicidal:  No  Therapist Response: Therapist works with patient to review symptoms, process feelings, praise and reinforce use of assertiveness skills and identify effects of using assertiveness skills, begin to discuss lapse versus relapse, identify relapse prevention skills  Plan: Return again in 4 weeks.   Diagnosis: Axis I: Schizoaffective Disorder    Axis II: No  diagnosis     Tennyson Kallen, LCSW 08/04/2015

## 2015-08-06 HISTORY — PX: HEMORRHOID BANDING: SHX5850

## 2015-08-22 ENCOUNTER — Ambulatory Visit: Payer: 59 | Admitting: Internal Medicine

## 2015-09-01 ENCOUNTER — Ambulatory Visit (INDEPENDENT_AMBULATORY_CARE_PROVIDER_SITE_OTHER): Payer: BLUE CROSS/BLUE SHIELD | Admitting: Psychiatry

## 2015-09-01 DIAGNOSIS — F251 Schizoaffective disorder, depressive type: Secondary | ICD-10-CM

## 2015-09-01 NOTE — Patient Instructions (Signed)
Discussed orally 
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
## 2015-09-05 ENCOUNTER — Encounter: Payer: Self-pay | Admitting: Internal Medicine

## 2015-09-05 ENCOUNTER — Ambulatory Visit (INDEPENDENT_AMBULATORY_CARE_PROVIDER_SITE_OTHER): Payer: BLUE CROSS/BLUE SHIELD | Admitting: Internal Medicine

## 2015-09-05 VITALS — BP 139/80 | HR 82 | Temp 97.1°F | Ht 62.0 in | Wt 189.6 lb

## 2015-09-05 DIAGNOSIS — K648 Other hemorrhoids: Secondary | ICD-10-CM

## 2015-09-05 NOTE — Progress Notes (Signed)
[place] banding procedure note:  The patient presents with symptomatic status post banding of the right posterior column a few weeks ago. Doing well. Desires second band placement today. Risks, benefits alternatives reviewed. Questions answered. Patient agreeable. In the left lateral decubitus position, DRE reveals no abnormalities.  The decision was made to band the left lateral internal hemorrhoid;  the [place] was used to perform band ligation without complication. Digital anorectal examination was then performed to assure proper positioning of the band;  Band found to be in excellent position. No pinching or pain;  The patient was discharged home without pain or other issues. Dietary and behavioral recommendations were given  along with follow-up instructions. The patient will return in 4 weeks for followup and possible additional banding as required.  No complications were encountered and the patient tolerated the procedure well.

## 2015-09-05 NOTE — Patient Instructions (Signed)
Avoid straining.  Benefiber 1 tablespoon twice daily  Limit toilet time to 2-5 minutes  Call with any interim problems  Schedule followup appointment in 4 weeks from now

## 2015-09-07 ENCOUNTER — Other Ambulatory Visit (HOSPITAL_COMMUNITY): Payer: Self-pay | Admitting: Family Medicine

## 2015-09-07 DIAGNOSIS — Z1231 Encounter for screening mammogram for malignant neoplasm of breast: Secondary | ICD-10-CM

## 2015-09-21 ENCOUNTER — Ambulatory Visit: Payer: Self-pay | Admitting: Nurse Practitioner

## 2015-09-26 ENCOUNTER — Ambulatory Visit: Payer: Self-pay | Admitting: Internal Medicine

## 2015-09-29 ENCOUNTER — Ambulatory Visit (HOSPITAL_COMMUNITY): Payer: Self-pay | Admitting: Psychiatry

## 2015-10-02 ENCOUNTER — Ambulatory Visit (HOSPITAL_COMMUNITY): Payer: Self-pay | Admitting: Psychiatry

## 2015-10-09 ENCOUNTER — Ambulatory Visit (HOSPITAL_COMMUNITY): Payer: Self-pay

## 2015-10-16 ENCOUNTER — Ambulatory Visit: Payer: Self-pay | Admitting: Nurse Practitioner

## 2015-10-17 ENCOUNTER — Ambulatory Visit: Payer: Self-pay | Admitting: Nurse Practitioner

## 2015-10-19 ENCOUNTER — Ambulatory Visit (INDEPENDENT_AMBULATORY_CARE_PROVIDER_SITE_OTHER): Payer: BLUE CROSS/BLUE SHIELD | Admitting: Psychiatry

## 2015-10-19 DIAGNOSIS — F251 Schizoaffective disorder, depressive type: Secondary | ICD-10-CM | POA: Diagnosis not present
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
## 2015-10-19 NOTE — Patient Instructions (Signed)
Discussed orally 

## 2015-11-09 ENCOUNTER — Ambulatory Visit (INDEPENDENT_AMBULATORY_CARE_PROVIDER_SITE_OTHER): Payer: BLUE CROSS/BLUE SHIELD | Admitting: Psychiatry

## 2015-11-09 ENCOUNTER — Encounter (HOSPITAL_COMMUNITY): Payer: Self-pay | Admitting: Psychiatry

## 2015-11-09 DIAGNOSIS — F251 Schizoaffective disorder, depressive type: Secondary | ICD-10-CM
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
## 2015-11-09 NOTE — Patient Instructions (Signed)
Discussed orally 

## 2015-11-20 ENCOUNTER — Ambulatory Visit (HOSPITAL_COMMUNITY): Payer: Self-pay

## 2015-11-24 ENCOUNTER — Encounter (HOSPITAL_COMMUNITY): Payer: Self-pay | Admitting: Psychiatry

## 2015-11-24 ENCOUNTER — Ambulatory Visit (INDEPENDENT_AMBULATORY_CARE_PROVIDER_SITE_OTHER): Payer: BLUE CROSS/BLUE SHIELD | Admitting: Psychiatry

## 2015-11-24 DIAGNOSIS — F251 Schizoaffective disorder, depressive type: Secondary | ICD-10-CM | POA: Diagnosis not present
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
## 2015-11-24 NOTE — Patient Instructions (Signed)
Discussed orally 

## 2015-11-28 ENCOUNTER — Ambulatory Visit (HOSPITAL_COMMUNITY): Payer: Self-pay | Admitting: Psychiatry

## 2015-12-12 ENCOUNTER — Ambulatory Visit (INDEPENDENT_AMBULATORY_CARE_PROVIDER_SITE_OTHER): Payer: BLUE CROSS/BLUE SHIELD | Admitting: Psychiatry

## 2015-12-12 ENCOUNTER — Encounter (HOSPITAL_COMMUNITY): Payer: Self-pay | Admitting: Psychiatry

## 2015-12-12 DIAGNOSIS — F251 Schizoaffective disorder, depressive type: Secondary | ICD-10-CM

## 2015-12-12 NOTE — Patient Instructions (Signed)
Discussed orally 
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
## 2015-12-28 ENCOUNTER — Telehealth: Payer: Self-pay

## 2015-12-28 NOTE — Telephone Encounter (Signed)
Pt called- she has had 3 bandings and she is still having problems with her hemorrhoids. She is still bleeding and itching. She said if she takes the benefiber as instructed, she has diarrhea. She wants to know if there is anything else we can do?

## 2015-12-29 NOTE — Telephone Encounter (Signed)
Patient called to see if RMR had said anything and I told her that he was out of the office today and for her to keep using the cream and if she had any problems to call the hospital or go to the ER.

## 2015-12-30 NOTE — Telephone Encounter (Signed)
Recommend miconazole cream for itching; Ov w EG to re-assess ano-rectal sx

## 2016-01-03 ENCOUNTER — Encounter (HOSPITAL_COMMUNITY): Payer: Self-pay | Admitting: Psychiatry

## 2016-01-03 ENCOUNTER — Ambulatory Visit (INDEPENDENT_AMBULATORY_CARE_PROVIDER_SITE_OTHER): Payer: BLUE CROSS/BLUE SHIELD | Admitting: Psychiatry

## 2016-01-03 DIAGNOSIS — F251 Schizoaffective disorder, depressive type: Secondary | ICD-10-CM | POA: Diagnosis not present
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
## 2016-01-03 NOTE — Patient Instructions (Signed)
Discussed orally 

## 2016-01-04 NOTE — Telephone Encounter (Signed)
Tried to call pt- NA- LMOM 

## 2016-01-05 ENCOUNTER — Encounter: Payer: Self-pay | Admitting: Internal Medicine

## 2016-01-05 NOTE — Telephone Encounter (Signed)
APPT MADE AND LETTER SENT  °

## 2016-01-05 NOTE — Telephone Encounter (Signed)
Called pts home number- NA-LMOM with recommendations. Tried to call pts cell number- NA-no voicemail.  Please schedule ov

## 2016-01-16 ENCOUNTER — Ambulatory Visit (HOSPITAL_COMMUNITY): Payer: Self-pay | Admitting: Psychiatry

## 2016-01-19 ENCOUNTER — Encounter: Payer: Self-pay | Admitting: Gastroenterology

## 2016-01-19 ENCOUNTER — Ambulatory Visit (INDEPENDENT_AMBULATORY_CARE_PROVIDER_SITE_OTHER): Payer: BLUE CROSS/BLUE SHIELD | Admitting: Gastroenterology

## 2016-01-19 VITALS — BP 119/75 | HR 88 | Temp 97.3°F | Ht 62.0 in | Wt 190.4 lb

## 2016-01-19 DIAGNOSIS — K649 Unspecified hemorrhoids: Secondary | ICD-10-CM

## 2016-01-19 NOTE — Assessment & Plan Note (Signed)
Continues to have symptomatic hemorrhoidal disease. She will return for [facility] banding with [name] in the near future.

## 2016-01-19 NOTE — Patient Instructions (Signed)
1. We will bring her back on [name] scheduled to complete your hemorrhoid bandings.  2. HAPPY BIRTHDAY!!!!!

## 2016-01-19 NOTE — Progress Notes (Signed)
Primary Care Physician: Robert Bellow, MD  Primary Gastroenterologist:  Garfield Cornea, MD   Chief Complaint  Patient presents with  . Follow-up    HPI: [name] [name] is a 55 y.o. female hereFor follow-up. She continues to have symptomatic hemorrhoidal disease described as itching, rectal bleeding, fecal soilage. She has had hemorrhoid banding 2, left lateral internal hemorrhoid and right posterior internal hemorrhoid. She is interested and pursuing third banding. She had to reschedule multiple appointments over the last few months but finally presents today.  Bowel movements are regular. Stools are soft for the most part. She had drop back on a Benefiber because of diarrhea. She continues to have intermittent bright red blood per rectum. She has 2 clean up fecal soilage every time she goes to the bathroom to urinate.  Patient reports inadequate conscious sedation with her recent colonoscopy.     Current Outpatient Prescriptions  Medication Sig Dispense Refill  . calcium citrate-vitamin D (CITRACAL+D) 315-200 MG-UNIT per tablet Take 2 tablets by mouth daily.     . divalproex (DEPAKOTE) 500 MG DR tablet Take 500 mg by mouth 2 (two) times daily.     [name] FLUoxetine (PROZAC) 40 MG capsule Take 40 mg by mouth daily.     . hydrochlorothiazide (HYDRODIURIL) 25 MG tablet Take 25 mg by mouth daily.    . hydrocortisone (ANUSOL-HC) 2.5 % rectal cream Place 1 application rectally 2 (two) times daily. UP TO 10 DAYS AT A TIME 30 g 1  . ibuprofen (ADVIL,MOTRIN) 200 MG tablet Take 400 mg by mouth 2 (two) times daily as needed for headache.    . imipramine (TOFRANIL) 50 MG tablet Take 50 mg by mouth at bedtime.     [name] lisinopril (PRINIVIL,ZESTRIL) 5 MG tablet Take 5 mg by mouth daily.    . Multiple Vitamin (MULTIVITAMIN) tablet Take 1 tablet by mouth daily.    . Wheat Dextrin (BENEFIBER DRINK MIX PO) Take by mouth.    . ziprasidone (GEODON) 80 MG capsule Take 80 mg by mouth 2 (two) times  daily with a meal.      No current facility-administered medications for this visit.    Allergies as of 01/19/2016 - Review Complete 01/19/2016  Allergen Reaction Noted  . Penicillins Rash 04/18/2012    ROS:  General: Negative for anorexia, weight loss, fever, chills, fatigue, weakness. ENT: Negative for hoarseness, difficulty swallowing , nasal congestion. CV: Negative for chest pain, angina, palpitations, dyspnea on exertion, peripheral edema.  Respiratory: Negative for dyspnea at rest, dyspnea on exertion, cough, sputum, wheezing.  GI: See history of present illness. GU:  Negative for dysuria, hematuria, urinary incontinence, urinary frequency, nocturnal urination.  Endo: Negative for unusual weight change.    Physical Examination:   BP 119/75 mmHg  Pulse 88  Temp(Src) 97.3 F (36.3 C) (Oral)  Ht 5\' 2"  (1.575 m)  Wt 190 lb 5.8 oz (86.347 kg)  BMI 34.81 kg/m2  LMP 03/18/2012  General: Well-nourished, well-developed in no acute distress.  Eyes: No icterus. Mouth: Oropharyngeal mucosa moist and pink , no lesions erythema or exudate. Lungs: Clear to auscultation bilaterally.  Heart: Regular rate and rhythm, no murmurs rubs or gallops.  Abdomen: Bowel sounds are normal, nontender, nondistended, no hepatosplenomegaly or masses, no abdominal bruits or hernia , no rebound or guarding.   Extremities: No lower extremity edema. No clubbing or deformities. Neuro: Alert and oriented x 4   Skin: Warm and dry, no jaundice.   Psych: Alert  and cooperative, normal mood and affect.

## 2016-01-22 NOTE — Progress Notes (Signed)
cc'd to pcp 

## 2016-01-24 ENCOUNTER — Ambulatory Visit: Payer: Self-pay | Admitting: Nurse Practitioner

## 2016-01-30 ENCOUNTER — Ambulatory Visit: Payer: Self-pay | Admitting: Nurse Practitioner

## 2016-01-30 ENCOUNTER — Encounter (HOSPITAL_COMMUNITY): Payer: Self-pay | Admitting: Psychiatry

## 2016-01-30 ENCOUNTER — Ambulatory Visit (INDEPENDENT_AMBULATORY_CARE_PROVIDER_SITE_OTHER): Payer: BLUE CROSS/BLUE SHIELD | Admitting: Psychiatry

## 2016-01-30 DIAGNOSIS — F251 Schizoaffective disorder, depressive type: Secondary | ICD-10-CM | POA: Diagnosis not present

## 2016-01-30 NOTE — Progress Notes (Signed)
               THERAPIST PROGRESS NOTE   Session Time:     Tuesday 01/30/2016 9:05 AM - 9:52 AM          Participation Level: Active  Behavioral Response: CasualAlert/euthymic  Type of Therapy: Individual Therapy  Treatment Goals :    1.Learn implement effective communication skills with family and significant others   Treatment Goals Addressed: 1   Interventions: CBT and Supportive, psychoeducation  Summary: [name] [name] is a 55 y.o. female who presents with presents with a long-standing history of symptoms of anxiety and depression, a previous diagnosis of schizoaffective disorder, and reports a history of at least 3 psychiatric hospitalizations from 31 through 1990. Patient 's symptoms have included depressed mood, tearfulness, anxiety, insomnia, guilt, and excessive worrying.   Patient last was seen about 4 weeks ago.  She reports stable mood but continued frustration with her ex-husband, cousin, and her aunt. However, she is pleased she set boundaries with her ex-husband and cousin regarding alcohol use and her car.  She continues to improve assertiveness skills in some situations but continues to struggle in others. She cites recent incidents with her aunt in which she became upset with aunt and used aggressive late language rather than assertive language. She states feeling used by her aunt. She also reports feeling overly responsible for aunt and states feeling guilty when she doesn't do for aunt wants her to do.    Suicidal/Homicidal:  No.   Therapist Response: Reviewed symptoms, facilitated expression of feelings, assisted patient identify thought patterns inhibiting effective assertion in relationship with aunt, assisted patient reframe thought patterns to promote effective assertion, assisted patient identify situations in which she successfully used assertiveness skills and the effects on patient and her interaction with others,  and  did role play to improve  assertiveness skills in interaction with aunt  Plan: Return again in 3 weeks.  Diagnosis: Axis I: Schizoaffective Disorder    Axis II: No diagnosis     Yancy Knoble, LCSW 01/30/2016

## 2016-01-30 NOTE — Patient Instructions (Signed)
Discussed orally 

## 2016-02-20 ENCOUNTER — Encounter: Payer: Self-pay | Admitting: Internal Medicine

## 2016-02-20 ENCOUNTER — Ambulatory Visit (INDEPENDENT_AMBULATORY_CARE_PROVIDER_SITE_OTHER): Payer: BLUE CROSS/BLUE SHIELD | Admitting: Psychiatry

## 2016-02-20 ENCOUNTER — Encounter (HOSPITAL_COMMUNITY): Payer: Self-pay | Admitting: Psychiatry

## 2016-02-20 ENCOUNTER — Ambulatory Visit (INDEPENDENT_AMBULATORY_CARE_PROVIDER_SITE_OTHER): Payer: BLUE CROSS/BLUE SHIELD | Admitting: Internal Medicine

## 2016-02-20 VITALS — BP 131/80 | HR 100 | Temp 99.3°F | Ht 62.0 in | Wt 190.4 lb

## 2016-02-20 DIAGNOSIS — K648 Other hemorrhoids: Secondary | ICD-10-CM | POA: Diagnosis not present

## 2016-02-20 DIAGNOSIS — F251 Schizoaffective disorder, depressive type: Secondary | ICD-10-CM

## 2016-02-20 NOTE — Progress Notes (Signed)
[place] banding procedure note:  The patient presents with symptomatic hemorrhoids - status post pinning of the left lateral and right posterior hemorrhoid column. Last procedure in January of this year. Has had some continued itching for which miconazole cream has been very effective. States bleeding has diminished but is not resolved. Was having diarrhea with Benefiber 2 tablespoons twice daily cut back to 1 this helped. She asked for additional banding today., All risks, benefits, and alternative forms of therapy were described and informed consent was obtained.  In the left lateral decubitus position, a DRE demonstrated no abnormalities. .  The decision was made to band the right anterior internal hemorrhoid; the [place] was used to perform band ligation without complication. Digital anorectal examination was then performed to assure proper positioning of the band; then found to be in excellent position. I elected to go ahead and put a second band in the neutral position in similar fashion. There was no pinching or pain with this maneuver. Follow-up DRE revealed the band was on the left wall. No pinching or pain. Both bands felt to be in satisfactory position.The patient was discharged home without pain or other issues. Dietary and behavioral recommendations were given.  he patient will return in 2 months for followup and possible additional banding as required.  No complications were encountered and the patient tolerated the procedure well.

## 2016-02-20 NOTE — Patient Instructions (Signed)
Discussed orally 

## 2016-02-20 NOTE — Progress Notes (Signed)
       THERAPIST PROGRESS NOTE   Session Time:     Tuesday 02/20/2016 9:20 AM - 9:55 AM        Participation Level: Active  Behavioral Response: CasualAlert/euthymic  Type of Therapy: Individual Therapy  Treatment Goals :    1.Learn implement effective communication skills with family and significant others   Treatment Goals Addressed: 1   Interventions: CBT and Supportive, psychoeducation  Summary: [name] [name] is a 55 y.o. female who presents with presents with a long-standing history of symptoms of anxiety and depression, a previous diagnosis of schizoaffective disorder, and reports a history of at least 3 psychiatric hospitalizations from 36 through 1990. Patient 's symptoms have included depressed mood, tearfulness, anxiety, insomnia, guilt, and excessive worrying.   Patient last was seen about 3- 4 weeks ago.  She reports stable mood but continued frustration with her aunt. She continues to struggle with setting and maintaining boundaries in the relationship due to inappropriate guilt. However, she was assertive with her aunt n expressing her opinions.   Suicidal/Homicidal:  No.   Therapist Response: Reviewed symptoms, facilitated expression of feelings, assisted patient identify thought patterns inhibiting effective assertion in relationship with aunt, assisted patient reframe thought patterns to promote effective assertion, assisted patient identify distracting activities to cope with worry and thoughts evoking inappropriate guilt  Plan: Return again in 3 weeks.  Diagnosis: Axis I: Schizoaffective Disorder    Axis II: No diagnosis     Dilon Lank, LCSW 02/20/2016

## 2016-02-20 NOTE — Patient Instructions (Signed)
Avoid straining.  Benefiber 1 tablespoon daily daily  Limit toilet time to 2-5 minutes  Call with any interim problems  Schedule followup appointment in 2 months from now

## 2016-02-26 ENCOUNTER — Telehealth: Payer: Self-pay | Admitting: Internal Medicine

## 2016-02-26 NOTE — Telephone Encounter (Signed)
Tried to call pt- was told that she had already left for the day.

## 2016-02-27 NOTE — Telephone Encounter (Signed)
Tried to call home number- [phone]

## 2016-02-28 NOTE — Telephone Encounter (Signed)
Agree. No further recommendations.

## 2016-02-28 NOTE — Telephone Encounter (Signed)
I spoke with the pt, she said she has noticed a slight pain in her "bottom" when she wakes up in the mornings since she had the banding last week. She said it feels like something is pulling, but then it goes away. No bleeding and the pain is not severe. Sometimes she will take ibuprofen, but usually she doesn't notice it the rest of the day. She is not taking the fiber like she is supposed to. I have informed her that the band should have come off by now and the area is probably healing, and she really needs to take the fiber as RMR recommended. She said she would start taking it and is going to wait a couple more days and if it doesn't get better she will call back and let us know.  Routing to LSL in RMR absence.

## 2016-02-28 NOTE — Telephone Encounter (Incomplete)
I spoke with the pt, she said she has noticed a slight pain in her "bottom" when she wakes up in the mornings. She said it feels like something is pulling, but then it goes away. Sometimes she will take ibuprofen, but she doesn't notice it the rest of the day. She is not taking the fiber like she is supposed to. I have informed her that the band should have come off by now and the area is probably healing, and she really needs to take the fiber as RMR recommended. She said she would start taking it and is going to [employer]

## 2016-03-11 ENCOUNTER — Ambulatory Visit (HOSPITAL_COMMUNITY)
Admission: RE | Admit: 2016-03-11 | Discharge: 2016-03-11 | Disposition: A | Discharge status: Home / Self Care | Payer: BLUE CROSS/BLUE SHIELD | Source: Ambulatory Visit | Attending: Family Medicine | Admitting: Family Medicine

## 2016-03-11 DIAGNOSIS — Z1231 Encounter for screening mammogram for malignant neoplasm of breast: Secondary | ICD-10-CM

## 2016-03-19 ENCOUNTER — Ambulatory Visit (HOSPITAL_COMMUNITY): Payer: Self-pay | Admitting: Psychiatry

## 2016-04-02 ENCOUNTER — Ambulatory Visit (INDEPENDENT_AMBULATORY_CARE_PROVIDER_SITE_OTHER): Payer: BLUE CROSS/BLUE SHIELD | Admitting: Psychiatry

## 2016-04-02 ENCOUNTER — Encounter (HOSPITAL_COMMUNITY): Payer: Self-pay | Admitting: Psychiatry

## 2016-04-02 DIAGNOSIS — F251 Schizoaffective disorder, depressive type: Secondary | ICD-10-CM

## 2016-04-02 NOTE — Progress Notes (Signed)
       THERAPIST PROGRESS NOTE   Session Time:     Tuesday 04/02/2016 9:05 AM -9:53 AM          Participation Level: Active  Behavioral Response: CasualAlert/euthymic  Type of Therapy: Individual Therapy  Treatment Goals :    1.Learn implement effective communication skills with family and significant others   Treatment Goals Addressed: 1   Interventions: CBT and Supportive, psychoeducation  Summary: [name] [name] is a 55 y.o. female who presents with presents with a long-standing history of symptoms of anxiety and depression, a previous diagnosis of schizoaffective disorder, and reports a history of at least 3 psychiatric hospitalizations from 83 through 1990. Patient 's symptoms have included depressed mood, tearfulness, anxiety, insomnia, guilt, and excessive worrying.   Patient last was seen about 5-6weeks ago.  She reports continued stable mood. She denies depression and any suicidal/homicidal ideations since last session. She admits sometimes becoming a little down and becoming tearful when she has thoughts about what another church member wants said to her about her salvation. However, patient reports this does not last long as she is able to manage it with self talk. She reports some worry about financial issues regarding paying for car insurance. She has continued to try to improve her assertiveness skills and is pleased with her recent efforts in setting boundaries with her aunt. However, she expresses little confidence in being able to maintain boundaries with her aunt. Patient continues to see psychiatrist Dr. Nila Nephew at [facility] and has an appointment tomorrow per patient's report. Patient reports being medication compliant and says the antidepressant is helpful.   Suicidal/Homicidal:  No.   Therapist Response: Reviewed symptoms, facilitated expression of feelings, assisted patient identify thought patterns inhibiting effective assertion in relationship with aunt, assisted  patient identify statements to promote effective assertion and relationship with aunt, did role play and rehearsal regarding conversation with aunt, discussed progress, reviewed treatment plan.  Plan: Return again in 3 weeks.  Diagnosis: Axis I: Schizoaffective Disorder    Axis II: No diagnosis     [name] Devoss, LCSW 04/02/2016

## 2016-04-22 ENCOUNTER — Ambulatory Visit: Payer: Self-pay | Admitting: Gastroenterology

## 2016-04-23 ENCOUNTER — Encounter: Payer: Self-pay | Admitting: Gastroenterology

## 2016-04-23 ENCOUNTER — Ambulatory Visit (INDEPENDENT_AMBULATORY_CARE_PROVIDER_SITE_OTHER): Payer: BLUE CROSS/BLUE SHIELD | Admitting: Gastroenterology

## 2016-04-23 VITALS — BP 112/68 | HR 84 | Temp 98.3°F | Ht 62.0 in | Wt 189.0 lb

## 2016-04-23 DIAGNOSIS — K649 Unspecified hemorrhoids: Secondary | ICD-10-CM | POA: Diagnosis not present

## 2016-04-23 NOTE — Assessment & Plan Note (Signed)
Overall significant improvement in her hemorrhoidal disease status post [place] banding 3. She is pleased with her progress. Continues to have intermittent fecal soilage when she urinates. Discussed if her symptoms are significant, we can continue to adjust her fiber with goal of solid but soft bowel movement. Return to the office as needed.

## 2016-04-23 NOTE — Progress Notes (Signed)
      Primary Care Physician: Robert Bellow, MD  Primary Gastroenterologist:  Garfield Cornea, MD   Chief Complaint  Patient presents with  . Follow-up    HPI: [name] [name] is a 55 y.o. female hereFor follow-up. She has a history of symptomatic hemorrhoids and has underwent [facility] banding of the left lateral, right posterior, right anterior hemorrhoids. Last banding in July. Patient is doing well. Itching has improved. Bleeding has essentially resolved. Continues to take Benefiber. Regular bowel movements. Has to wipe off from every time she urinates because she finds some fecal soilage. This does not happen all the time. No fecal incontinence. Denies abdominal pain. No heartburn, dysphagia. She is up-to-date on colonoscopy.  Current Outpatient Prescriptions  Medication Sig Dispense Refill  . calcium citrate-vitamin D (CITRACAL+D) 315-200 MG-UNIT per tablet Take 2 tablets by mouth daily.     . divalproex (DEPAKOTE) 500 MG DR tablet Take 500 mg by mouth 2 (two) times daily.     [name] FLUoxetine (PROZAC) 40 MG capsule Take 40 mg by mouth daily.     . hydrochlorothiazide (HYDRODIURIL) 25 MG tablet Take 25 mg by mouth daily.    [name] ibuprofen (ADVIL,MOTRIN) 200 MG tablet Take 400 mg by mouth 2 (two) times daily as needed for headache.    . imipramine (TOFRANIL) 50 MG tablet Take 50 mg by mouth at bedtime.     [name] lisinopril (PRINIVIL,ZESTRIL) 5 MG tablet Take 5 mg by mouth daily.    . miconazole (MICOTIN) 2 % cream Apply 1 application topically as needed.    . Multiple Vitamin (MULTIVITAMIN) tablet Take 1 tablet by mouth daily.    . Wheat Dextrin (BENEFIBER DRINK MIX PO) Take by mouth.    . ziprasidone (GEODON) 80 MG capsule Take 80 mg by mouth 2 (two) times daily with a meal.      No current facility-administered medications for this visit.     Allergies as of 04/23/2016 - Review Complete 04/23/2016  Allergen Reaction Noted  . Penicillins Rash 04/18/2012    ROS:  General: Negative for  anorexia, weight loss, fever, chills, fatigue, weakness. ENT: Negative for hoarseness, difficulty swallowing , nasal congestion. CV: Negative for chest pain, angina, palpitations, dyspnea on exertion, peripheral edema.  Respiratory: Negative for dyspnea at rest, dyspnea on exertion, cough, sputum, wheezing.  GI: See history of present illness. GU:  Negative for dysuria, hematuria, urinary incontinence, urinary frequency, nocturnal urination.  Endo: Negative for unusual weight change.    Physical Examination:   BP 112/68   Pulse 84   Temp 98.3 F (36.8 C) (Oral)   Ht 5\' 2"  (1.575 m)   Wt 189 lb (85.7 kg)   LMP 03/18/2012   BMI 34.57 kg/m   General: Well-nourished, well-developed in no acute distress.  Eyes: No icterus. Mouth: Oropharyngeal mucosa moist and pink , no lesions erythema or exudate. Lungs: Clear to auscultation bilaterally.  Heart: Regular rate and rhythm, no murmurs rubs or gallops.  Abdomen: Bowel sounds are normal, nontender, nondistended, no hepatosplenomegaly or masses, no abdominal bruits or hernia , no rebound or guarding.   Extremities: No lower extremity edema. No clubbing or deformities. Neuro: Alert and oriented x 4   Skin: Warm and dry, no jaundice.   Psych: Alert and cooperative, normal mood and affect.

## 2016-04-23 NOTE — Progress Notes (Signed)
cc'ed to pcp °

## 2016-04-23 NOTE — Patient Instructions (Signed)
1. Office visit as needed.

## 2016-04-29 ENCOUNTER — Ambulatory Visit (HOSPITAL_COMMUNITY): Payer: Self-pay | Admitting: Psychiatry

## 2016-05-02 ENCOUNTER — Encounter (HOSPITAL_COMMUNITY): Payer: Self-pay | Admitting: Psychiatry

## 2016-05-02 ENCOUNTER — Ambulatory Visit (INDEPENDENT_AMBULATORY_CARE_PROVIDER_SITE_OTHER): Payer: BLUE CROSS/BLUE SHIELD | Admitting: Psychiatry

## 2016-05-02 DIAGNOSIS — F251 Schizoaffective disorder, depressive type: Secondary | ICD-10-CM

## 2016-05-02 NOTE — Progress Notes (Signed)
       THERAPIST PROGRESS NOTE   Session Time:     Thursday 05/02/2016 9:10 AM - 9:54 AM        Participation Level: Active  Behavioral Response: CasualAlert/euthymic  Type of Therapy: Individual Therapy  Treatment Goals :    1.Learn implement effective communication skills with family and significant others   Treatment Goals Addressed: 1   Interventions: CBT and Supportive, psychoeducation  Summary: [name] [name] is a 55 y.o. female who presents with presents with a long-standing history of symptoms of anxiety and depression, a previous diagnosis of schizoaffective disorder, and reports a history of at least 3 psychiatric hospitalizations from 28 through 1990. Patient 's symptoms have included depressed mood, tearfulness, anxiety, insomnia, guilt, and excessive worrying.   Patient last was seen about 4weeks ago.  She reports continued stable mood. She reports no  depression and no  suicidal/homicidal ideations since last session. She reports visit went well with psychiatrist Dr. Nila Nephew and says there were no changes in her medication. She did talk with Dr. Nila Nephew regarding sleep issues and has been working on ways to improve sleep hygiene per patient's report. She continues to express stress and frustration regarding interaction with her aunt when aunt makes comments to patient about patient's involvement with her ex-husband and cousin. Patient reports recent incident with aunt and admits being aggressive rather than assertive.   Suicidal/Homicidal:  No.   Therapist Response: Reviewed symptoms, facilitated expression of feelings, discussed recent incident with aunt, assisted patient identify ways to improve assertiveness skills in expressing  her feelings to aunt using "I" messages, did role play and rehearsal regarding conversation with aunt  Plan: Return again in 3 weeks. Agrees to implement strategies discussed in session  Diagnosis: Axis I: Schizoaffective Disorder    Axis  II: No diagnosis     Tyriek Hofman, LCSW 05/02/2016

## 2016-05-23 ENCOUNTER — Encounter (HOSPITAL_COMMUNITY): Payer: Self-pay | Admitting: Psychiatry

## 2016-05-23 ENCOUNTER — Ambulatory Visit (INDEPENDENT_AMBULATORY_CARE_PROVIDER_SITE_OTHER): Payer: BLUE CROSS/BLUE SHIELD | Admitting: Psychiatry

## 2016-05-23 DIAGNOSIS — F251 Schizoaffective disorder, depressive type: Secondary | ICD-10-CM | POA: Diagnosis not present

## 2016-05-23 NOTE — Progress Notes (Signed)
       THERAPIST PROGRESS NOTE   Session Time:     Thursday 05/23/2016 9:07 AM -  9:55 AM        Participation Level: Active  Behavioral Response: CasualAlert/euthymic  Type of Therapy: Individual Therapy  Treatment Goals :    1.Learn implement effective communication skills with family and significant others   Treatment Goals Addressed: 1   Interventions: CBT and Supportive, psychoeducation  Summary: [name] [name] is a 55 y.o. female who presents with presents with a long-standing history of symptoms of anxiety and depression, a previous diagnosis of schizoaffective disorder, and reports a history of at least 3 psychiatric hospitalizations from 46 through 1990. Patient 's symptoms have included depressed mood, tearfulness, anxiety, insomnia, guilt, and excessive worrying.   Patient last was seen about 3 weeks ago. She denies any symptoms of depression and reports continued stable mood. She reports no suicidal/homicidal ideations. She reports increased financial stress due to now having increased car insurance payments for her new car and unexpected vet expenses for treatment for her dog. She is pleased she has experienced some success in using assertiveness skills and saying no to cousin. She continues to struggle being assertive with her aunt. She reports she did not follow through with expressing feelings to aunt using I messages as she forgot what to say.    Suicidal/Homicidal:  No.   Therapist Response: Reviewed symptoms, facilitated expression of feelings, discussed patient's concerns regarding finances, explored patient's options regarding her budget, praised and reinforced patient's efforts in setting boundaries with cousin,assisted patient identify ways to improve assertiveness skills in expressing  her feelings to aunt using "I" messages, did role play and rehearsal regarding conversation with aunt, practiced ways to use assertiveness skills in saying no and suggesting  alternative plans in the interactions with people with whom she wants to maintain relationships  Plan: Return again in 3 weeks. Agrees to implement strategies discussed in session, read basic personal rights daily, and review handout provided in session, discussed possible termination at session as patient is trying to streamline budget and is pleased with her current progress in treatment.  Diagnosis: Axis I: Schizoaffective Disorder    Axis II: No diagnosis     BYNUM,PEGGY, LCSW 05/23/2016

## 2016-06-10 ENCOUNTER — Telehealth (HOSPITAL_COMMUNITY): Payer: Self-pay | Admitting: Psychiatry

## 2016-06-20 ENCOUNTER — Encounter (HOSPITAL_COMMUNITY): Payer: Self-pay | Admitting: Psychiatry

## 2016-06-20 ENCOUNTER — Ambulatory Visit (INDEPENDENT_AMBULATORY_CARE_PROVIDER_SITE_OTHER): Payer: BLUE CROSS/BLUE SHIELD | Admitting: Psychiatry

## 2016-06-20 DIAGNOSIS — F251 Schizoaffective disorder, depressive type: Secondary | ICD-10-CM

## 2016-06-20 NOTE — Progress Notes (Signed)
   THERAPIST PROGRESS NOTE   Session Time:     Thursday  06/20/2016 9:12 AM -  9:56 AM        Participation Level: Active  Behavioral Response: CasualAlert/depressed  Type of Therapy: Individual Therapy  Treatment Goals :    1.Learn implement effective communication skills with family and significant others   Treatment Goals Addressed: 1   Interventions: CBT and Supportive, psychoeducation  Summary: [name] [name] is a 55 y.o. female who presents with presents with a long-standing history of symptoms of anxiety and depression, a previous diagnosis of schizoaffective disorder, and reports a history of at least 3 psychiatric hospitalizations from 55 through 1990. Patient 's symptoms have included depressed mood, tearfulness, anxiety, insomnia, guilt, and excessive worrying.   Patient last was seen about 4 weeks ago. She reports increased stress, depressed mood, and anxiety since last session. She made a mistake on her job about 3 weeks ago and began to have negative spiraling thoughts along with fears of being fired although no one had given her any negative feedback or reprimands. She also reports reverting to old patterns of interaction with her ex-husband and her aunt. She expresses frustration as she has not used assertiveness skills and has been trying to meet others' requests while having little to no time for self.  Patient is scheduled to see psychiatrist Dr. Nila Nephew at [facility] on 07/02/2016.   Suicidal/Homicidal:  No.   Therapist Response: Reviewed symptoms, facilitated expression of feelings, assisted patient identify, challenge, and replace negative thought patterns regarding situation on job, reviewed relaxation techniques and  ways to improve self-care, reviewed ways to improve assertive communication set/maintain boundaries in her relationships,   Plan: Return again in 3 weeks. Agrees to implement strategies discussed in session, read basic personal rights daily,    Diagnosis: Axis I: Schizoaffective Disorder    Axis II: No diagnosis     Manjot Hinks, LCSW 06/20/2016

## 2016-07-03 ENCOUNTER — Telehealth (HOSPITAL_COMMUNITY): Payer: Self-pay | Admitting: Psychiatry

## 2016-07-03 ENCOUNTER — Telehealth (HOSPITAL_COMMUNITY): Payer: Self-pay | Admitting: *Deleted

## 2016-07-03 NOTE — Telephone Encounter (Signed)
Lonni Fix, NP from [facility] would like phone call from Vickii Chafe to discuss patient therapy plan.

## 2016-07-03 NOTE — Telephone Encounter (Signed)
Therapist returned call to NP Theodis Blaze at [facility] services to discuss treatment planning for patient. Plans are to transition patient to group treatment at [facility].

## 2016-07-11 ENCOUNTER — Encounter (HOSPITAL_COMMUNITY): Payer: Self-pay | Admitting: Psychiatry

## 2016-07-11 ENCOUNTER — Ambulatory Visit (INDEPENDENT_AMBULATORY_CARE_PROVIDER_SITE_OTHER): Payer: BLUE CROSS/BLUE SHIELD | Admitting: Psychiatry

## 2016-07-11 DIAGNOSIS — F251 Schizoaffective disorder, depressive type: Secondary | ICD-10-CM

## 2016-07-11 NOTE — Progress Notes (Signed)
   THERAPIST PROGRESS NOTE   Session Time:     Thursday  07/11/2016 9:14 AM - 9:50 AM         Participation Level: Active  Behavioral Response: CasualAlert/euthymic  Type of Therapy: Individual Therapy  Treatment Goals :    1.Learn implement effective communication skills with family and significant others   Treatment Goals Addressed: 1   Interventions: CBT and Supportive, psychoeducation  Summary: [name] [name] is a 55 y.o. female who presents with presents with a long-standing history of symptoms of anxiety and depression, a previous diagnosis of schizoaffective disorder, and reports a history of at least 3 psychiatric hospitalizations from 71 through 1990. Patient 's symptoms have included depressed mood, tearfulness, anxiety, insomnia, guilt, and excessive worrying.   Patient last was seen about 3 weeks ago. She reports feeling much better and denies any symptoms of depression since last session. She states trying not to think negatively on her job and says work has been going well. She reports less stress related to ex-husband and her cousin. However, she reports being tired and having little time for self as she is doing more errands and activities for her aunt.  She reports driving to church this past weekend but not going in due to her thoughts about the greeter going into the church when she arrived at [employer]. Patient admits negative thoughts with no evidence for her assumptions. Patient has been contacted by staff from [facility] and is scheduled to begin group therapy.    Suicidal/Homicidal:  No.   Therapist Response: Reviewed symptoms, facilitated expression of feelings, assisted patient identify, challenge, and replace negative thought patterns regarding recent situation at church, reviewed relaxation techniques and  ways to improve self-care, discussed therapist's conversation with Dr. Nila Nephew and  patient attending group at [facility], termination  Plan: Patient and therapist  agree to terminate services at this time as patient will start attending group therapy at [facility] and continue receiving medication management from Dr. Nila Nephew. Patient is encouraged to contact this practice should she need services from this practice in the future.   Diagnosis: Axis I: Schizoaffective Disorder    Axis II: No diagnosis      Outpatient Therapist Discharge Summary  [facility]    08-08-1960   Admission Date: 04/26/2013 Discharge Date:  07/11/2016 Reason for Discharge:  Patient and therapist agree to terminate services at this time as patient will start attending group therapy at [facility] and continue receiving medication management from Dr. Nila Nephew. Patient is encouraged to contact this practice should she need services from this practice in the future.  Diagnosis:  Axis I:  Schizoaffective Disorder      Comments:    Alonza Smoker LCSW     Miley Blanchett, LCSW 07/11/2016

## 2016-07-15 ENCOUNTER — Telehealth (HOSPITAL_COMMUNITY): Payer: Self-pay | Admitting: *Deleted

## 2016-07-15 NOTE — Telephone Encounter (Signed)
Pt called stating she would like to sch appt with Peggy. Per pt, she was going to go to [facility] because she was going to go to group but it did not work with her work sch. Pt would like to know if Vickii Chafe could still see her. Per pt, Peggy let her go and she's not sure if it shows it in chart or not. Pt number is home [phone].

## 2016-07-16 NOTE — Telephone Encounter (Signed)
Schedule one appointment

## 2016-07-17 NOTE — Telephone Encounter (Signed)
Pt scheduled appt with provider for Jan 9th. Per pt she can not come on Mondays due to having to work

## 2016-08-13 ENCOUNTER — Ambulatory Visit (HOSPITAL_COMMUNITY): Payer: Self-pay | Admitting: Psychiatry

## 2016-09-04 ENCOUNTER — Telehealth: Payer: Self-pay

## 2016-09-04 NOTE — Telephone Encounter (Signed)
Pt called- left a voicemail- she said she was having the same problem as before with her hemorrhoids. She is having the burning, itching and bleeding. She said this has been going on periodically since her last banding. She is taking her fiber and her bm's are normal, she is not straining. She has used the miconazole cream for the itching.  She said the hemorrhoids hurt when she has a bm.  Pt last seen in 04/2016 by LSL. Does she need an ov? Or can we send in something for her?

## 2016-09-05 MED ORDER — HYDROCORTISONE 2.5 % RE CREA: 1.0000 "application " | TOPICAL_CREAM | Freq: Two times a day (BID) | RECTAL | 0 refills | Status: DC

## 2016-09-05 NOTE — Telephone Encounter (Signed)
Pt is aware.  

## 2016-09-05 NOTE — Telephone Encounter (Signed)
RX for anusol cream sent. If ongoing issues, she will need OV with RMR only if she returns for symptomatic hemorrhoids so he can consider additional banding.

## 2017-01-29 ENCOUNTER — Ambulatory Visit (HOSPITAL_COMMUNITY)
Admission: RE | Admit: 2017-01-29 | Discharge: 2017-01-29 | Disposition: A | Discharge status: Home / Self Care | Payer: BLUE CROSS/BLUE SHIELD | Source: Ambulatory Visit | Attending: Family Medicine | Admitting: Family Medicine

## 2017-01-29 ENCOUNTER — Other Ambulatory Visit (HOSPITAL_COMMUNITY): Payer: Self-pay | Admitting: Family Medicine

## 2017-01-29 DIAGNOSIS — Z1231 Encounter for screening mammogram for malignant neoplasm of breast: Secondary | ICD-10-CM

## 2017-01-29 DIAGNOSIS — R1030 Lower abdominal pain, unspecified: Secondary | ICD-10-CM | POA: Diagnosis present

## 2017-01-31 ENCOUNTER — Other Ambulatory Visit: Payer: Self-pay | Admitting: Family Medicine

## 2017-01-31 ENCOUNTER — Other Ambulatory Visit (HOSPITAL_COMMUNITY): Payer: Self-pay | Admitting: Family Medicine

## 2017-01-31 DIAGNOSIS — R1030 Lower abdominal pain, unspecified: Secondary | ICD-10-CM

## 2017-02-06 ENCOUNTER — Ambulatory Visit (HOSPITAL_COMMUNITY): Payer: BLUE CROSS/BLUE SHIELD

## 2017-02-07 ENCOUNTER — Ambulatory Visit (HOSPITAL_COMMUNITY): Payer: BLUE CROSS/BLUE SHIELD

## 2017-02-07 ENCOUNTER — Encounter (HOSPITAL_COMMUNITY): Payer: Self-pay

## 2017-02-17 ENCOUNTER — Ambulatory Visit (HOSPITAL_COMMUNITY): Payer: Self-pay

## 2017-02-18 ENCOUNTER — Ambulatory Visit (HOSPITAL_COMMUNITY)
Admission: RE | Admit: 2017-02-18 | Discharge: 2017-02-18 | Disposition: A | Discharge status: Home / Self Care | Payer: BLUE CROSS/BLUE SHIELD | Source: Ambulatory Visit | Attending: Family Medicine | Admitting: Family Medicine

## 2017-02-18 DIAGNOSIS — R1030 Lower abdominal pain, unspecified: Secondary | ICD-10-CM | POA: Insufficient documentation

## 2017-03-17 ENCOUNTER — Ambulatory Visit (HOSPITAL_COMMUNITY)
Admission: RE | Admit: 2017-03-17 | Discharge: 2017-03-17 | Disposition: A | Discharge status: Home / Self Care | Payer: BLUE CROSS/BLUE SHIELD | Source: Ambulatory Visit | Attending: Family Medicine | Admitting: Family Medicine

## 2017-03-17 DIAGNOSIS — Z1231 Encounter for screening mammogram for malignant neoplasm of breast: Secondary | ICD-10-CM | POA: Diagnosis not present

## 2017-03-26 ENCOUNTER — Telehealth (HOSPITAL_COMMUNITY): Payer: Self-pay | Admitting: *Deleted

## 2017-03-26 NOTE — Telephone Encounter (Signed)
returned phone call, no answer, left voice message regarding an appointment.

## 2017-04-03 ENCOUNTER — Ambulatory Visit (INDEPENDENT_AMBULATORY_CARE_PROVIDER_SITE_OTHER): Payer: BLUE CROSS/BLUE SHIELD | Admitting: Psychiatry

## 2017-04-03 ENCOUNTER — Encounter (HOSPITAL_COMMUNITY): Payer: Self-pay | Admitting: Psychiatry

## 2017-04-03 DIAGNOSIS — F251 Schizoaffective disorder, depressive type: Secondary | ICD-10-CM
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
## 2017-04-22 ENCOUNTER — Encounter (HOSPITAL_COMMUNITY): Payer: Self-pay | Admitting: Psychiatry

## 2017-04-22 ENCOUNTER — Ambulatory Visit (INDEPENDENT_AMBULATORY_CARE_PROVIDER_SITE_OTHER): Payer: BLUE CROSS/BLUE SHIELD | Admitting: Psychiatry

## 2017-04-22 DIAGNOSIS — F251 Schizoaffective disorder, depressive type: Secondary | ICD-10-CM

## 2017-04-22 NOTE — Progress Notes (Signed)
      THERAPIST PROGRESS NOTE   Session Time:     Tuesday 04/22/2017 10:12 AM - 10:49 AM         Participation Level: Active  Behavioral Response: casual/drowsy/improved mood  Type of Therapy: Individual Therapy  Treatment Goals :    1.Learn implement effective communication skills with family and significant others   Treatment Goals Addressed: 1   Interventions: CBT and Supportive, psychoeducation  Summary: [name] [name] is a 56 y.o. female who presents with presents with a long-standing history of symptoms of anxiety and depression along with a previous diagnosis of schizoaffective disorder,and reports a history of at least 3 psychiatric hospitalizations from 89 through 1990. Patient 's symptoms have included psychotic depressed mood, tearfulness, anxiety, insomnia, guilt, and excessive worrying. She has received outpatient services from [facility] and [place] [facility].  She was seen in this practice from 04/26/2013 - 07/11/2016. She has continued to receive medication management from Dr. Nila Nephew at [facility] She reports she did not begin the group therapy at [facility] as she had intended upon discharge from this practice. Instead, she saw a counselor at [facility] for individual  therapy twice. Patient reports she could not afford the individual therapy sessions.   Patient last was seen 2 weeks ago. She reports feeling much better since last session and denies being depressed today. She says she still "gets a little down" when she does not do the things she thinks she is supposed to do. This includes not going to church as often as she thinks she should and being disobedient to God. She is less stressed about work but remains suspicious of her coworkers. She has talked with her VR counselor per her report and says he has been supportive and understanding. She remains medication compliant per her report but complains but increased dosage of Geodon causes drowsiness. She is scheduled to see  psychiatrist Dr. Nila Nephew at [facility] [place] this Thursday, 04/24/2017 and will discuss medication concerns at that time. Patient still is contemplating seeing psychiatrist in this practice. She will discuss this with Dr. Nila Nephew at next appointment after contacting her insurance company. Patient denies any suicidal ideations.    Suicidal/Homicidal:  No.   Therapist Response: Reviewed symptoms, praise and reinforced patient practicing controlled breathing, discussed effects, facilitated expression of thoughts and feelings, reviewed connection between stress and increased psychotic symptoms,  discussed the effects of patient's illness on her thought patterns, assisted patient identify, challenge, and replace negative thought patterns regarding recent events regarding her job,  advised patient to talk with Dr. Nila Nephew regarding medication management and possibly seeing another provider,        Plan:   Return in 2 weeks.  Diagnosis: Axis I: Schizoaffective Disorder    Axis II: No diagnosis        Lakashia Collison, LCSW 04/22/2017
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
## 2017-05-08 ENCOUNTER — Ambulatory Visit (INDEPENDENT_AMBULATORY_CARE_PROVIDER_SITE_OTHER): Payer: BLUE CROSS/BLUE SHIELD | Admitting: Psychiatry

## 2017-05-08 ENCOUNTER — Encounter (HOSPITAL_COMMUNITY): Payer: Self-pay | Admitting: Psychiatry

## 2017-05-08 VITALS — BP 115/71 | HR 73 | Ht 62.0 in | Wt 192.0 lb

## 2017-05-08 DIAGNOSIS — F419 Anxiety disorder, unspecified: Secondary | ICD-10-CM

## 2017-05-08 DIAGNOSIS — Z79899 Other long term (current) drug therapy: Secondary | ICD-10-CM

## 2017-05-08 DIAGNOSIS — I10 Essential (primary) hypertension: Secondary | ICD-10-CM

## 2017-05-08 DIAGNOSIS — F251 Schizoaffective disorder, depressive type: Secondary | ICD-10-CM | POA: Diagnosis not present

## 2017-05-08 DIAGNOSIS — Z91410 Personal history of adult physical and sexual abuse: Secondary | ICD-10-CM

## 2017-05-08 MED ORDER — ARIPIPRAZOLE 10 MG PO TABS: 10.0000 mg | ORAL_TABLET | Freq: Every day | ORAL | 0 refills | Status: DC

## 2017-05-08 MED ORDER — FLUOXETINE HCL 40 MG PO CAPS: 40.0000 mg | ORAL_CAPSULE | Freq: Every day | ORAL | 0 refills | Status: DC

## 2017-05-08 MED ORDER — DIVALPROEX SODIUM 500 MG PO DR TAB: 500.0000 mg | DELAYED_RELEASE_TABLET | Freq: Two times a day (BID) | ORAL | 0 refills | Status: DC

## 2017-05-08 NOTE — Patient Instructions (Signed)
1. Discontinue Geodon 2. Start Abilify 10 mg daily 3. Continue fluoxetine 40 mg daily 4. Continue depakote 500 mg twice a day  5. .Return to clinic in three weeks for 30 mins

## 2017-05-09 ENCOUNTER — Telehealth (HOSPITAL_COMMUNITY): Payer: Self-pay | Admitting: *Deleted

## 2017-05-09 ENCOUNTER — Ambulatory Visit (INDEPENDENT_AMBULATORY_CARE_PROVIDER_SITE_OTHER): Payer: BLUE CROSS/BLUE SHIELD | Admitting: Psychiatry

## 2017-05-09 ENCOUNTER — Encounter (HOSPITAL_COMMUNITY): Payer: Self-pay | Admitting: Psychiatry

## 2017-05-09 DIAGNOSIS — F251 Schizoaffective disorder, depressive type: Secondary | ICD-10-CM

## 2017-05-09 NOTE — Progress Notes (Signed)
      THERAPIST PROGRESS NOTE   Session Time:     Thursday 05/09/2017 8:25 AM -  8:49 AM         Participation Level: Active  Behavioral Response: casual/drowsy/improved mood  Type of Therapy: Individual Therapy  Treatment Goals :    1.Learn implement effective communication skills with family and significant others   Treatment Goals Addressed: 1   Interventions: CBT and Supportive, psychoeducation  Summary: [name] [name] is a 56 y.o. female who presents with presents with a long-standing history of symptoms of anxiety and depression along with a previous diagnosis of schizoaffective disorder,and reports a history of at least 3 psychiatric hospitalizations from 33 through 1990. Patient 's symptoms have included psychotic depressed mood, tearfulness, anxiety, insomnia, guilt, and excessive worrying. She has received outpatient services from [facility] and [place].  She was seen in this practice from 04/26/2013 - 07/11/2016. She has continued to receive medication management from Dr. Nila Nephew at [facility]. She reports she did not begin the group therapy at [facility] as she had intended upon discharge from this practice. Instead, she saw a counselor at [facility] for individual  therapy twice. Patient reports she could not afford the individual therapy sessions.   Patient last was seen 3 weeks ago. She reports feeling very drowsy and nervous today as she didn't sleep well last night. Per patient's report, she saw psychiatrist Dr. Modesta Messing yesterday and began a different medicatioin regimen. She addressed her concerns about possible effects of the medication with Dr. Modesta Messing today just prior to session with this clinician. She decided to discontinue seeing Dr. Nila Nephew at [facility] due to insurance issues.  Patient also reports feeling sick today and having difficulty driving to appointment due to this and being drowsy. She reports feeling unable to drive home. She reports increased stress and worry  due to  financial issues and reports difficulty paying her bills. She reports this is due partially to her tenant being  chronically late paying his rent. She reports very negative response from him when she recently discussed this issues with him.  She now is considering possible legal recourse.    Suicidal/Homicidal:  No.   Therapist Response: Reviewed symptoms, facilitated expression of thoughts and feelings, assisted patient with problem solving in exploring her options regarding financial issues, also assisted patient identify people in her support system to contact to take her home today. Patient agrees to contact her aunts  for assistance.       Plan:   Return in 2 weeks.  Diagnosis: Axis I: Schizoaffective Disorder    Axis II: No diagnosis        Breianna Delfino, LCSW 05/09/2017

## 2017-05-09 NOTE — Telephone Encounter (Signed)
patient is here this morning to see [name] [name], she said she did not sleep good last night, she is nervous, sweating, cold chills, nausea.   she said when she came this morning she was all over the road.   She said her medication was changed yesterday.

## 2017-05-09 NOTE — Telephone Encounter (Signed)
Noted. Discussed with patient. She feels drunk in the morning and has diaphoresis, insomnia, anxiety.   Will restart geodon 40 mg BID. She is instructed to take abilify 10 mg at night. She will contact the clinic if she continues to feel the same on weekend.

## 2017-05-12 NOTE — Telephone Encounter (Signed)
noted 

## 2017-05-23 ENCOUNTER — Telehealth (HOSPITAL_COMMUNITY): Payer: Self-pay | Admitting: *Deleted

## 2017-05-23 NOTE — Telephone Encounter (Signed)
Pt called yesterday stated she have no tabs or refills for her Geodon and needs more refills. Staff informed pt that on 05-08-2017, provider instructed her to stop the Geodon and start the Abilify. Per pt she stopped the Geodon last night. Per pt she just started the [place] today 05-23-2017 although provider instructed her to stop them back in 05-08-2017. Per pt, she also took her Abilify last night as well last night. Per pt she do not feel like she needs to go to the ER or Urgent care. Per pt she she is at work and don't have the money to go to the ER and there's not Urgent Care close but either. Per pt she do not feel suicidal to self. Per pt the last time she ate was this morning 05-23-2017 about 6:37am and right now it is 12:31 pm. Staff informed pt that due to her calling after office is closed, provider is not in the office but message will be sent to provider for her to see it on Monday. Staff then suggested to pt while she waits for office to call her back, if she her symptoms gets worse to see if someone at her work could drive her to the ER or Urgent care. Staff also suggested that if pt symptoms does not get better over the weekend to please go to the ER or Urgent care. Message will be sent to provider for further advise. [phone]

## 2017-05-26 ENCOUNTER — Other Ambulatory Visit (HOSPITAL_COMMUNITY): Payer: Self-pay | Admitting: Psychiatry

## 2017-05-26 MED ORDER — ZIPRASIDONE HCL 40 MG PO CAPS: 40.0000 mg | ORAL_CAPSULE | Freq: Two times a day (BID) | ORAL | 0 refills | Status: DC

## 2017-05-26 NOTE — Telephone Encounter (Signed)
She is doing appropriately- we discussed to change the plan as written in telephone encounter. She will continue abilify 10 mg daily and geodon 40 mg twice a day. Ordered geodon for 30 days.
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
## 2017-05-26 NOTE — Telephone Encounter (Signed)
noted 

## 2017-05-27 ENCOUNTER — Encounter (HOSPITAL_COMMUNITY): Payer: Self-pay | Admitting: Psychiatry

## 2017-05-27 ENCOUNTER — Ambulatory Visit (INDEPENDENT_AMBULATORY_CARE_PROVIDER_SITE_OTHER): Payer: BLUE CROSS/BLUE SHIELD | Admitting: Psychiatry

## 2017-05-27 DIAGNOSIS — F251 Schizoaffective disorder, depressive type: Secondary | ICD-10-CM | POA: Diagnosis not present
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
## 2017-05-29 ENCOUNTER — Ambulatory Visit (INDEPENDENT_AMBULATORY_CARE_PROVIDER_SITE_OTHER): Payer: BLUE CROSS/BLUE SHIELD | Admitting: Psychiatry

## 2017-05-29 ENCOUNTER — Encounter (HOSPITAL_COMMUNITY): Payer: Self-pay | Admitting: Psychiatry

## 2017-05-29 VITALS — BP 117/48 | HR 76 | Ht 62.0 in | Wt 191.0 lb

## 2017-05-29 DIAGNOSIS — F251 Schizoaffective disorder, depressive type: Secondary | ICD-10-CM | POA: Diagnosis not present

## 2017-05-29 MED ORDER — DIVALPROEX SODIUM 500 MG PO DR TAB: 500.0000 mg | DELAYED_RELEASE_TABLET | Freq: Two times a day (BID) | ORAL | 0 refills | Status: DC

## 2017-05-29 MED ORDER — ARIPIPRAZOLE 10 MG PO TABS: 15.0000 mg | ORAL_TABLET | Freq: Every day | ORAL | 0 refills | Status: DC

## 2017-05-29 MED ORDER — FLUOXETINE HCL 40 MG PO CAPS: 40.0000 mg | ORAL_CAPSULE | Freq: Every day | ORAL | 0 refills | Status: DC

## 2017-05-29 MED ORDER — ZIPRASIDONE HCL 20 MG PO CAPS: 20.0000 mg | ORAL_CAPSULE | Freq: Two times a day (BID) | ORAL | 0 refills | Status: DC

## 2017-05-29 NOTE — Patient Instructions (Addendum)
1. Decrease Ziprasidone 20 mg twice a day with meals 2. Increase Abilify 15 mg daily  3. Continue fluoxetine 40 mg daily  4. Continue depakote 500 mg twice a day  5. Continue benadryl 25 mg at night as needed for sleep 5. Obtain labs (VPA, CMP, CBC)  6. Return to clinic in one month for 30 mins - Try pill box if possible

## 2017-05-30 ENCOUNTER — Other Ambulatory Visit (HOSPITAL_COMMUNITY): Payer: Self-pay | Admitting: Psychiatry

## 2017-05-30 ENCOUNTER — Telehealth (HOSPITAL_COMMUNITY): Payer: Self-pay | Admitting: *Deleted

## 2017-05-30 MED ORDER — ARIPIPRAZOLE 15 MG PO TABS: 15.0000 mg | ORAL_TABLET | Freq: Every day | ORAL | 0 refills | Status: DC

## 2017-05-30 NOTE — Telephone Encounter (Signed)
Called pt to inform her with new information due to previous phone call. Staff unable to reach pt and lmtcb and office number provided on voicemail.

## 2017-05-30 NOTE — Telephone Encounter (Signed)
Noted. See if insurance company will cover for abilify 15 mg tab daily. I ordered one to the pharmacy.

## 2017-05-30 NOTE — Telephone Encounter (Signed)
Pt called stating she tried to get her refills for her Abilify 10 mg 1.5 tabs QD but her insurance would not approve it. Per pt to inform provider that she's going to just take one tab instead. Message will be sent to provider to see hat she recommend.

## 2017-05-31 LAB — CBC WITH DIFFERENTIAL/PLATELET
BASOS PCT: 1.1 %
Basophils Absolute: 57 cells/uL (ref 0–200)
EOS ABS: 120 {cells}/uL (ref 15–500)
Eosinophils Relative: 2.3 %
HEMATOCRIT: 38.2 % (ref 35.0–45.0)
Hemoglobin: 13.3 g/dL (ref 11.7–15.5)
LYMPHS ABS: 1934 {cells}/uL (ref 850–3900)
MCH: 31.1 pg (ref 27.0–33.0)
MCHC: 34.8 g/dL (ref 32.0–36.0)
MCV: 89.3 fL (ref 80.0–100.0)
MPV: 9.4 fL (ref 7.5–12.5)
Monocytes Relative: 9.9 %
NEUTROS PCT: 49.5 %
Neutro Abs: 2574 cells/uL (ref 1500–7800)
PLATELETS: 257 10*3/uL (ref 140–400)
RBC: 4.28 10*6/uL (ref 3.80–5.10)
RDW: 12.9 % (ref 11.0–15.0)
TOTAL LYMPHOCYTE: 37.2 %
WBC: 5.2 10*3/uL (ref 3.8–10.8)
WBCMIX: 515 {cells}/uL (ref 200–950)

## 2017-05-31 LAB — VALPROIC ACID LEVEL: Valproic Acid Lvl: 55.3 mg/L (ref 50.0–100.0)

## 2017-05-31 LAB — TSH: TSH: 4.68 mIU/L — ABNORMAL HIGH (ref 0.40–4.50)

## 2017-05-31 LAB — COMPREHENSIVE METABOLIC PANEL
AG Ratio: 1.4 (calc) (ref 1.0–2.5)
ALBUMIN MSPROF: 3.7 g/dL (ref 3.6–5.1)
ALT: 11 U/L (ref 6–29)
AST: 17 U/L (ref 10–35)
Alkaline phosphatase (APISO): 50 U/L (ref 33–130)
BUN: 14 mg/dL (ref 7–25)
CHLORIDE: 104 mmol/L (ref 98–110)
CO2: 29 mmol/L (ref 20–32)
CREATININE: 0.55 mg/dL (ref 0.50–1.05)
Calcium: 8.8 mg/dL (ref 8.6–10.4)
GLOBULIN: 2.7 g/dL (ref 1.9–3.7)
GLUCOSE: 90 mg/dL (ref 65–99)
POTASSIUM: 4.2 mmol/L (ref 3.5–5.3)
Sodium: 140 mmol/L (ref 135–146)
Total Bilirubin: 0.3 mg/dL (ref 0.2–1.2)
Total Protein: 6.4 g/dL (ref 6.1–8.1)

## 2017-06-02 ENCOUNTER — Encounter (HOSPITAL_COMMUNITY): Payer: Self-pay | Admitting: Psychiatry

## 2017-06-03 ENCOUNTER — Telehealth (HOSPITAL_COMMUNITY): Payer: Self-pay | Admitting: *Deleted

## 2017-06-03 ENCOUNTER — Other Ambulatory Visit (HOSPITAL_COMMUNITY): Payer: Self-pay | Admitting: Psychiatry

## 2017-06-03 MED ORDER — TRAZODONE HCL 50 MG PO TABS: ORAL_TABLET | ORAL | 0 refills | Status: DC

## 2017-06-03 NOTE — Telephone Encounter (Signed)
Pt called stating she can not sleep with her taking the Abilify. Per pt she tried taking over the counter sleeping med as well but she still can't sleep. Per pt she's upset because she can not sleep. Per pt to call her back at  [phone] work number.

## 2017-06-03 NOTE — Telephone Encounter (Signed)
I ordered Trazodone 25-50 mg at night as needed for sleep. See if it helps for insomnia. Advise her to contact the clinic if she still has issues with insomnia or any other side effect.

## 2017-06-04 NOTE — Telephone Encounter (Signed)
Called pt and informed her with information and she verbalized understanding.

## 2017-06-10 ENCOUNTER — Ambulatory Visit (HOSPITAL_COMMUNITY): Payer: BLUE CROSS/BLUE SHIELD | Admitting: Psychiatry

## 2017-06-10 ENCOUNTER — Encounter (HOSPITAL_COMMUNITY): Payer: Self-pay | Admitting: Psychiatry

## 2017-06-10 DIAGNOSIS — F251 Schizoaffective disorder, depressive type: Secondary | ICD-10-CM | POA: Diagnosis not present
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
## 2017-06-24 ENCOUNTER — Ambulatory Visit (HOSPITAL_COMMUNITY): Payer: BLUE CROSS/BLUE SHIELD | Admitting: Psychiatry

## 2017-06-24 DIAGNOSIS — F251 Schizoaffective disorder, depressive type: Secondary | ICD-10-CM | POA: Diagnosis not present
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
## 2017-06-30 ENCOUNTER — Encounter (HOSPITAL_COMMUNITY): Payer: Self-pay | Admitting: Psychiatry

## 2017-06-30 ENCOUNTER — Ambulatory Visit (HOSPITAL_COMMUNITY): Payer: BLUE CROSS/BLUE SHIELD | Admitting: Psychiatry

## 2017-06-30 VITALS — BP 110/68 | HR 93 | Ht 62.0 in | Wt 186.0 lb

## 2017-06-30 DIAGNOSIS — I10 Essential (primary) hypertension: Secondary | ICD-10-CM | POA: Diagnosis not present

## 2017-06-30 DIAGNOSIS — F251 Schizoaffective disorder, depressive type: Secondary | ICD-10-CM | POA: Diagnosis not present

## 2017-06-30 DIAGNOSIS — F419 Anxiety disorder, unspecified: Secondary | ICD-10-CM | POA: Diagnosis not present

## 2017-06-30 DIAGNOSIS — R450 Nervousness: Secondary | ICD-10-CM

## 2017-06-30 MED ORDER — FLUOXETINE HCL 40 MG PO CAPS: 40.0000 mg | ORAL_CAPSULE | Freq: Every day | ORAL | 1 refills | Status: DC

## 2017-06-30 MED ORDER — TRAZODONE HCL 50 MG PO TABS: 25.0000 mg | ORAL_TABLET | Freq: Every evening | ORAL | 0 refills | Status: DC | PRN

## 2017-06-30 MED ORDER — ZIPRASIDONE HCL 20 MG PO CAPS: 20.0000 mg | ORAL_CAPSULE | Freq: Two times a day (BID) | ORAL | 1 refills | Status: DC

## 2017-06-30 MED ORDER — DIVALPROEX SODIUM 500 MG PO DR TAB: 500.0000 mg | DELAYED_RELEASE_TABLET | Freq: Two times a day (BID) | ORAL | 1 refills | Status: DC

## 2017-06-30 NOTE — Patient Instructions (Signed)
1. Continue Ziprasidone 20 mg twice a day with meals 2. Discontinue abilify 3. Continue fluoxetine 40 mg daily  4. Continue Depakote 500 mg twice a day  5. Discontinue hydroxyzine 6. Return to clinic in two months for 30 mins 7. Obtain record from your primacy care doctor

## 2017-07-08 ENCOUNTER — Ambulatory Visit (HOSPITAL_COMMUNITY): Payer: Self-pay | Admitting: Psychiatry

## 2017-07-11 ENCOUNTER — Ambulatory Visit (HOSPITAL_COMMUNITY): Payer: BLUE CROSS/BLUE SHIELD | Admitting: Psychiatry

## 2017-07-11 ENCOUNTER — Encounter (HOSPITAL_COMMUNITY): Payer: Self-pay | Admitting: Psychiatry

## 2017-07-11 DIAGNOSIS — F251 Schizoaffective disorder, depressive type: Secondary | ICD-10-CM

## 2017-07-11 DIAGNOSIS — F259 Schizoaffective disorder, unspecified: Secondary | ICD-10-CM

## 2017-07-11 NOTE — Progress Notes (Signed)
      THERAPIST PROGRESS NOTE   Session Time:     Friday 07/11/2017 9:15 AM - 9:57 AM         Participation Level: Active  Behavioral Response: casual/alert/appropriate  Type of Therapy: Individual Therapy    Treatment Goals :    1.Learn implement effective communication skills with family and significant others   Treatment Goals Addressed: 1   Intervention : CBT and Supportive, psychoeducation  Summary: [name] [name] is a 56 y.o. female who presents with presents with a long-standing history of symptoms of anxiety and depression along with a previous diagnosis of schizoaffective disorder,and reports a history of at least 3 psychiatric hospitalizations from 63 through 1990. Patient 's symptoms  have included psychotic depressed mood, tearfulness, anxiety, insomnia, guilt, and excessive worrying. She has received outpatient services from [facility] and [facility].  She was seen in this practice from 04/26/2013 - 07/11/2016. She has continued to receive medication management from Dr. Nila Nephew at [facility]. She reports she did not begin the group therapy at [facility] as she had intended upon discharge from this practice. Instead, she saw a counselor at [facility] for individu       al  therapy twice. Patient reports she could not afford the individual therapy sessions.   Patient last was seen 2 -3 weeks ago. She reports continuing to feel  better since last session. She is having more time for self as she has been assertive with her aunt and her boyfriend. Patient reports improved efforts in setting and maintaining boundaries regarding assisting them with their errands. She has increased her efforts in pursuing activities and interest she enjoys. She recently attended two social events. and reports enjoying events. However, she made negative assumptions with no evidence regarding certain people failing to speak to her. She continues to do well at work. She expresses some concern about her weight and  states desire to lose weight.    Suicidal/Homicidal:  No.   Therapist Response: Reviewed symptoms, facilitated expression of thoughts and feelings, praised and reinforced patient's use of assertiveness skills, discussed effects on mood and behavior, continued to assigned patient to continue read basic personal rights between sessions, discussed patient's thought patterns about attending two recent events, assisted patient identify, challenge, and replace negative thoughts with healthy realistic alternative, assisted patient identify ways to increase physical activity to improve weight management.   Plan:   Return in 2 weeks.  Diagnosis: Axis I: Schizoaffective Disorder    Axis II: No diagnosis        Navi Erber, LCSW 07/11/2017

## 2017-08-08 ENCOUNTER — Ambulatory Visit (HOSPITAL_COMMUNITY): Payer: BLUE CROSS/BLUE SHIELD | Admitting: Psychiatry

## 2017-08-11 ENCOUNTER — Telehealth (HOSPITAL_COMMUNITY): Payer: Self-pay | Admitting: Psychiatry

## 2017-08-11 NOTE — Telephone Encounter (Signed)
Reviewed a record from [facility], evaluated last on 06/2017. TSH rechecked- 5.53.
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
## 2017-08-29 ENCOUNTER — Ambulatory Visit (HOSPITAL_COMMUNITY): Payer: BLUE CROSS/BLUE SHIELD | Admitting: Psychiatry

## 2017-08-29 ENCOUNTER — Encounter (HOSPITAL_COMMUNITY): Payer: Self-pay | Admitting: Psychiatry

## 2017-08-29 VITALS — BP 107/72 | HR 69 | Ht 62.0 in | Wt 186.0 lb

## 2017-08-29 DIAGNOSIS — R45850 Homicidal ideations: Secondary | ICD-10-CM | POA: Diagnosis not present

## 2017-08-29 DIAGNOSIS — F251 Schizoaffective disorder, depressive type: Secondary | ICD-10-CM | POA: Diagnosis not present

## 2017-08-29 MED ORDER — FLUOXETINE HCL 40 MG PO CAPS: 40.0000 mg | ORAL_CAPSULE | Freq: Every day | ORAL | 0 refills | Status: DC

## 2017-08-29 MED ORDER — DIVALPROEX SODIUM 500 MG PO DR TAB: 500.0000 mg | DELAYED_RELEASE_TABLET | Freq: Two times a day (BID) | ORAL | 0 refills | Status: DC

## 2017-08-29 MED ORDER — ZIPRASIDONE HCL 20 MG PO CAPS: 20.0000 mg | ORAL_CAPSULE | Freq: Two times a day (BID) | ORAL | 0 refills | Status: DC

## 2017-08-29 NOTE — Patient Instructions (Addendum)
1.Continue Ziprasidone 20 mg twice a day with meals 2. Continue fluoxetine 40 mg daily  3. ContinueDepakote500 mg twice a day  4. Try melatonin 3 mg, 2 hours before going to bed 5. Take trazodone 25-50 mg at night as needed for sleep 6. Return to clinic in three months for 30 mins

## 2017-09-01 ENCOUNTER — Encounter (HOSPITAL_COMMUNITY): Payer: Self-pay | Admitting: Psychiatry

## 2017-09-01 ENCOUNTER — Ambulatory Visit (HOSPITAL_COMMUNITY): Payer: BLUE CROSS/BLUE SHIELD | Admitting: Psychiatry

## 2017-09-01 DIAGNOSIS — F251 Schizoaffective disorder, depressive type: Secondary | ICD-10-CM

## 2017-09-01 NOTE — Progress Notes (Signed)
      THERAPIST PROGRESS NOTE   Session Time:    Monday 09/01/2017 3:12 PM- 4:00 PM        Participation Level: Active  Behavioral Response: casual/alert/appropriate  Type of Therapy: Individual Therapy    Treatment Goals :    1.Learn implement effective communication skills with family and significant others   Treatment Goals Addressed: 1   Intervention : CBT and Supportive, psychoeducation  Summary: [name] [name] is a 57 y.o. female who presents with presents with a long-standing history of symptoms of anxiety and depression along with a previous diagnosis of schizoaffective disorder,and reports a history of at least 3 psychiatric hospitalizations from 7 through 1990. Patient 's symptoms  have included psychotic depressed mood, tearfulness, anxiety, insomnia, guilt, and excessive worrying. She has received outpatient services from [facility] and [facility].  She was seen in this practice from 04/26/2013 - 07/11/2016. She has continued to receive medication management from Dr. Nila Nephew at [facility]. She reports she did not begin the group therapy at [facility] as she had intended upon discharge from this practice. Instead, she saw a counselor at [facility] for individu       al  therapy twice. Patient reports she could not afford the individual therapy sessions.   Patient last was seen about 7 weeks ago. She reports continuing to feel better since last session especially since discontinuing trazadone and taking melatonin as instructed by psychiatrist Dr. Modesta Messing per patient's report. She states better sleep pattern and having more energy. She has improved assertiveness skills regarding interaction with her aunt and reports recent incident. She continues to struggle regarding being assertively consistently. She expresses frustration with other relatives and her ex-husband regarding boundary issues.  She reports continued efforts to set boundaries and have more time for self. She continues to have  excessive guilt regarding not attending church.   Suicidal/Homicidal:  No.   Therapist Response: Reviewed symptoms, facilitated expression of thoughts and feelings, praised and reinforced patient's use of assertiveness skills, discussed effects on mood and behavior, assisted patient  practice use of assertiveness skills to set/ maintain boundaries with her cousin,  assigned patient to continue read basic personal rights daily   Plan:   Return in 4-5 weeks.  Diagnosis: Axis I: Schizoaffective Disorder    Axis II: No diagnosis        Antwain Caliendo, LCSW 09/01/2017

## 2017-09-18 ENCOUNTER — Telehealth (HOSPITAL_COMMUNITY): Payer: Self-pay | Admitting: Psychiatry
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
## 2017-10-06 ENCOUNTER — Ambulatory Visit (HOSPITAL_COMMUNITY): Payer: Self-pay | Admitting: Psychiatry

## 2017-10-07 ENCOUNTER — Ambulatory Visit (HOSPITAL_COMMUNITY): Payer: Self-pay | Admitting: Psychiatry

## 2017-10-14 ENCOUNTER — Encounter (HOSPITAL_COMMUNITY): Payer: Self-pay | Admitting: Psychiatry

## 2017-10-14 ENCOUNTER — Ambulatory Visit (INDEPENDENT_AMBULATORY_CARE_PROVIDER_SITE_OTHER): Payer: BLUE CROSS/BLUE SHIELD | Admitting: Psychiatry

## 2017-10-14 DIAGNOSIS — F251 Schizoaffective disorder, depressive type: Secondary | ICD-10-CM | POA: Diagnosis not present
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
## 2017-11-14 ENCOUNTER — Ambulatory Visit (HOSPITAL_COMMUNITY): Payer: BLUE CROSS/BLUE SHIELD | Admitting: Psychiatry

## 2017-11-14 ENCOUNTER — Encounter (HOSPITAL_COMMUNITY): Payer: Self-pay | Admitting: Psychiatry

## 2017-11-14 DIAGNOSIS — F251 Schizoaffective disorder, depressive type: Secondary | ICD-10-CM

## 2017-11-14 NOTE — Progress Notes (Signed)
      THERAPIST PROGRESS NOTE        Session Time:   Friday 11/14/2017 9:14 AM - 10:10 AM         Participation Level: Active  Behavioral Response: casual/alert/appropriate  Type of Therapy: Individual Therapy    Treatment Goals :    1.Learn implement effective communication skills with family and significant others   Treatment Goals Addressed: 1   Intervention : CBT and Supportive, psychoeducation  Summary: [name] [name] is a 57 y.o. female who presents with presents with a long-standing history of symptoms of anxiety and depression along with a previous diagnosis of schizoaffective disorder,and reports a history of at least 3 psychiatric hospitalizations from 48 through 1990. Patient 's symptoms  have included psychotic depressed mood, tearfulness, anxiety, insomnia, guilt, and excessive worrying. She has received outpatient services from [facility] and [facility].  She was seen in this practice from 04/26/2013 - 07/11/2016. She has continued to receive medication management from Dr. Nila Nephew at [facility]. She reports she did not begin the group therapy at [facility] as she had intended upon discharge from this practice. Instead, she saw a counselor at [facility] for individual therapy twice. Patient reports she could not afford the individual therapy sessions.   Patient last was seen about 4 weeks ago. She reports increased stress, depressed mood and irritability since last session. She complains she has no time for self as her relatives are constantly asking her to do things for them. She expresses frustration, anger, and resentment as she states having to do something for someone every day of the week. She also makes critical/judgmental statements about self and expresses guilt as she has not been attending church. She continues to struggle with use of assertiveness skills.  Patient denies hallucinations, suicidal ideations and homicidal ideations.    Suicidal/Homicidal:  No.   Therapist  Response: Reviewed symptoms, facilitated expression of thoughts and feelings, validated feelings, assisted patient identify the way she experiences stress in her body, assisted patient identify triggers and early signs of anger, reviewed rationale for and practiced deep breathing, assigned patient to practice  5-10 minutes 2 x per day, assisted patient develop schedule to have time for self, assisted patient practice ways to communicate assertively with aunt and boyfriend about designating specific times she is available and willing to help them with transportation, discussed having sessions more frequently  Plan:   Return in 3 weeks.  Diagnosis: Axis I: Schizoaffective Disorder    Axis II: No diagnosis        Kaeleb Emond, LCSW 11/14/2017
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
## 2017-11-27 ENCOUNTER — Ambulatory Visit (HOSPITAL_COMMUNITY): Payer: BLUE CROSS/BLUE SHIELD | Admitting: Psychiatry

## 2017-11-28 ENCOUNTER — Ambulatory Visit (HOSPITAL_COMMUNITY): Payer: BLUE CROSS/BLUE SHIELD | Admitting: Psychiatry

## 2017-11-28 VITALS — BP 125/83 | HR 84 | Ht 62.0 in | Wt 188.0 lb

## 2017-11-28 DIAGNOSIS — F251 Schizoaffective disorder, depressive type: Secondary | ICD-10-CM

## 2017-11-28 DIAGNOSIS — F419 Anxiety disorder, unspecified: Secondary | ICD-10-CM | POA: Diagnosis not present

## 2017-11-28 DIAGNOSIS — R45850 Homicidal ideations: Secondary | ICD-10-CM | POA: Diagnosis not present

## 2017-11-28 DIAGNOSIS — R45851 Suicidal ideations: Secondary | ICD-10-CM | POA: Diagnosis not present

## 2017-11-28 DIAGNOSIS — Z658 Other specified problems related to psychosocial circumstances: Secondary | ICD-10-CM

## 2017-11-28 DIAGNOSIS — R450 Nervousness: Secondary | ICD-10-CM

## 2017-11-28 MED ORDER — ZIPRASIDONE HCL 20 MG PO CAPS: 20.0000 mg | ORAL_CAPSULE | Freq: Two times a day (BID) | ORAL | 0 refills | Status: DC

## 2017-11-28 MED ORDER — DIVALPROEX SODIUM 500 MG PO DR TAB: 500.0000 mg | DELAYED_RELEASE_TABLET | Freq: Two times a day (BID) | ORAL | 0 refills | Status: DC

## 2017-11-28 MED ORDER — TRAZODONE HCL 50 MG PO TABS: 25.0000 mg | ORAL_TABLET | Freq: Every evening | ORAL | 0 refills | Status: DC | PRN

## 2017-11-28 MED ORDER — FLUOXETINE HCL 40 MG PO CAPS: 40.0000 mg | ORAL_CAPSULE | Freq: Every day | ORAL | 0 refills | Status: DC
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
## 2017-11-28 NOTE — Patient Instructions (Addendum)
1. ContinueZiprasidone 20 mg twice a day with meals 2. Continue fluoxetine 40 mg daily 3. ContinueDepakote500 mg twice a day 4. Try melatonin 3 mg, 2 hours before going to bed 5. Take trazodone 25-50 mg at night as needed for sleep 6. Return to clinic in three months for 30 mins 7. Obtain blood test before taking morning depakote

## 2017-12-03 LAB — COMPREHENSIVE METABOLIC PANEL
AG Ratio: 1.6 (calc) (ref 1.0–2.5)
ALKALINE PHOSPHATASE (APISO): 56 U/L (ref 33–130)
ALT: 10 U/L (ref 6–29)
AST: 19 U/L (ref 10–35)
Albumin: 4.2 g/dL (ref 3.6–5.1)
BILIRUBIN TOTAL: 0.4 mg/dL (ref 0.2–1.2)
BUN: 14 mg/dL (ref 7–25)
CHLORIDE: 104 mmol/L (ref 98–110)
CO2: 30 mmol/L (ref 20–32)
CREATININE: 0.57 mg/dL (ref 0.50–1.05)
Calcium: 8.9 mg/dL (ref 8.6–10.4)
GLOBULIN: 2.6 g/dL (ref 1.9–3.7)
Glucose, Bld: 87 mg/dL (ref 65–99)
Potassium: 4.1 mmol/L (ref 3.5–5.3)
SODIUM: 141 mmol/L (ref 135–146)
TOTAL PROTEIN: 6.8 g/dL (ref 6.1–8.1)

## 2017-12-03 LAB — T4, FREE: FREE T4: 0.9 ng/dL (ref 0.8–1.8)

## 2017-12-04 LAB — VALPROIC ACID LEVEL: VALPROIC ACID LVL: 63.7 mg/L (ref 50.0–100.0)

## 2017-12-08 ENCOUNTER — Telehealth (HOSPITAL_COMMUNITY): Payer: Self-pay | Admitting: Psychiatry

## 2017-12-08 NOTE — Telephone Encounter (Signed)
Called today and last week for update about her labs (and potential uptitration of depakote). She is not available and has no voice message.

## 2017-12-24 ENCOUNTER — Encounter (HOSPITAL_COMMUNITY): Payer: Self-pay | Admitting: Psychiatry

## 2017-12-24 ENCOUNTER — Ambulatory Visit (HOSPITAL_COMMUNITY): Payer: BLUE CROSS/BLUE SHIELD | Admitting: Psychiatry

## 2017-12-24 DIAGNOSIS — F251 Schizoaffective disorder, depressive type: Secondary | ICD-10-CM
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
## 2018-01-05 ENCOUNTER — Ambulatory Visit (HOSPITAL_COMMUNITY): Payer: Self-pay | Admitting: Psychiatry

## 2018-01-09 ENCOUNTER — Encounter (HOSPITAL_COMMUNITY): Payer: Self-pay | Admitting: Psychiatry

## 2018-01-09 ENCOUNTER — Ambulatory Visit (HOSPITAL_COMMUNITY): Payer: BLUE CROSS/BLUE SHIELD | Admitting: Psychiatry

## 2018-01-09 DIAGNOSIS — F251 Schizoaffective disorder, depressive type: Secondary | ICD-10-CM | POA: Diagnosis not present
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
## 2018-01-30 ENCOUNTER — Ambulatory Visit (HOSPITAL_COMMUNITY): Payer: BLUE CROSS/BLUE SHIELD | Admitting: Psychiatry

## 2018-01-30 DIAGNOSIS — F251 Schizoaffective disorder, depressive type: Secondary | ICD-10-CM

## 2018-01-30 NOTE — Progress Notes (Signed)
              THERAPIST PROGRESS NOTE        Session Time:   Friday 01/30/2018 9:15 AM -  10:00 AM                              Participation Level: Active  Behavioral Response: casual/alert/appropriate  Type of Therapy: Individual Therapy    Treatment Goals :    1.Learn implement effective communication skills with family and significant others   Treatment Goals Addressed: 1   Intervention : CBT and Supportive, psychoeducation  Summary: [name] [name] is a 57 y.o. female who presents with presents with a long-standing history of symptoms of anxiety and depression along with a previous diagnosis of schizoaffective disorder,and reports a history of at least 3 psychiatric hospitalizations from 21 through 1990. Patient 's symptoms  have included psychotic depressed mood, tearfulness, anxiety, insomnia, guilt, and excessive worrying. She has received outpatient services from [facility] and [place] [facility].  She was seen in this practice from 04/26/2013 - 07/11/2016. She has continued to receive medication management from Dr. Nila Nephew at [facility]. She reports she did not begin the group therapy at [facility] as she had intended upon discharge from this practice. Instead, she saw a counselor at [facility] for individual therapy twice. Patient reports she could not afford the individual therapy sessions.   Patient last was seen about 2 weeks ago. She reports decreased stress and improved mood since last session. She reports improved use of assertiveness skills in relationship with her family and cites several examples. She reports feeling guilty and says family member have gotten mad or upset at times.  She still reports not having much time for self and expresses desire to be able to have a day for herself. She is planning to use July 4th as day for self. She also reports still wanting to attend church but says she hasn't due to doing things for her family.   Suicidal/Homicidal:  No.   Therapist Response:   Praised and reinforced patient's use of assertiveness skills, discussed effects on mood and thoughts, praised patient's efforts for setting limits in some situations, assisted patient to begin to identify thoughts that inhibit effective assertion in other situations, reviewed connection between thought/mood/behavior using examples from patient's life, assisted patient identify and address processes and thoughts that may inhibit her plan to have day for self on July 4, assisted patient develop plan to attend church this Sunday, assisted patient identify and address processes that may inhibit plan to attend church this Sunday.                    Plan:   Return in 3 weeks.  Diagnosis: Axis I: Schizoaffective Disorder    Axis II: No diagnosis        Mervyn Pflaum, LCSW 01/30/2018

## 2018-02-10 ENCOUNTER — Other Ambulatory Visit (HOSPITAL_COMMUNITY): Payer: Self-pay | Admitting: Family Medicine

## 2018-02-10 DIAGNOSIS — Z1231 Encounter for screening mammogram for malignant neoplasm of breast: Secondary | ICD-10-CM

## 2018-02-20 ENCOUNTER — Ambulatory Visit (HOSPITAL_COMMUNITY): Payer: Self-pay | Admitting: Psychiatry

## 2018-02-20 ENCOUNTER — Ambulatory Visit (HOSPITAL_COMMUNITY): Payer: BLUE CROSS/BLUE SHIELD | Admitting: Psychiatry

## 2018-02-20 DIAGNOSIS — F251 Schizoaffective disorder, depressive type: Secondary | ICD-10-CM

## 2018-02-20 NOTE — Progress Notes (Signed)
              THERAPIST PROGRESS NOTE        Session Time:   Friday 02/20/2018 10:15 AM - 11:00 AM                               Participation Level: Active  Behavioral Response: casual/alert/appropriate  Type of Therapy: Individual Therapy    Treatment Goals :    1.Learn implement effective communication skills with family and significant others   Treatment Goals Addressed: 1   Intervention : CBT and Supportive, psychoeducation  Summary: [name] [name] is a 57 y.o. female who presents with presents with a long-standing history of symptoms of anxiety and depression along with a previous diagnosis of schizoaffective disorder,and reports a history of at least 3 psychiatric hospitalizations from 21 through 1990. Patient 's symptoms  have included psychotic depressed mood, tearfulness, anxiety, insomnia, guilt, and excessive worrying. She has received outpatient services from [facility] and [facility].  She was seen in this practice from 04/26/2013 - 07/11/2016. She has continued to receive medication management from Dr. Nila Nephew at [facility]. She reports she did not begin the group therapy at [facility] as she had intended upon discharge from this practice. Instead, she saw a counselor at [facility] for individual therapy twice. Patient reports she could not afford the individual therapy sessions.   Patient last was seen about 3  weeks ago. She reports she has attended church twice but went to the church her cousin wanted to attend rather than the church she wanted to attend. She also reports she did not have a day to self as she invited ex-husband and her cousin. She expresses continued frustration as she states she is still running around for others. She reports difficulty saying no.    Suicidal/Homicidal:  No.   Therapist Response:  Discussed stressors, facilitated expression of thoughts and feelings, validated feelings, assisted patient identify thoughts and processes that inhibited completion of  homework, Praised and reinforced patient's use of assertiveness skills, discussed effects on mood and thoughts, praised patient's efforts for setting limits in some situations, assisted patient identify thoughts that inhibit effective assertion and identify thoughts to promote effective assertion, assigned patient to read personal rights daily, used role play to assist patient improve assertiveness skills in conversation with cousin about going to her church this Sunday,                      Plan:   Return in 3 weeks.  Diagnosis: Axis I: Schizoaffective Disorder    Axis II: No diagnosis        Preslee Regas, LCSW 02/20/2018

## 2018-02-27 ENCOUNTER — Ambulatory Visit (HOSPITAL_COMMUNITY): Payer: BLUE CROSS/BLUE SHIELD | Admitting: Psychiatry

## 2018-02-27 ENCOUNTER — Encounter (HOSPITAL_COMMUNITY): Payer: Self-pay | Admitting: Psychiatry

## 2018-02-27 VITALS — BP 103/68 | HR 82 | Ht 62.0 in | Wt 190.0 lb

## 2018-02-27 DIAGNOSIS — F251 Schizoaffective disorder, depressive type: Secondary | ICD-10-CM | POA: Diagnosis not present

## 2018-02-27 MED ORDER — ZIPRASIDONE HCL 40 MG PO CAPS: 40.0000 mg | ORAL_CAPSULE | Freq: Two times a day (BID) | ORAL | 0 refills | Status: DC

## 2018-02-27 MED ORDER — DIVALPROEX SODIUM 500 MG PO DR TAB: 500.0000 mg | DELAYED_RELEASE_TABLET | Freq: Two times a day (BID) | ORAL | 0 refills | Status: DC

## 2018-02-27 MED ORDER — FLUOXETINE HCL 40 MG PO CAPS: 40.0000 mg | ORAL_CAPSULE | Freq: Every day | ORAL | 0 refills | Status: DC

## 2018-02-27 MED ORDER — TRAZODONE HCL 50 MG PO TABS: 25.0000 mg | ORAL_TABLET | Freq: Every evening | ORAL | 0 refills | Status: DC | PRN
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
## 2018-02-27 NOTE — Patient Instructions (Addendum)
1. Increase Ziprasidone 40 mg twice day with meals 2. Continue fluoxetine 40 mg daily 3. ContinueDepakote500 mg twice a day 4. Try melatonin 3 mg, 2 hours before going to bed 5. Take trazodone 25-50 mg at night as needed for sleep 6.Return to clinic in one monthfor 30 mins 7. Check EKG with your primary care for QTc prolongation (as Geodon dose is increased)

## 2018-03-06 ENCOUNTER — Encounter (HOSPITAL_COMMUNITY): Payer: Self-pay | Admitting: Psychiatry

## 2018-03-06 ENCOUNTER — Ambulatory Visit (HOSPITAL_COMMUNITY): Payer: BLUE CROSS/BLUE SHIELD | Admitting: Psychiatry

## 2018-03-06 DIAGNOSIS — F251 Schizoaffective disorder, depressive type: Secondary | ICD-10-CM

## 2018-03-06 NOTE — Progress Notes (Signed)
              THERAPIST PROGRESS NOTE        Session Time:   Friday 03/06/2018 9:15 AM - 9:58 AM                          Participation Level: Active  Behavioral Response: casual/alert/anxious/depressed  Type of Therapy: Individual Therapy    Treatment Goals :    1.Learn implement effective communication skills with family and significant others   Treatment Goals Addressed: 1   Intervention : CBT and Supportive, psychoeducation  Summary: [name] [name] is a 57 y.o. female who presents with presents with a long-standing history of symptoms of anxiety and depression along with a previous diagnosis of schizoaffective disorder,and reports a history of at least 3 psychiatric hospitalizations from 15 through 1990. Patient 's symptoms  have included psychotic depressed mood, tearfulness, anxiety, insomnia, guilt, and excessive worrying. She has received outpatient services from [facility] and [facility].  She was seen in this practice from 04/26/2013 - 07/11/2016. She has continued to receive medication management from Dr. Nila Nephew at [facility]. She reports she did not begin the group therapy at [facility] as she had intended upon discharge from this practice. Instead, she saw a counselor at [facility] for individual therapy twice. Patient reports she could not afford the individual therapy sessions.   Patient last was seen about 3  weeks ago. She reports increased stress due to financial worries related to paying taxes. 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 She also started having more worries and negative thoughts about interaction with others on her job. Patient reports this along with continued requests from family members makes her feel overwhelmed. She states not wanting to go to work or do anything. She reports thoughts of not wanting to live but denies suicidal ideations and states she doesn't want to hurt  self.    Suicidal/Homicidal:  No.   Therapist Response:  Discussed stressors, facilitated expression of thoughts and feelings, validated feelings, assisted patient with problem solving regarding financial resource to pay taxes including talking with administrators of her trust, explored patient's options regarding tenant, discussed rationale for and practiced deep breathing to help cope with stress, assigned patient to practice daily                    Plan:   Return in 3 weeks.  Diagnosis: Axis I: Schizoaffective Disorder    Axis II: No diagnosis        Essie Gehret, LCSW 03/06/2018

## 2018-03-23 ENCOUNTER — Ambulatory Visit (HOSPITAL_COMMUNITY)
Admission: RE | Admit: 2018-03-23 | Discharge: 2018-03-23 | Disposition: A | Discharge status: Home / Self Care | Payer: BLUE CROSS/BLUE SHIELD | Source: Ambulatory Visit | Attending: Family Medicine | Admitting: Family Medicine

## 2018-03-23 DIAGNOSIS — Z1231 Encounter for screening mammogram for malignant neoplasm of breast: Secondary | ICD-10-CM | POA: Diagnosis not present

## 2018-03-27 ENCOUNTER — Ambulatory Visit (HOSPITAL_COMMUNITY): Payer: BLUE CROSS/BLUE SHIELD | Admitting: Psychiatry

## 2018-03-27 DIAGNOSIS — F251 Schizoaffective disorder, depressive type: Secondary | ICD-10-CM | POA: Diagnosis not present

## 2018-03-27 NOTE — Progress Notes (Signed)
              THERAPIST PROGRESS NOTE        Session Time:   Friday 03/27/2018 9:07 AM  - 9:55 AM                        Participation Level: Active  Behavioral Response: casual/alert/anxious/  Type of Therapy: Individual Therapy    Treatment Goals :    1.Learn implement effective communication skills with family and significant others   Treatment Goals Addressed: 1   Intervention : CBT and Supportive, psychoeducation  Summary: [name] [name] is a 57 y.o. female who presents with presents with a long-standing history of symptoms of anxiety and depression along with a previous diagnosis of schizoaffective disorder,and reports a history of at least 3 psychiatric hospitalizations from 32 through 1990. Patient 's symptoms  have included psychotic depressed mood, tearfulness, anxiety, insomnia, guilt, and excessive worrying. She has received outpatient services from [facility] and [facility].  She was seen in this practice from 04/26/2013 - 07/11/2016. She has continued to receive medication management from Dr. Nila Nephew at [facility]. She reports she did not begin the group therapy at [facility] as she had intended upon discharge from this practice. Instead, she saw a counselor at [facility] for individual therapy twice. Patient reports she could not afford the individual therapy sessions.   Patient last was seen about 3  weeks ago. She reports continued stress due to financial worries. She expresses less worry about  paying taxes as administrators of trust have agreed to assist patient with this. However, she still expresses worry about other obligations and reports recently impulsively purchasing furniture resulting in another monthly bill for a year. She reports difficulty continuing to attend treatment due to financial stress. She also expresses frustration, resentment and anger regarding relatives continuing to make requests. She states being tired of this and now wanting to stick up for self.   Suicidal/Homicidal:  No.   Therapist Response:  Discussed stressors, facilitated expression of thoughts and feelings, validated feelings, provided psychoeducation on assertiveness and discussed differences between aggressive/passive/ and assertive communication, assisted patient identify her communication style, assisted patient identify and replace thoughts that inhibit effective assertion, discussed and practiced how to use "I messages", discussed and practiced tips to improve assertive communication, provided patient with handout to review, discussed patient's financial concerns and agreed to take a break from psychotherapy.                    Plan: Therapist and patient agree to discontinue psychotherapy for 2-3 months due to patient's concerns about finances. Patient will call sooner should symptoms worsen. She will continue to see psychiatrist Dr. Modesta Messing .  Diagnosis: Axis I: Schizoaffective Disorder    Axis II: No diagnosis        BYNUM,PEGGY, LCSW 03/27/2018

## 2018-03-31 ENCOUNTER — Other Ambulatory Visit (HOSPITAL_COMMUNITY): Payer: Self-pay | Admitting: Family Medicine

## 2018-03-31 DIAGNOSIS — R928 Other abnormal and inconclusive findings on diagnostic imaging of breast: Secondary | ICD-10-CM
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
## 2018-04-03 ENCOUNTER — Encounter (HOSPITAL_COMMUNITY): Payer: Self-pay | Admitting: Psychiatry

## 2018-04-03 ENCOUNTER — Ambulatory Visit (INDEPENDENT_AMBULATORY_CARE_PROVIDER_SITE_OTHER): Payer: BLUE CROSS/BLUE SHIELD | Admitting: Psychiatry

## 2018-04-03 VITALS — BP 122/81 | HR 83 | Ht 62.0 in | Wt 189.4 lb

## 2018-04-03 DIAGNOSIS — F419 Anxiety disorder, unspecified: Secondary | ICD-10-CM | POA: Diagnosis not present

## 2018-04-03 DIAGNOSIS — F251 Schizoaffective disorder, depressive type: Secondary | ICD-10-CM | POA: Diagnosis not present

## 2018-04-03 MED ORDER — ZIPRASIDONE HCL 40 MG PO CAPS: 40.0000 mg | ORAL_CAPSULE | Freq: Two times a day (BID) | ORAL | 0 refills | Status: DC

## 2018-04-03 NOTE — Patient Instructions (Signed)
1. Continue Ziprasidone 40 mg twice day with meals 2. Continue fluoxetine 40 mg daily 3. ContinueDepakote500 mg twice a day 4. Try melatonin 3 mg, 2 hours before going to bed 5. Take trazodone 25-50 mg at night as needed for sleep 6.Return to clinic in two months for 15 mins

## 2018-04-07 ENCOUNTER — Ambulatory Visit (HOSPITAL_COMMUNITY)
Admission: RE | Admit: 2018-04-07 | Discharge: 2018-04-07 | Disposition: A | Discharge status: Home / Self Care | Payer: BLUE CROSS/BLUE SHIELD | Source: Ambulatory Visit | Attending: Family Medicine | Admitting: Family Medicine

## 2018-04-07 DIAGNOSIS — R928 Other abnormal and inconclusive findings on diagnostic imaging of breast: Secondary | ICD-10-CM | POA: Diagnosis not present

## 2018-04-10 ENCOUNTER — Ambulatory Visit (HOSPITAL_COMMUNITY): Payer: Self-pay | Admitting: Psychiatry

## 2018-04-24 ENCOUNTER — Ambulatory Visit (HOSPITAL_COMMUNITY): Payer: Self-pay | Admitting: Psychiatry

## 2018-05-08 ENCOUNTER — Ambulatory Visit (HOSPITAL_COMMUNITY): Payer: Self-pay | Admitting: Psychiatry

## 2018-05-22 ENCOUNTER — Ambulatory Visit (HOSPITAL_COMMUNITY): Payer: Self-pay | Admitting: Psychiatry

## 2018-06-01 NOTE — Progress Notes (Signed)
[facility] MD/PA/NP OP Progress Note  06/03/2018 9:29 AM [name] [name]  MRN:  [id number]  Chief Complaint:  Chief Complaint    Other; Follow-up; Schizophrenia     HPI:  Patient presents tonight for follow-up appointment for schizoaffective disorder.  She states that she felt weak the other day when she was in the grocery store.  She asks if it is related to oxygen.  She is not aware of any pulmonary condition, and she agrees to see her primary care doctor if any worsening in her symptoms.  She states that her boss at work is leaving.  Although she misses him, she understands this transition.  She is still concerned about the man, who lives in the mobile home.  She feels frustrated that he does not pay on a regular basis.  However, she also feels ambivalent of eviction given she will not get any payment for a few months.  She feels worried about financial strain as there are bills for her license and taxes.  She sleeps 9 hours.  She denies fatigue.  She felt depressed and had a brief SI a few days ago due to financial strain.  She called her aunt, who is a Landscape architect" to the patient.  She denies AH, VH.  Wt Readings from Last 3 Encounters:  06/03/18 190 lb (86.2 kg)  04/03/18 189 lb 6.4 oz (85.9 kg)  02/27/18 190 lb (86.2 kg)    Visit Diagnosis:    ICD-10-CM   1. Schizoaffective disorder, depressive type ([place]) F25.1 CBC    Comprehensive Metabolic Panel (CMET)    Valproic Acid level    Past Psychiatric History: Please see initial evaluation for full details. I have reviewed the history. No updates at this time.     Past Medical History:  Past Medical History:  Diagnosis Date  . BV (bacterial vaginosis) 01/28/2013  . Essential hypertension, benign   . Hemorrhoids   . Obsessive-compulsive disorders   . Postmenopausal bleeding   . Schizoaffective disorder ([place])   . Tubular adenoma     Past Surgical History:  Procedure Laterality Date  . BUNIONECTOMY    . COLONOSCOPY N/A 07/05/2015   Dr.Rourk- internal hemorrhoids, redundant colon, colionic dierticulosis, colonic polyp. bx= tubular adenoma. next tcs 06/2020.   [name] HEMORRHOID BANDING  2017   Dr.Rourk    Family Psychiatric History: Please see initial evaluation for full details. I have reviewed the history. No updates at this time.     Family History:  Family History  Problem Relation Age of Onset  . Diabetes type II Maternal Grandmother   . Diabetes Maternal Grandmother   . Colon cancer Mother 71  . Hypertension Maternal Aunt     Social History:  Social History   Socioeconomic History  . Marital status: Divorced    Spouse name: Not on file  . Number of children: Not on file  . Years of education: Not on file  . Highest education level: Not on file  Occupational History  . Not on file  Social Needs  . Financial resource strain: Not on file  . Food insecurity:    Worry: Not on file    Inability: Not on file  . Transportation needs:    Medical: Not on file    Non-medical: Not on file  Tobacco Use  . Smoking status: Never Smoker  . Smokeless tobacco: Never Used  Substance and Sexual Activity  . Alcohol use: No  . Drug use: No  . Sexual activity: Not  Currently    Birth control/protection: Post-menopausal  Lifestyle  . Physical activity:    Days per week: Not on file    Minutes per session: Not on file  . Stress: Not on file  Relationships  . Social connections:    Talks on phone: Not on file    Gets together: Not on file    Attends religious service: Not on file    Active member of club or organization: Not on file    Attends meetings of clubs or organizations: Not on file    Relationship status: Not on file  Other Topics Concern  . Not on file  Social History Narrative  . Not on file    Allergies:  Allergies  Allergen Reactions  . Penicillins Rash    Metabolic Disorder Labs: No results found for: HGBA1C, MPG No results found for: PROLACTIN No results found for: CHOL, TRIG, HDL,  CHOLHDL, VLDL, LDLCALC Lab Results  Component Value Date   TSH 4.68 (H) 05/30/2017    Therapeutic Level Labs: No results found for: LITHIUM Lab Results  Component Value Date   VALPROATE 63.7 12/03/2017   VALPROATE 55.3 05/30/2017   No components found for:  CBMZ  Current Medications: Current Outpatient Medications  Medication Sig Dispense Refill  . calcium citrate-vitamin D (CITRACAL+D) 315-200 MG-UNIT per tablet Take 2 tablets by mouth daily.     . divalproex (DEPAKOTE) 500 MG DR tablet Take 1 tablet (500 mg total) by mouth 2 (two) times daily. 180 tablet 0  . FLUoxetine (PROZAC) 40 MG capsule Take 1 capsule (40 mg total) by mouth daily. 90 capsule 0  . hydrochlorothiazide (HYDRODIURIL) 25 MG tablet Take 25 mg by mouth daily.    . hydrocortisone (ANUSOL-HC) 2.5 % rectal cream Place 1 application rectally 2 (two) times daily. 30 g 0  . ibuprofen (ADVIL,MOTRIN) 200 MG tablet Take 400 mg by mouth 2 (two) times daily as needed for headache.    . lisinopril (PRINIVIL,ZESTRIL) 5 MG tablet Take 5 mg by mouth daily.    . miconazole (MICOTIN) 2 % cream Apply 1 application topically as needed.    . Multiple Vitamin (MULTIVITAMIN) tablet Take 1 tablet by mouth daily.    . traZODone (DESYREL) 50 MG tablet Take 0.5 tablets (25 mg total) by mouth at bedtime as needed for sleep. 90 tablet 0  . Wheat Dextrin (BENEFIBER DRINK MIX PO) Take by mouth.    . ziprasidone (GEODON) 40 MG capsule Take 1 capsule (40 mg total) by mouth 2 (two) times daily with a meal. 180 capsule 0   No current facility-administered medications for this visit.      Musculoskeletal: Strength & Muscle Tone: within normal limits Gait & Station: normal Patient leans: N/A  Psychiatric Specialty Exam: Review of Systems  Psychiatric/Behavioral: Positive for depression. Negative for hallucinations, memory loss, substance abuse and suicidal ideas. The patient is not nervous/anxious and does not have insomnia.   All other  systems reviewed and are negative.   Blood pressure 111/73, pulse 77, height 5\' 2"  (1.575 m), weight 190 lb (86.2 kg), last menstrual period 03/18/2012, SpO2 94 %.Body mass index is 34.75 kg/m.  General Appearance: Fairly Groomed  Eye Contact:  Good  Speech:  Clear and Coherent  Volume:  Normal  Mood:  "good"  Affect:  Appropriate, Congruent and Full Range  Thought Process:  Coherent, circumstantial at times  Orientation:  Full (Time, Place, and Person)  Thought Content: Logical   Suicidal Thoughts:  No  Homicidal Thoughts:  No  Memory:  Immediate;   Good  Judgement:  Good  Insight:  Present  Psychomotor Activity:  Normal  Concentration:  Concentration: Good and Attention Span: Good  Recall:  Good  Fund of Knowledge: Good  Language: Good  Akathisia:  No  Handed:  Right  AIMS (if indicated): not done  Assets:  Communication Skills Desire for Improvement  ADL's:  Intact  Cognition: WNL  Sleep:  Good   Screenings:   Assessment and Plan:  KENETHA COZZA is a 57 y.o. year old female with a history of schizoaffective disorder, hypertension, who presents for follow up appointment for Schizoaffective disorder, depressive type ([place]) - Plan: CBC, Comprehensive Metabolic Panel (CMET), Valproic Acid level  # Schizoaffective disorder # r/o schizophrenia There has been slight improvement in circumstantial thought process and loosening in association since the last visit.  Will continue ziprasidone to target schizoaffective disorder.  Will obtain fax from PCP to get EKG result.  Will continue to monitor EPS.  Will continue Depakote at the current dose for mood dysregulation.  Will continue fluoxetine to target depression.  Will continue trazodone as needed for insomnia.  Discussed behavioral activation.   Plan I have reviewed and updated plans as below 1. Continue Ziprasidone 40 mg twice day with meals 2.Continue fluoxetine 40 mg daily 3. ContinueDepakote500 mg twice a day 5.  Take trazodone 25-50 mg at night as needed for sleep 6.Return to clinic in three months for 15 mins 7. Will obtain fax; EKG  - obtain CMP, depakote, CBC  Past trials of medication:Tofranil, Depakote Geodon, Prozac,  The patient demonstrates the following risk factors for suicide: Chronic risk factors for suicide include: psychiatric disorder of schizoaffective disorder. 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 Protective factorsfor this patient include: hope for the future. Considering these factors, the overall suicide risk at this point appears to be low.Patient isappropriate for outpatient follow up.   [name], MD 06/03/2018, 9:29 AM

## 2018-06-03 ENCOUNTER — Ambulatory Visit (HOSPITAL_COMMUNITY): Payer: Self-pay | Admitting: Psychiatry

## 2018-06-03 ENCOUNTER — Ambulatory Visit (HOSPITAL_COMMUNITY): Payer: BLUE CROSS/BLUE SHIELD | Admitting: Psychiatry

## 2018-06-03 VITALS — BP 111/73 | HR 77 | Ht 62.0 in | Wt 190.0 lb

## 2018-06-03 DIAGNOSIS — F251 Schizoaffective disorder, depressive type: Secondary | ICD-10-CM

## 2018-06-03 MED ORDER — DIVALPROEX SODIUM 500 MG PO DR TAB: 500.0000 mg | DELAYED_RELEASE_TABLET | Freq: Two times a day (BID) | ORAL | 0 refills | Status: DC

## 2018-06-03 MED ORDER — ZIPRASIDONE HCL 40 MG PO CAPS: 40.0000 mg | ORAL_CAPSULE | Freq: Two times a day (BID) | ORAL | 0 refills | Status: DC

## 2018-06-03 MED ORDER — FLUOXETINE HCL 40 MG PO CAPS: 40.0000 mg | ORAL_CAPSULE | Freq: Every day | ORAL | 0 refills | Status: DC

## 2018-06-03 MED ORDER — TRAZODONE HCL 50 MG PO TABS: 25.0000 mg | ORAL_TABLET | Freq: Every evening | ORAL | 0 refills | Status: DC | PRN

## 2018-06-03 NOTE — Patient Instructions (Addendum)
1. Continue Ziprasidone 40 mg twice day with meals 2.Continue fluoxetine 40 mg daily 3. ContinueDepakote500 mg twice a day 5. Take trazodone 25-50 mg at night as needed for sleep 6.Return to clinic in three months for 15 mins

## 2018-06-04 ENCOUNTER — Telehealth (HOSPITAL_COMMUNITY): Payer: Self-pay | Admitting: Psychiatry

## 2018-06-04 ENCOUNTER — Encounter (HOSPITAL_COMMUNITY): Payer: Self-pay | Admitting: Psychiatry

## 2018-06-04 DIAGNOSIS — F251 Schizoaffective disorder, depressive type: Secondary | ICD-10-CM

## 2018-06-04 LAB — COMPREHENSIVE METABOLIC PANEL
AG Ratio: 1.7 (calc) (ref 1.0–2.5)
ALBUMIN MSPROF: 4 g/dL (ref 3.6–5.1)
ALKALINE PHOSPHATASE (APISO): 54 U/L (ref 33–130)
ALT: 11 U/L (ref 6–29)
AST: 18 U/L (ref 10–35)
BILIRUBIN TOTAL: 0.3 mg/dL (ref 0.2–1.2)
BUN: 12 mg/dL (ref 7–25)
CALCIUM: 9 mg/dL (ref 8.6–10.4)
CO2: 32 mmol/L (ref 20–32)
CREATININE: 0.66 mg/dL (ref 0.50–1.05)
Chloride: 101 mmol/L (ref 98–110)
Globulin: 2.4 g/dL (calc) (ref 1.9–3.7)
Glucose, Bld: 75 mg/dL (ref 65–139)
POTASSIUM: 4.3 mmol/L (ref 3.5–5.3)
Sodium: 139 mmol/L (ref 135–146)
TOTAL PROTEIN: 6.4 g/dL (ref 6.1–8.1)

## 2018-06-04 LAB — CBC
HCT: 40.5 % (ref 35.0–45.0)
Hemoglobin: 13.8 g/dL (ref 11.7–15.5)
MCH: 30.3 pg (ref 27.0–33.0)
MCHC: 34.1 g/dL (ref 32.0–36.0)
MCV: 89 fL (ref 80.0–100.0)
MPV: 10 fL (ref 7.5–12.5)
PLATELETS: 237 10*3/uL (ref 140–400)
RBC: 4.55 10*6/uL (ref 3.80–5.10)
RDW: 13.1 % (ref 11.0–15.0)
WBC: 7.3 10*3/uL (ref 3.8–10.8)

## 2018-06-04 LAB — VALPROIC ACID LEVEL: VALPROIC ACID LVL: 104.5 mg/L — AB (ref 50.0–100.0)

## 2018-06-04 NOTE — Telephone Encounter (Signed)
Patient informed of  Valporic Acid Level & that recommended to take another blood test BEFORE TAKING MORNING DOSE OF DEPAKOTE in one week

## 2018-06-04 NOTE — Telephone Encounter (Signed)
Please contact the patient. Valproic acid level was slightly high on yesterday's blood test. I would recommend she stays at the same dose of depakote at this time unless she experiences any side effect. Would recommend she take another blood test BEFORE TAKING MORNING DOSE OF DEPAKOTE in one week.

## 2018-06-04 NOTE — Telephone Encounter (Signed)
Will recheck VPA, CMP in one week (obtain blood test before morning dose)

## 2018-06-11 ENCOUNTER — Telehealth (HOSPITAL_COMMUNITY): Payer: Self-pay | Admitting: Psychiatry

## 2018-06-11 ENCOUNTER — Other Ambulatory Visit (HOSPITAL_COMMUNITY): Payer: Self-pay | Admitting: Psychiatry

## 2018-06-12 ENCOUNTER — Telehealth (HOSPITAL_COMMUNITY): Payer: Self-pay | Admitting: Psychiatry

## 2018-06-12 ENCOUNTER — Ambulatory Visit (HOSPITAL_COMMUNITY): Payer: BLUE CROSS/BLUE SHIELD | Admitting: Psychiatry

## 2018-06-12 LAB — COMPLETE METABOLIC PANEL WITH GFR
AG RATIO: 1.5 (calc) (ref 1.0–2.5)
ALBUMIN MSPROF: 4.1 g/dL (ref 3.6–5.1)
ALT: 16 U/L (ref 6–29)
AST: 21 U/L (ref 10–35)
Alkaline phosphatase (APISO): 57 U/L (ref 33–130)
BUN: 12 mg/dL (ref 7–25)
CALCIUM: 9.6 mg/dL (ref 8.6–10.4)
CO2: 31 mmol/L (ref 20–32)
CREATININE: 0.57 mg/dL (ref 0.50–1.05)
Chloride: 102 mmol/L (ref 98–110)
GFR, EST AFRICAN AMERICAN: 119 mL/min/{1.73_m2} (ref >=60)
GFR, Est Non African American: 103 mL/min/{1.73_m2} (ref >=60)
GLOBULIN: 2.7 g/dL (ref 1.9–3.7)
Glucose, Bld: 81 mg/dL (ref 65–139)
Potassium: 4 mmol/L (ref 3.5–5.3)
SODIUM: 140 mmol/L (ref 135–146)
Total Bilirubin: 0.3 mg/dL (ref 0.2–1.2)
Total Protein: 6.8 g/dL (ref 6.1–8.1)

## 2018-06-12 LAB — VALPROIC ACID LEVEL: VALPROIC ACID LVL: 60.7 mg/L (ref 50.0–100.0)

## 2018-06-12 NOTE — Telephone Encounter (Signed)
LVM 06/12/18 :  that the blood test result was within normal range. Recommend her to stay on current dose of medication/depakote.

## 2018-06-12 NOTE — Telephone Encounter (Signed)
Please inform the patient that the blood test result was within normal range. Recommend her to stay on current dose of medication/depakote.

## 2018-06-25 NOTE — Telephone Encounter (Signed)
Therapist returned call to patient and informed her it would be okay for ex-husband to attend last part of session if patient chooses for husband to attend.

## 2018-07-07 ENCOUNTER — Encounter (HOSPITAL_COMMUNITY): Payer: Self-pay | Admitting: Psychiatry

## 2018-07-07 ENCOUNTER — Ambulatory Visit (HOSPITAL_COMMUNITY): Payer: BLUE CROSS/BLUE SHIELD | Admitting: Psychiatry

## 2018-07-07 DIAGNOSIS — F251 Schizoaffective disorder, depressive type: Secondary | ICD-10-CM | POA: Diagnosis not present

## 2018-07-07 NOTE — Progress Notes (Signed)
              THERAPIST PROGRESS NOTE        Session Time:   Tuesday 07/07/2018 9:58 AM - 10:50 AM                         Participation Level: Active  Behavioral Response: casual/alert/anxious/thought process -tangential  Type of Therapy: Individual Therapy    Treatment Goals :    1.Learn implement effective communication skills with family and significant others   Treatment Goals Addressed: 1   Intervention : CBT and Supportive, psychoeducation  Summary: [name] [name] is a 57 y.o. female who presents with presents with a long-standing history of symptoms of anxiety and depression along with a previous diagnosis of schizoaffective disorder,and reports a history of at least 3 psychiatric hospitalizations from 81 through 1990. Patient 's symptoms  have included psychotic depressed mood, tearfulness, anxiety, insomnia, guilt, and excessive worrying. She has received outpatient services from [facility] and [facility].  She was seen in this practice from 04/26/2013 - 07/11/2016. She has continued to receive medication management from Dr. Nila Nephew at [facility]. She reports she did not begin the group therapy at [facility] as she had intended upon discharge from this practice. Instead, she saw a counselor at [facility] for individual therapy twice. Patient reports she could not afford the individual therapy sessions.   Patient last was seen in August 2019. She says she has been doing doing good and reports being happy she has been attending church regularly. She reports setting limits with her aunt but still having  difficulty being assertive and setting limits with her ex-husband, cousin and her tenant.  She reports ambivalent feelings about reconciling with ex-husband and has not made decision as to what she wants to do. She also reports continued worry about financial issues. Patient denies any SI/HI and hallucinations.  She reports no longer being able to attend psychotherapy due to financial stress  but agrees to continue seeing psychiatrist Dr. Modesta Messing.     Suicidal/Homicidal:  No.   Therapist Response:  Discussed stressors, facilitated expression of thoughts and feelings, validated feelings, praised and reinforced patient's efforts to try to improve assertiveness skills and set limits, discussed ways to improve consistency in using assertiveness skills, reviewed relaxation technique, discussed patient's financial concerns and agreed to terminate psychotherapy at this time. Patient is encouraged to contact this practice should she decided to pursue therapy in the future. She agrees to continue seeing psychiatrist Dr. Modesta Messing.                    Plan: Therapist and patient agree to discontinue psychotherapy  due to patient's concerns about finances. Patient is encouraged to contact this practice should she decide to pursure therapy in the future.  She will continue to see psychiatrist Dr. Modesta Messing .  Diagnosis: Axis I: Schizoaffective Disorder    Axis II: No diagnosis        Mercer Peifer, LCSW 07/07/2018    Outpatient Therapist Discharge Summary  [name]    1960-10-21   Admission Date: 04/03/2017 Discharge Date: 07/07/2018 Reason for Discharge:  Patient decided to discontinue treatment Diagnosis:  Axis I:  Schizoaffective Disorder     Comments:    Alonza Smoker LCSW

## 2018-08-31 NOTE — Progress Notes (Signed)
[facility] MD/PA/NP OP Progress Note  09/03/2018 9:20 AM [name] [name]  MRN:  [id number]  Chief Complaint:  Chief Complaint    Other; Follow-up     HPI:  Patient presents for follow-up appointment for schizoaffective disorder.  She talks about her issues with her cousin.  She reports that the man has started to pay mortgage, and she feels comfortable with.  When she is asked about work, she states that it is going good, and talks about the "children" who comes to the hallway. She works as a Retail buyer. She denies SI, and talks about the man with toothache.  She denies feeling depressed.  She has good energy.  She has good appetite.  She denies SI.  She denies anxiety.  She denies decreased need for sleep or euphoria.  She denies paranoia or ideas of reference.  She denies HI, AH, VH. She has not obtained EKG yet; she is reminded to do so at her next visit with PCP.   Visit Diagnosis:    ICD-10-CM   1. Schizoaffective disorder, unspecified type ([place]) F25.9     Past Psychiatric History: Please see initial evaluation for full details. I have reviewed the history. No updates at this time.     Past Medical History:  Past Medical History:  Diagnosis Date  . BV (bacterial vaginosis) 01/28/2013  . Essential hypertension, benign   . Hemorrhoids   . Obsessive-compulsive disorders   . Postmenopausal bleeding   . Schizoaffective disorder ([place])   . Tubular adenoma     Past Surgical History:  Procedure Laterality Date  . BUNIONECTOMY    . COLONOSCOPY N/A 07/05/2015   Dr.Rourk- internal hemorrhoids, redundant colon, colionic dierticulosis, colonic polyp. bx= tubular adenoma. next tcs 06/2020.   [name] HEMORRHOID BANDING  2017   Dr.Rourk    Family Psychiatric History: Please see initial evaluation for full details. I have reviewed the history. No updates at this time.     Family History:  Family History  Problem Relation Age of Onset  . Diabetes type II Maternal Grandmother   . Diabetes Maternal  Grandmother   . Colon cancer Mother 40  . Hypertension Maternal Aunt     Social History:  Social History   Socioeconomic History  . Marital status: Divorced    Spouse name: Not on file  . Number of children: Not on file  . Years of education: Not on file  . Highest education level: Not on file  Occupational History  . Not on file  Social Needs  . Financial resource strain: Not on file  . Food insecurity:    Worry: Not on file    Inability: Not on file  . Transportation needs:    Medical: Not on file    Non-medical: Not on file  Tobacco Use  . Smoking status: Never Smoker  . Smokeless tobacco: Never Used  Substance and Sexual Activity  . Alcohol use: No  . Drug use: No  . Sexual activity: Not Currently    Birth control/protection: Post-menopausal  Lifestyle  . Physical activity:    Days per week: Not on file    Minutes per session: Not on file  . Stress: Not on file  Relationships  . Social connections:    Talks on phone: Not on file    Gets together: Not on file    Attends religious service: Not on file    Active member of club or organization: Not on file    Attends meetings of clubs  or organizations: Not on file    Relationship status: Not on file  Other Topics Concern  . Not on file  Social History Narrative  . Not on file    Allergies:  Allergies  Allergen Reactions  . Penicillins Rash    Metabolic Disorder Labs: No results found for: HGBA1C, MPG No results found for: PROLACTIN No results found for: CHOL, TRIG, HDL, CHOLHDL, VLDL, LDLCALC Lab Results  Component Value Date   TSH 4.68 (H) 05/30/2017    Therapeutic Level Labs: No results found for: LITHIUM Lab Results  Component Value Date   VALPROATE 60.7 06/11/2018   VALPROATE 104.5 (H) 06/03/2018   No components found for:  CBMZ  Current Medications: Current Outpatient Medications  Medication Sig Dispense Refill  . calcium citrate-vitamin D (CITRACAL+D) 315-200 MG-UNIT per tablet Take  2 tablets by mouth daily.     . divalproex (DEPAKOTE) 500 MG DR tablet Take 1 tablet (500 mg total) by mouth 2 (two) times daily. 180 tablet 0  . FLUoxetine (PROZAC) 40 MG capsule Take 1 capsule (40 mg total) by mouth daily. 90 capsule 0  . hydrochlorothiazide (HYDRODIURIL) 25 MG tablet Take 25 mg by mouth daily.    . hydrocortisone (ANUSOL-HC) 2.5 % rectal cream Place 1 application rectally 2 (two) times daily. 30 g 0  . ibuprofen (ADVIL,MOTRIN) 200 MG tablet Take 400 mg by mouth 2 (two) times daily as needed for headache.    . lisinopril (PRINIVIL,ZESTRIL) 5 MG tablet Take 5 mg by mouth daily.    . miconazole (MICOTIN) 2 % cream Apply 1 application topically as needed.    . Multiple Vitamin (MULTIVITAMIN) tablet Take 1 tablet by mouth daily.    . traZODone (DESYREL) 50 MG tablet Take 0.5 tablets (25 mg total) by mouth at bedtime as needed for sleep. 90 tablet 0  . Wheat Dextrin (BENEFIBER DRINK MIX PO) Take by mouth.    . ziprasidone (GEODON) 40 MG capsule Take 1 capsule (40 mg total) by mouth 2 (two) times daily with a meal. 180 capsule 0   No current facility-administered medications for this visit.      Musculoskeletal: Strength & Muscle Tone: within normal limits Gait & Station: normal Patient leans: N/A  Psychiatric Specialty Exam: Review of Systems  Psychiatric/Behavioral: Negative for depression, hallucinations, memory loss, substance abuse and suicidal ideas. The patient is not nervous/anxious and does not have insomnia.   All other systems reviewed and are negative.   Blood pressure 111/74, pulse 78, height 5\' 2"  (1.575 m), weight 195 lb (88.5 kg), last menstrual period 03/18/2012, SpO2 93 %.Body mass index is 35.67 kg/m.  General Appearance: Fairly Groomed  Eye Contact:  Good  Speech:  Clear and Coherent  Volume:  Normal  Mood:  "good"  Affect:  Appropriate, Congruent and Full Range  Thought Process:  Coherent, tangential at times,   Orientation:  Full (Time, Place,  and Person)  Thought Content: Logical   Suicidal Thoughts:  No  Homicidal Thoughts:  No  Memory:  Immediate;   Good  Judgement:  Good  Insight:  Present  Psychomotor Activity:  Normal  Concentration:  Concentration: Good and Attention Span: Good  Recall:  Good  Fund of Knowledge: Good  Language: Good  Akathisia:  No  Handed:  Right  AIMS (if indicated): no tremors, no rigidity  Assets:  Communication Skills Desire for Improvement  ADL's:  Intact  Cognition: WNL  Sleep:  Good   Screenings:   Assessment and Plan:  [name] [name] is a 58 y.o. year old female with a history of schizoaffective disorder, hypertension , who presents for follow up appointment for Schizoaffective disorder, unspecified type [facility])  # Schizoaffective disorder Although patient continues to demonstrate circumstantial and tangential thought process, there is no significant change since the last visit.  Will continue ziprasidone to target schizoaffective disorder.  Discussed risk of EPS and metabolic side effect.  Will continue Depakote for mood dysregulation.  Will continue fluoxetine to target depression.  Will continue trazodone as needed for insomnia.  Discussed behavioral activation.   Plan 1.ContinueZiprasidone 40 mg twice day with meals 2.Continue fluoxetine 40 mg daily 3. ContinueDepakote500 mg twice a day (level checked in Nov 2019) 5. Take trazodone 25-50 mg at night as needed for sleep 6.Return to clinicin three months for 15 mins 7.Will obtain fax; EKG(will be seen by PCP in March)   Past trials of medication:Tofranil, Depakote Geodon, Prozac,  The patient demonstrates the following risk factors for suicide: Chronic risk factors for suicide include: psychiatric disorder of schizoaffective disorder. 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 Protective factorsfor this patient include: hope for the future. Considering these factors, the overall suicide risk  at this point appears to be low.Patient isappropriate for outpatient follow up.  [name], MD 09/03/2018, 9:20 AM

## 2018-09-03 ENCOUNTER — Ambulatory Visit (HOSPITAL_COMMUNITY): Payer: BLUE CROSS/BLUE SHIELD | Admitting: Psychiatry

## 2018-09-03 ENCOUNTER — Encounter (HOSPITAL_COMMUNITY): Payer: Self-pay | Admitting: Psychiatry

## 2018-09-03 VITALS — BP 111/74 | HR 78 | Ht 62.0 in | Wt 195.0 lb

## 2018-09-03 DIAGNOSIS — F259 Schizoaffective disorder, unspecified: Secondary | ICD-10-CM | POA: Diagnosis not present

## 2018-09-03 MED ORDER — TRAZODONE HCL 50 MG PO TABS: 25.0000 mg | ORAL_TABLET | Freq: Every evening | ORAL | 0 refills | Status: DC | PRN

## 2018-09-03 MED ORDER — ZIPRASIDONE HCL 40 MG PO CAPS: 40.0000 mg | ORAL_CAPSULE | Freq: Two times a day (BID) | ORAL | 0 refills | Status: DC

## 2018-09-03 MED ORDER — DIVALPROEX SODIUM 500 MG PO DR TAB: 500.0000 mg | DELAYED_RELEASE_TABLET | Freq: Two times a day (BID) | ORAL | 0 refills | Status: DC

## 2018-09-03 MED ORDER — FLUOXETINE HCL 40 MG PO CAPS: 40.0000 mg | ORAL_CAPSULE | Freq: Every day | ORAL | 0 refills | Status: DC

## 2018-09-03 NOTE — Patient Instructions (Addendum)
1.ContinueZiprasidone 40 mg twice day with meals 2.Continue fluoxetine 40 mg daily 3. ContinueDepakote500 mg twice a day 5. Take trazodone 25-50 mg at night as needed for sleep 6.Return to clinicin three months for 15 mins 7. Check EKG with your primary care doctor

## 2018-09-28 NOTE — Progress Notes (Signed)
I understand. Thanks. 

## 2018-11-26 NOTE — Progress Notes (Signed)
Virtual Visit via Telephone Note  I connected with [name] [name] on 12/03/18 at  9:00 AM EDT by telephone and verified that I am speaking with the correct person using two identifiers.   I discussed the limitations, risks, security and privacy concerns of performing an evaluation and management service by telephone and the availability of in person appointments. I also discussed with the patient that there may be a patient responsible charge related to this service. The patient expressed understanding and agreed to proceed.   I discussed the assessment and treatment plan with the patient. The patient was provided an opportunity to ask questions and all were answered. The patient agreed with the plan and demonstrated an understanding of the instructions.   The patient was advised to call back or seek an in-person evaluation if the symptoms worsen or if the condition fails to improve as anticipated.  I provided 15 minutes of non-face-to-face time during this encounter.   [name], MD   [facility] MD/PA/NP OP Progress Note  12/03/2018 9:20 AM [name] [name]  MRN:  [id number]  Chief Complaint:  Chief Complaint    Follow-up; Schizophrenia     HPI:  This is a follow-up visit for schizoaffective disorder.  She states that her mood is not too bad.  She has not worked for months as [employer] is close.  Although she was told by her employer that they will keep in touch, she feels like she is laid off. She denies any concerns at home.  She lives with her.  She agrees to try taking a walk regularly.  Although the man in her trailer does not pay the rent, she understands that due to visitation with COVID 19. She denies feeling depressed.  She feels occasionally anxious.  She has fair appetite and motivation.  She denies SI.  She denies panic attacks.  She denies AH, VH.  She denies ideas of reference. She denies paranoia. She takes medication regularly. She will see her PCP in a few weeks by phone.    Visit Diagnosis:    ICD-10-CM   1. Schizoaffective disorder, unspecified type ([place]) F25.9     Past Psychiatric History: Please see initial evaluation for full details. I have reviewed the history. No updates at this time.     Past Medical History:  Past Medical History:  Diagnosis Date  . BV (bacterial vaginosis) 01/28/2013  . Essential hypertension, benign   . Hemorrhoids   . Obsessive-compulsive disorders   . Postmenopausal bleeding   . Schizoaffective disorder ([place])   . Tubular adenoma     Past Surgical History:  Procedure Laterality Date  . BUNIONECTOMY    . COLONOSCOPY N/A 07/05/2015   Dr.Rourk- internal hemorrhoids, redundant colon, colionic dierticulosis, colonic polyp. bx= tubular adenoma. next tcs 06/2020.   [name] HEMORRHOID BANDING  2017   Dr.Rourk    Family Psychiatric History: Please see initial evaluation for full details. I have reviewed the history. No updates at this time.     Family History:  Family History  Problem Relation Age of Onset  . Diabetes type II Maternal Grandmother   . Diabetes Maternal Grandmother   . Colon cancer Mother 56  . Hypertension Maternal Aunt     Social History:  Social History   Socioeconomic History  . Marital status: Divorced    Spouse name: Not on file  . Number of children: Not on file  . Years of education: Not on file  . Highest education level: Not on  file  Occupational History  . Not on file  Social Needs  . Financial resource strain: Not on file  . Food insecurity:    Worry: Not on file    Inability: Not on file  . Transportation needs:    Medical: Not on file    Non-medical: Not on file  Tobacco Use  . Smoking status: Never Smoker  . Smokeless tobacco: Never Used  Substance and Sexual Activity  . Alcohol use: No  . Drug use: No  . Sexual activity: Not Currently    Birth control/protection: Post-menopausal  Lifestyle  . Physical activity:    Days per week: Not on file    Minutes per session: Not  on file  . Stress: Not on file  Relationships  . Social connections:    Talks on phone: Not on file    Gets together: Not on file    Attends religious service: Not on file    Active member of club or organization: Not on file    Attends meetings of clubs or organizations: Not on file    Relationship status: Not on file  Other Topics Concern  . Not on file  Social History Narrative  . Not on file    Allergies:  Allergies  Allergen Reactions  . Penicillins Rash    Metabolic Disorder Labs: No results found for: HGBA1C, MPG No results found for: PROLACTIN No results found for: CHOL, TRIG, HDL, CHOLHDL, VLDL, LDLCALC Lab Results  Component Value Date   TSH 4.68 (H) 05/30/2017    Therapeutic Level Labs: No results found for: LITHIUM Lab Results  Component Value Date   VALPROATE 60.7 06/11/2018   VALPROATE 104.5 (H) 06/03/2018   No components found for:  CBMZ  Current Medications: Current Outpatient Medications  Medication Sig Dispense Refill  . calcium citrate-vitamin D (CITRACAL+D) 315-200 MG-UNIT per tablet Take 2 tablets by mouth daily.     . divalproex (DEPAKOTE) 500 MG DR tablet Take 1 tablet (500 mg total) by mouth 2 (two) times daily. 180 tablet 0  . FLUoxetine (PROZAC) 40 MG capsule Take 1 capsule (40 mg total) by mouth daily. 90 capsule 0  . hydrochlorothiazide (HYDRODIURIL) 25 MG tablet Take 25 mg by mouth daily.    . hydrocortisone (ANUSOL-HC) 2.5 % rectal cream Place 1 application rectally 2 (two) times daily. 30 g 0  . ibuprofen (ADVIL,MOTRIN) 200 MG tablet Take 400 mg by mouth 2 (two) times daily as needed for headache.    . lisinopril (PRINIVIL,ZESTRIL) 5 MG tablet Take 5 mg by mouth daily.    . miconazole (MICOTIN) 2 % cream Apply 1 application topically as needed.    . Multiple Vitamin (MULTIVITAMIN) tablet Take 1 tablet by mouth daily.    . traZODone (DESYREL) 50 MG tablet Take 0.5 tablets (25 mg total) by mouth at bedtime as needed for sleep. 90 tablet  0  . Wheat Dextrin (BENEFIBER DRINK MIX PO) Take by mouth.    . ziprasidone (GEODON) 40 MG capsule Take 1 capsule (40 mg total) by mouth 2 (two) times daily with a meal. 180 capsule 0   No current facility-administered medications for this visit.      Musculoskeletal: Strength & Muscle Tone: N/A Gait & Station: N/A Patient leans: N/A  Psychiatric Specialty Exam: Review of Systems  Psychiatric/Behavioral: Negative for depression, hallucinations, memory loss, substance abuse and suicidal ideas. The patient is nervous/anxious. The patient does not have insomnia.   All other systems reviewed and are negative.  Last menstrual period 03/18/2012.There is no height or weight on file to calculate BMI.  General Appearance: Fairly Groomed  Eye Contact:  Good  Speech:  Clear and Coherent  Volume:  Normal  Mood:  "not bad"  Affect:  NA  Thought Process:  Coherent  Orientation:  Full (Time, Place, and Person)  Thought Content: Logical   Suicidal Thoughts:  No  Homicidal Thoughts:  No  Memory:  Immediate;   Good  Judgement:  Good  Insight:  Fair  Psychomotor Activity:  Normal  Concentration:  Concentration: Good and Attention Span: Good  Recall:  Good  Fund of Knowledge: Good  Language: Good  Akathisia:  No  Handed:  Right  AIMS (if indicated): N/A  Assets:  Communication Skills Desire for Improvement  ADL's:  Intact  Cognition: WNL  Sleep:  Good on Trazodone   Screenings:   Assessment and Plan:  [name] [name] is a 58 y.o. year old female with a history of schizoaffective disorder, hypertension , who presents for follow up appointment for Schizoaffective disorder, unspecified type ([place])  # Schizoaffective disorder Patient demonstrates linear thought process, which has been improved since the last visit.  Noted that she used to have circumstantial thought process with psychotic features, paranoia and occasional CAH at the initial visit.  Will continue ziprasidone to target  schizoaffective disorder.  Although it is important to obtain EKG to rule out QTC prolongation, will defer this at this time to minimize exposure in the context of COVID 19 pandemic.  Will also defer Depakote level at this time due to the same reason.  Will continue Depakote to target mood dysregulation.  Will continue fluoxetine to target depression.  Will continue trazodone as needed for insomnia.  Discussed behavioral activation.   Plan I have reviewed and updated plans as below 1.ContinueZiprasidone 40 mg twice day with meals 2.Continue fluoxetine 40 mg daily 3. ContinueDepakote500 mg twice a day (level checked in Nov 2019) 5. Continue trazodone 25-50 mg at night as needed for sleep 6. RTC 3 months, 7/24 at 10:20 for 20 mins, phone 7.CheckEKGby PCP - obtain labs at the next visit   Past trials of medication:Tofranil, Depakote Geodon, Prozac,  The patient demonstrates the following risk factors for suicide: Chronic risk factors for suicide include: psychiatric disorder of schizoaffective disorder. 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 Protective factorsfor this patient include: hope for the future. Considering these factors, the overall suicide risk at this point appears to be low.Patient isappropriate for outpatient follow up.  [name], MD 12/03/2018, 9:20 AM

## 2018-12-03 ENCOUNTER — Encounter (HOSPITAL_COMMUNITY): Payer: Self-pay | Admitting: Psychiatry

## 2018-12-03 ENCOUNTER — Other Ambulatory Visit: Payer: Self-pay

## 2018-12-03 ENCOUNTER — Ambulatory Visit (INDEPENDENT_AMBULATORY_CARE_PROVIDER_SITE_OTHER): Payer: BLUE CROSS/BLUE SHIELD | Admitting: Psychiatry

## 2018-12-03 DIAGNOSIS — F259 Schizoaffective disorder, unspecified: Secondary | ICD-10-CM

## 2018-12-03 MED ORDER — DIVALPROEX SODIUM 500 MG PO DR TAB: 500.0000 mg | DELAYED_RELEASE_TABLET | Freq: Two times a day (BID) | ORAL | 0 refills | Status: DC

## 2018-12-03 MED ORDER — ZIPRASIDONE HCL 40 MG PO CAPS: 40.0000 mg | ORAL_CAPSULE | Freq: Two times a day (BID) | ORAL | 0 refills | Status: DC

## 2018-12-03 MED ORDER — FLUOXETINE HCL 40 MG PO CAPS: 40.0000 mg | ORAL_CAPSULE | Freq: Every day | ORAL | 0 refills | Status: DC

## 2018-12-03 NOTE — Patient Instructions (Signed)
1.ContinueZiprasidone 40 mg twice day with meals 2.Continue fluoxetine 40 mg daily 3. ContinueDepakote500 mg twice a day  5. Continue trazodone 25-50 mg at night as needed for sleep 6. Next appointment 7/24 at 10:20 for 20 mins, phone 7. Check EKG with your primary care doctor

## 2018-12-04 ENCOUNTER — Other Ambulatory Visit (HOSPITAL_COMMUNITY): Payer: Self-pay | Admitting: Psychiatry

## 2018-12-04 ENCOUNTER — Telehealth (HOSPITAL_COMMUNITY): Payer: Self-pay | Admitting: Psychiatry

## 2018-12-04 DIAGNOSIS — F259 Schizoaffective disorder, unspecified: Secondary | ICD-10-CM

## 2018-12-04 NOTE — Telephone Encounter (Signed)
Labs ordered.

## 2018-12-05 LAB — COMPREHENSIVE METABOLIC PANEL
AG Ratio: 1.6 (calc) (ref 1.0–2.5)
ALT: 13 U/L (ref 6–29)
AST: 18 U/L (ref 10–35)
Albumin: 4.3 g/dL (ref 3.6–5.1)
Alkaline phosphatase (APISO): 56 U/L (ref 37–153)
BUN: 12 mg/dL (ref 7–25)
CO2: 31 mmol/L (ref 20–32)
Calcium: 9.6 mg/dL (ref 8.6–10.4)
Chloride: 100 mmol/L (ref 98–110)
Creat: 0.7 mg/dL (ref 0.50–1.05)
Globulin: 2.7 g/dL (calc) (ref 1.9–3.7)
Glucose, Bld: 115 mg/dL (ref 65–139)
Potassium: 3.9 mmol/L (ref 3.5–5.3)
Sodium: 139 mmol/L (ref 135–146)
Total Bilirubin: 0.2 mg/dL (ref 0.2–1.2)
Total Protein: 7 g/dL (ref 6.1–8.1)

## 2018-12-05 LAB — VALPROIC ACID LEVEL: Valproic Acid Lvl: 48.6 mg/L — ABNORMAL LOW (ref 50.0–100.0)

## 2018-12-07 ENCOUNTER — Encounter (HOSPITAL_COMMUNITY): Payer: Self-pay | Admitting: Psychiatry

## 2019-02-22 NOTE — Progress Notes (Deleted)
[facility] MD/PA/NP OP Progress Note  02/22/2019 9:50 AM [name] [name]  MRN:  [id number]  Chief Complaint:  HPI: *** Visit Diagnosis: No diagnosis found.  Past Psychiatric History: Please see initial evaluation for full details. I have reviewed the history. No updates at this time.     Past Medical History:  Past Medical History:  Diagnosis Date  . BV (bacterial vaginosis) 01/28/2013  . Essential hypertension, benign   . Hemorrhoids   . Obsessive-compulsive disorders   . Postmenopausal bleeding   . Schizoaffective disorder ([place])   . Tubular adenoma     Past Surgical History:  Procedure Laterality Date  . BUNIONECTOMY    . COLONOSCOPY N/A 07/05/2015   Dr.Rourk- internal hemorrhoids, redundant colon, colionic dierticulosis, colonic polyp. bx= tubular adenoma. next tcs 06/2020.   [name] HEMORRHOID BANDING  2017   Dr.Rourk    Family Psychiatric History: Please see initial evaluation for full details. I have reviewed the history. No updates at this time.     Family History:  Family History  Problem Relation Age of Onset  . Diabetes type II Maternal Grandmother   . Diabetes Maternal Grandmother   . Colon cancer Mother 63  . Hypertension Maternal Aunt     Social History:  Social History   Socioeconomic History  . Marital status: Divorced    Spouse name: Not on file  . Number of children: Not on file  . Years of education: Not on file  . Highest education level: Not on file  Occupational History  . Not on file  Social Needs  . Financial resource strain: Not on file  . Food insecurity    Worry: Not on file    Inability: Not on file  . Transportation needs    Medical: Not on file    Non-medical: Not on file  Tobacco Use  . Smoking status: Never Smoker  . Smokeless tobacco: Never Used  Substance and Sexual Activity  . Alcohol use: No  . Drug use: No  . Sexual activity: Not Currently    Birth control/protection: Post-menopausal  Lifestyle  . Physical activity    Days  per week: Not on file    Minutes per session: Not on file  . Stress: Not on file  Relationships  . Social Herbalist on phone: Not on file    Gets together: Not on file    Attends religious service: Not on file    Active member of club or organization: Not on file    Attends meetings of clubs or organizations: Not on file    Relationship status: Not on file  Other Topics Concern  . Not on file  Social History Narrative  . Not on file    Allergies:  Allergies  Allergen Reactions  . Penicillins Rash    Metabolic Disorder Labs: No results found for: HGBA1C, MPG No results found for: PROLACTIN No results found for: CHOL, TRIG, HDL, CHOLHDL, VLDL, LDLCALC Lab Results  Component Value Date   TSH 4.68 (H) 05/30/2017    Therapeutic Level Labs: No results found for: LITHIUM Lab Results  Component Value Date   VALPROATE 48.6 (L) 12/04/2018   VALPROATE 60.7 06/11/2018   No components found for:  CBMZ  Current Medications: Current Outpatient Medications  Medication Sig Dispense Refill  . calcium citrate-vitamin D (CITRACAL+D) 315-200 MG-UNIT per tablet Take 2 tablets by mouth daily.     . divalproex (DEPAKOTE) 500 MG DR tablet Take 1 tablet (500 mg  total) by mouth 2 (two) times daily. 180 tablet 0  . FLUoxetine (PROZAC) 40 MG capsule Take 1 capsule (40 mg total) by mouth daily. 90 capsule 0  . hydrochlorothiazide (HYDRODIURIL) 25 MG tablet Take 25 mg by mouth daily.    . hydrocortisone (ANUSOL-HC) 2.5 % rectal cream Place 1 application rectally 2 (two) times daily. 30 g 0  . ibuprofen (ADVIL,MOTRIN) 200 MG tablet Take 400 mg by mouth 2 (two) times daily as needed for headache.    . lisinopril (PRINIVIL,ZESTRIL) 5 MG tablet Take 5 mg by mouth daily.    . miconazole (MICOTIN) 2 % cream Apply 1 application topically as needed.    . Multiple Vitamin (MULTIVITAMIN) tablet Take 1 tablet by mouth daily.    . traZODone (DESYREL) 50 MG tablet Take 0.5 tablets (25 mg total)  by mouth at bedtime as needed for sleep. 90 tablet 0  . Wheat Dextrin (BENEFIBER DRINK MIX PO) Take by mouth.    . ziprasidone (GEODON) 40 MG capsule Take 1 capsule (40 mg total) by mouth 2 (two) times daily with a meal. 180 capsule 0   No current facility-administered medications for this visit.      Musculoskeletal: Strength & Muscle Tone: N/A Gait & Station: N/A Patient leans: N/A  Psychiatric Specialty Exam: ROS  Last menstrual period 03/18/2012.There is no height or weight on file to calculate BMI.  General Appearance: {Appearance:22683}  Eye Contact:  {BHH EYE CONTACT:22684}  Speech:  Clear and Coherent  Volume:  Normal  Mood:  {BHH MOOD:22306}  Affect:  {Affect (PAA):22687}  Thought Process:  Coherent  Orientation:  Full (Time, Place, and Person)  Thought Content: Logical   Suicidal Thoughts:  {ST/HT (PAA):22692}  Homicidal Thoughts:  {ST/HT (PAA):22692}  Memory:  Immediate;   Good  Judgement:  {Judgement (PAA):22694}  Insight:  {Insight (PAA):22695}  Psychomotor Activity:  Normal  Concentration:  Concentration: Good and Attention Span: Good  Recall:  Good  Fund of Knowledge: Good  Language: Good  Akathisia:  No  Handed:  Right  AIMS (if indicated): not done  Assets:  Communication Skills Desire for Improvement  ADL's:  Intact  Cognition: WNL  Sleep:  {B[facility] GOOD/FAIR/POOR:22877}   Screenings:   Assessment and Plan:  [name] [name] is a 58 y.o. year old female with a history of schizoaffective disorder, hypertrension , who presents for follow up appointment for No diagnosis found.  # Schizoaffective disorder  Patient demonstrates linear thought process, which has been improved since the last visit.  Noted that she used to have circumstantial thought process with psychotic features, paranoia and occasional CAH at the initial visit.  Will continue ziprasidone to target schizoaffective disorder.  Although it is important to obtain EKG to rule out QTC  prolongation, will defer this at this time to minimize exposure in the context of COVID 19 pandemic.  Will also defer Depakote level at this time due to the same reason.  Will continue Depakote to target mood dysregulation.  Will continue fluoxetine to target depression.  Will continue trazodone as needed for insomnia.  Discussed behavioral activation.   Plan  1.ContinueZiprasidone 40 mg twice day with meals 2.Continue fluoxetine 40 mg daily 3. ContinueDepakote500 mg twice a day(level checked in Nov 2019) 5. Continue trazodone 25-50 mg at night as needed for sleep 6. RTC 3 months, 7/24 at 10:20 for 20 mins, phone 7.CheckEKGby PCP - obtain labs at the next visit   Past trials of medication:Tofranil, Depakote Geodon, Prozac,  The  patient demonstrates the following risk factors for suicide: Chronic risk factors for suicide include: psychiatric disorder of schizoaffective disorder. 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 Protective factorsfor this patient include: hope for the future. Considering these factors, the overall suicide risk at this point appears to be low.Patient isappropriate for outpatient follow up.   [name], MD 02/22/2019, 9:50 AM

## 2019-02-26 ENCOUNTER — Ambulatory Visit (HOSPITAL_COMMUNITY): Payer: BLUE CROSS/BLUE SHIELD | Admitting: Psychiatry

## 2019-02-26 ENCOUNTER — Other Ambulatory Visit: Payer: Self-pay

## 2019-02-28 NOTE — Progress Notes (Signed)
BH MD/PA/NP OP Progress Note  03/01/2019 8:39 AM [name] [name]  MRN:  [id number]  Chief Complaint:  Chief Complaint    Follow-up; Other     HPI:  This is a follow-up appointment for schizoaffective disorder.  She states that she has been feeling stressed due to family issues.  She was told by her aunt that her aunt needs to take care of things for her own; the patient tries not to reach out to her so that she would not bother her aunt. She states that her cousin, age 58 asks the patient to drive him to places. It has been difficult now that she has returned to work.  She talks with her husband that she also needs to get some rest.  She enjoys work; she goes to [employer], working at [employer], five hours per day. She enjoys meeting with her friend on weekends.  Although there was a time she felt depressed, she has been managing it well.  She sleeps well.  She has good concentration.  She has good motivation and energy.  She denies SI.  She denies anxiety or panic attacks.  She denies AH, VH.  She denies paranoia.   Visit Diagnosis:    ICD-10-CM   1. Schizoaffective disorder, unspecified type ([place])  F25.9     Past Psychiatric History: Please see initial evaluation for full details. I have reviewed the history. No updates at this time.     Past Medical History:  Past Medical History:  Diagnosis Date  . BV (bacterial vaginosis) 01/28/2013  . Essential hypertension, benign   . Hemorrhoids   . Obsessive-compulsive disorders   . Postmenopausal bleeding   . Schizoaffective disorder ([place])   . Tubular adenoma     Past Surgical History:  Procedure Laterality Date  . BUNIONECTOMY    . COLONOSCOPY N/A 07/05/2015   Dr.Rourk- internal hemorrhoids, redundant colon, colionic dierticulosis, colonic polyp. bx= tubular adenoma. next tcs 06/2020.   [name] HEMORRHOID BANDING  2017   Dr.Rourk    Family Psychiatric History: Please see initial evaluation for full details. I have reviewed the  history. No updates at this time.     Family History:  Family History  Problem Relation Age of Onset  . Diabetes type II Maternal Grandmother   . Diabetes Maternal Grandmother   . Colon cancer Mother 41  . Hypertension Maternal Aunt     Social History:  Social History   Socioeconomic History  . Marital status: Divorced    Spouse name: Not on file  . Number of children: Not on file  . Years of education: Not on file  . Highest education level: Not on file  Occupational History  . Not on file  Social Needs  . Financial resource strain: Not on file  . Food insecurity    Worry: Not on file    Inability: Not on file  . Transportation needs    Medical: Not on file    Non-medical: Not on file  Tobacco Use  . Smoking status: Never Smoker  . Smokeless tobacco: Never Used  Substance and Sexual Activity  . Alcohol use: No  . Drug use: No  . Sexual activity: Not Currently    Birth control/protection: Post-menopausal  Lifestyle  . Physical activity    Days per week: Not on file    Minutes per session: Not on file  . Stress: Not on file  Relationships  . Social Herbalist on phone:  Not on file    Gets together: Not on file    Attends religious service: Not on file    Active member of club or organization: Not on file    Attends meetings of clubs or organizations: Not on file    Relationship status: Not on file  Other Topics Concern  . Not on file  Social History Narrative  . Not on file    Allergies:  Allergies  Allergen Reactions  . Penicillins Rash    Metabolic Disorder Labs: No results found for: HGBA1C, MPG No results found for: PROLACTIN No results found for: CHOL, TRIG, HDL, CHOLHDL, VLDL, LDLCALC Lab Results  Component Value Date   TSH 4.68 (H) 05/30/2017    Therapeutic Level Labs: No results found for: LITHIUM Lab Results  Component Value Date   VALPROATE 48.6 (L) 12/04/2018   VALPROATE 60.7 06/11/2018   No components found for:   CBMZ  Current Medications: Current Outpatient Medications  Medication Sig Dispense Refill  . calcium citrate-vitamin D (CITRACAL+D) 315-200 MG-UNIT per tablet Take 2 tablets by mouth daily.     . divalproex (DEPAKOTE) 500 MG DR tablet Take 1 tablet (500 mg total) by mouth 2 (two) times daily. 180 tablet 0  . FLUoxetine (PROZAC) 40 MG capsule Take 1 capsule (40 mg total) by mouth daily. 90 capsule 0  . hydrochlorothiazide (HYDRODIURIL) 25 MG tablet Take 25 mg by mouth daily.    . hydrocortisone (ANUSOL-HC) 2.5 % rectal cream Place 1 application rectally 2 (two) times daily. 30 g 0  . ibuprofen (ADVIL,MOTRIN) 200 MG tablet Take 400 mg by mouth 2 (two) times daily as needed for headache.    . lisinopril (PRINIVIL,ZESTRIL) 5 MG tablet Take 5 mg by mouth daily.    . miconazole (MICOTIN) 2 % cream Apply 1 application topically as needed.    . Multiple Vitamin (MULTIVITAMIN) tablet Take 1 tablet by mouth daily.    . traZODone (DESYREL) 50 MG tablet Take 0.5 tablets (25 mg total) by mouth at bedtime as needed for sleep. 90 tablet 0  . Wheat Dextrin (BENEFIBER DRINK MIX PO) Take by mouth.    . ziprasidone (GEODON) 40 MG capsule Take 1 capsule (40 mg total) by mouth 2 (two) times daily with a meal. 180 capsule 0   No current facility-administered medications for this visit.      Musculoskeletal: Strength & Muscle Tone: N/A Gait & Station: N/A Patient leans: N/A  Psychiatric Specialty Exam: Review of Systems  Psychiatric/Behavioral: Negative for depression, hallucinations, memory loss, substance abuse and suicidal ideas. The patient is not nervous/anxious and does not have insomnia.   All other systems reviewed and are negative.   Last menstrual period 03/18/2012.There is no height or weight on file to calculate BMI.  General Appearance: NA  Eye Contact:  NA  Speech:  Clear and Coherent  Volume:  Normal  Mood:  "fine"  Affect:  NA  Thought Process:  Coherent  Orientation:  Full (Time,  Place, and Person)  Thought Content: Logical   Suicidal Thoughts:  No  Homicidal Thoughts:  No  Memory:  Immediate;   Good  Judgement:  Good  Insight:  Fair  Psychomotor Activity:  Normal  Concentration:  Concentration: Good and Attention Span: Good  Recall:  Good  Fund of Knowledge: Good  Language: Good  Akathisia:  No  Handed:  Right  AIMS (if indicated): not done  Assets:  Communication Skills Desire for Improvement  ADL's:  Intact  Cognition:  WNL  Sleep:  Fair   Screenings:   Assessment and Plan:  WINDIE MARASCO is a 58 y.o. year old female with a history of schizoaffective disorder, hypertension, who presents for follow up appointment for schizoaffective disorder.   # Schizoaffective disorder Patient continues to demonstrate linear thought process and there is no significant psychotic symptoms.  Noted that she used to have circumstantial thought process with psychotic features, paranoia and occasional CAH at the initial visit.   Will continue ziprasidone to target schizoaffective disorder. Although it has been discussed with the patient to obtain EKG to rule out QTc, she has not been able to see PCP, and it has become more difficult due to pandemic.  Discussed risk of prolonged QTC.  Will continue fluoxetine to target depression.  Will continue Depakote to target mood dysregulation.  Will continue trazodone as needed for insomnia.  Discussed behavioral activation.   Plan I have reviewed and updated plans as below 1.ContinueZiprasidone 40 mg twice day with meals 2.Continue fluoxetine 40 mg daily 3. ContinueDepakote500 mg twice a day(level 48.6 in May ) 5. Continue trazodone 25-50 mg at night as needed for sleep (she declined refill) 6. Next appointment: 10/19 at 8:40 for 20 mins 7.CheckEKGby PCP Labs reviewed; cmp wnl, VPA 48.6  In May 2020   Past trials of medication:Tofranil, Depakote Geodon, Prozac,  The patient demonstrates the following risk factors  for suicide: Chronic risk factors for suicide include: psychiatric disorder of schizoaffective disorder. 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 Protective factorsfor this patient include: hope for the future. Considering these factors, the overall suicide risk at this point appears to be low.Patient isappropriate for outpatient follow up.    [name], MD 03/01/2019, 8:39 AM

## 2019-03-01 ENCOUNTER — Other Ambulatory Visit (HOSPITAL_COMMUNITY): Payer: Self-pay | Admitting: Family Medicine

## 2019-03-01 ENCOUNTER — Other Ambulatory Visit: Payer: Self-pay

## 2019-03-01 ENCOUNTER — Ambulatory Visit (INDEPENDENT_AMBULATORY_CARE_PROVIDER_SITE_OTHER): Payer: BC Managed Care – PPO | Admitting: Psychiatry

## 2019-03-01 ENCOUNTER — Encounter (HOSPITAL_COMMUNITY): Payer: Self-pay | Admitting: Psychiatry

## 2019-03-01 DIAGNOSIS — Z1231 Encounter for screening mammogram for malignant neoplasm of breast: Secondary | ICD-10-CM

## 2019-03-01 DIAGNOSIS — F259 Schizoaffective disorder, unspecified: Secondary | ICD-10-CM | POA: Diagnosis not present

## 2019-03-01 MED ORDER — DIVALPROEX SODIUM 500 MG PO DR TAB: 500.0000 mg | DELAYED_RELEASE_TABLET | Freq: Two times a day (BID) | ORAL | 0 refills | Status: DC

## 2019-03-01 MED ORDER — ZIPRASIDONE HCL 40 MG PO CAPS: 40.0000 mg | ORAL_CAPSULE | Freq: Two times a day (BID) | ORAL | 0 refills | Status: DC

## 2019-03-01 MED ORDER — FLUOXETINE HCL 40 MG PO CAPS: 40.0000 mg | ORAL_CAPSULE | Freq: Every day | ORAL | 0 refills | Status: DC

## 2019-03-01 NOTE — Patient Instructions (Signed)
1.ContinueZiprasidone 40 mg twice day with meals 2.Continue fluoxetine 40 mg daily 3. ContinueDepakote500 mg twice a day 5. Continue trazodone 25-50 mg at night as needed for sleep  6. Next appointment: 10/19 at 8:40

## 2019-03-06 ENCOUNTER — Emergency Department (HOSPITAL_COMMUNITY)
Admission: EM | Admit: 2019-03-06 | Discharge: 2019-03-06 | Disposition: A | Discharge status: Home / Self Care | Payer: BC Managed Care – PPO | Attending: Emergency Medicine | Admitting: Emergency Medicine

## 2019-03-06 ENCOUNTER — Other Ambulatory Visit: Payer: Self-pay

## 2019-03-06 ENCOUNTER — Encounter (HOSPITAL_COMMUNITY): Payer: Self-pay | Admitting: Emergency Medicine

## 2019-03-06 ENCOUNTER — Emergency Department (HOSPITAL_COMMUNITY): Payer: BC Managed Care – PPO

## 2019-03-06 DIAGNOSIS — Z79899 Other long term (current) drug therapy: Secondary | ICD-10-CM | POA: Diagnosis not present

## 2019-03-06 DIAGNOSIS — Y9301 Activity, walking, marching and hiking: Secondary | ICD-10-CM | POA: Insufficient documentation

## 2019-03-06 DIAGNOSIS — I10 Essential (primary) hypertension: Secondary | ICD-10-CM | POA: Insufficient documentation

## 2019-03-06 DIAGNOSIS — S52124A Nondisplaced fracture of head of right radius, initial encounter for closed fracture: Secondary | ICD-10-CM | POA: Diagnosis not present

## 2019-03-06 DIAGNOSIS — Y9289 Other specified places as the place of occurrence of the external cause: Secondary | ICD-10-CM | POA: Diagnosis not present

## 2019-03-06 DIAGNOSIS — Y998 Other external cause status: Secondary | ICD-10-CM | POA: Diagnosis not present

## 2019-03-06 DIAGNOSIS — W010XXA Fall on same level from slipping, tripping and stumbling without subsequent striking against object, initial encounter: Secondary | ICD-10-CM | POA: Insufficient documentation

## 2019-03-06 DIAGNOSIS — S59901A Unspecified injury of right elbow, initial encounter: Secondary | ICD-10-CM | POA: Diagnosis present

## 2019-03-06 LAB — CBG MONITORING, ED: Glucose-Capillary: 98 mg/dL (ref 70–99)

## 2019-03-06 NOTE — ED Triage Notes (Signed)
Pt states she tripped and fell over her own feet today. Pt denies hitting head. Pt C/O right elbow pain.

## 2019-03-06 NOTE — Discharge Instructions (Addendum)
Keep your right arm elevated and keep the splint in place.  You may use the sling as needed for support.  Call Dr. Ruthe Mannan office on Monday to arrange a follow-up appointment.

## 2019-03-07 NOTE — ED Provider Notes (Signed)
[facility] EMERGENCY DEPARTMENT Provider Note   CSN: [id number] Arrival date & time: 03/06/19  4098     History   Chief Complaint Chief Complaint  Patient presents with  . Fall    HPI [name] [name] is a 58 y.o. female.     HPI  [name] [name] is a 58 y.o. female who presents to the Emergency Department complaining of mechanical fall that occurred several hours prior to arrival.  She states that she was walking through a packing lot and tripped over the sidewalk landing on her right elbow.  She describes throbbing pain and swelling to the lateral elbow that worsens with full flexion and rotation.  She took ibuprofen prior to arrival and reports some improvement in her pain.  She denies head injury, LOC, headache, dizziness, nausea, vomiting, visual changes, neck or back pain.  She denies blood thinner use.  No numbness or weakness of the right arm.  She is right hand dominant.    Past Medical History:  Diagnosis Date  . BV (bacterial vaginosis) 01/28/2013  . Essential hypertension, benign   . Hemorrhoids   . Obsessive-compulsive disorders   . Postmenopausal bleeding   . Schizoaffective disorder ([place])   . Tubular adenoma     Patient Active Problem List   Diagnosis Date Noted  . Schizoaffective disorder, depressive type ([place]) 05/08/2017  . History of colonic polyps   . Diverticulosis of colon without hemorrhage   . Hemorrhoid   . Rectal bleeding 06/21/2015  . Family hx of colon cancer 06/21/2015  . Postmenopausal bleeding 03/18/2013  . BV (bacterial vaginosis) 01/28/2013  . Murmur, cardiac 06/02/2012  . Syncope 06/02/2012  . Essential hypertension, benign 06/02/2012  . Prolonged Q-T interval on ECG 06/02/2012    Past Surgical History:  Procedure Laterality Date  . BUNIONECTOMY    . COLONOSCOPY N/A 07/05/2015   Dr.Rourk- internal hemorrhoids, redundant colon, colionic dierticulosis, colonic polyp. bx= tubular adenoma. next tcs 06/2020.   [name] HEMORRHOID BANDING  2017    Dr.Rourk     OB History    Gravida  1   Para      Term      Preterm      AB  1   Living        SAB  1   TAB      Ectopic      Multiple      Live Births               Home Medications    Prior to Admission medications   Medication Sig Start Date End Date Taking? Authorizing Provider  calcium citrate-vitamin D (CITRACAL+D) 315-200 MG-UNIT per tablet Take 2 tablets by mouth daily.     [provider]  divalproex (DEPAKOTE) 500 MG DR tablet Take 1 tablet (500 mg total) by mouth 2 (two) times daily. 03/01/19   [name], MD  FLUoxetine (PROZAC) 40 MG capsule Take 1 capsule (40 mg total) by mouth daily. 03/01/19   [name], MD  hydrochlorothiazide (HYDRODIURIL) 25 MG tablet Take 25 mg by mouth daily.    [provider]  hydrocortisone (ANUSOL-HC) 2.5 % rectal cream Place 1 application rectally 2 (two) times daily. 09/05/16   Mahala Menghini, PA-C  ibuprofen (ADVIL,MOTRIN) 200 MG tablet Take 400 mg by mouth 2 (two) times daily as needed for headache.    [provider]  lisinopril (PRINIVIL,ZESTRIL) 5 MG tablet Take 5 mg by mouth daily.  [provider]  miconazole (MICOTIN) 2 % cream Apply 1 application topically as needed.    [provider]  Multiple Vitamin (MULTIVITAMIN) tablet Take 1 tablet by mouth daily.    [provider]  traZODone (DESYREL) 50 MG tablet Take 0.5 tablets (25 mg total) by mouth at bedtime as needed for sleep. 09/03/18   [name], MD  Wheat Dextrin (BENEFIBER DRINK MIX PO) Take by mouth.    [provider]  ziprasidone (GEODON) 40 MG capsule Take 1 capsule (40 mg total) by mouth 2 (two) times daily with a meal. 03/01/19   [name], MD    Family History Family History  Problem Relation Age of Onset  . Diabetes type II Maternal Grandmother   . Diabetes Maternal Grandmother   . Colon cancer Mother 4  . Hypertension Maternal Aunt     Social History Social History    Tobacco Use  . Smoking status: Never Smoker  . Smokeless tobacco: Never Used  Substance Use Topics  . Alcohol use: No  . Drug use: No     Allergies   Penicillins   Review of Systems Review of Systems  Constitutional: Negative for chills and fever.  Eyes: Negative for visual disturbance.  Respiratory: Negative for chest tightness and shortness of breath.   Cardiovascular: Negative for chest pain.  Gastrointestinal: Negative for abdominal pain, nausea and vomiting.  Musculoskeletal: Positive for arthralgias (right elbow pain and swelling) and joint swelling. Negative for back pain and neck pain.  Skin: Negative for color change and wound.  Neurological: Negative for dizziness, syncope, weakness, numbness and headaches.  Psychiatric/Behavioral: Negative for confusion.     Physical Exam Updated Vital Signs BP 126/88 (BP Location: Left Arm)   Pulse 81   Temp 98.2 F (36.8 C) (Oral)   Resp 16   Ht 5\' 2"  (1.575 m)   Wt 86.2 kg   LMP 03/18/2012   SpO2 98%   BMI 34.75 kg/m   Physical Exam Vitals signs and nursing note reviewed.  Constitutional:      General: She is not in acute distress.    Appearance: Normal appearance. She is not ill-appearing or toxic-appearing.  HENT:     Head: Atraumatic.  Eyes:     Extraocular Movements: Extraocular movements intact.     Pupils: Pupils are equal, round, and reactive to light.  Neck:     Musculoskeletal: Normal range of motion. No muscular tenderness.  Cardiovascular:     Rate and Rhythm: Normal rate and regular rhythm.     Pulses: Normal pulses.  Pulmonary:     Effort: Pulmonary effort is normal.     Breath sounds: Normal breath sounds.  Chest:     Chest wall: No tenderness.  Abdominal:     Palpations: Abdomen is soft.     Tenderness: There is no abdominal tenderness.  Musculoskeletal:        General: Swelling, tenderness and signs of injury present.     Right elbow: She exhibits swelling. She exhibits normal range  of motion. Tenderness found. Radial head tenderness noted.     Comments: ttp over the radial head of the right elbow.  Mild to moderate edema and slight ecchymosis noted.  No open wounds.  Right shoulder and wrist are NT.  Compartments are soft.   Skin:    General: Skin is warm.     Capillary Refill: Capillary refill takes less than 2 seconds.     Findings: No rash.  Neurological:  General: No focal deficit present.     Mental Status: She is alert.     GCS: GCS eye subscore is 4. GCS verbal subscore is 5. GCS motor subscore is 6.     Sensory: Sensation is intact. No sensory deficit.     Motor: Motor function is intact. No weakness.     Comments: CN II-XII grossly intact      ED Treatments / Results  Labs (all labs ordered are listed, but only abnormal results are displayed) Labs Reviewed  CBG MONITORING, ED    EKG None  Radiology Dg Elbow Complete Right  Result Date: 03/06/2019 CLINICAL DATA:  Status post fall.  Complains of right elbow pain. EXAM: RIGHT ELBOW - COMPLETE 3+ VIEW COMPARISON:  None. FINDINGS: No joint effusion identified. Fracture involving the lateral aspect of the radial head is identified with overlying soft tissue swelling. Chronic degenerative changes involving the elbow joint is noted and there are several loose bodies identified within the anterior and posterior joint space. IMPRESSION: Radial head fracture with overlying soft tissue swelling. 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   On: 03/06/2019 20:40    Procedures Procedures (including critical care time)  Medications Ordered in ED Medications - No data to display   Initial Impression / Assessment and Plan / ED Course  I have reviewed the triage vital signs and the nursing notes.  Pertinent labs & imaging results that were available during my care of the patient were reviewed by me and considered in my  medical decision making (see chart for details).        Pt with closed elbow injury secondary to a mechanical fall.  NV intact.  Maintains full ROM of the elbow.  Denies head injury, neck or back pain and LOC.  Discussed XR findings with the patient and need for close orthopedic f/u.  She prefers to f/u with local orthopedics and Dr. Aline Brochure and prefers to continue her home NSAID   Sugar tong splint applied and sling given.  Pain improved.  Pt agrees to care plan   Final Clinical Impressions(s) / ED Diagnoses   Final diagnoses:  Closed nondisplaced fracture of head of right radius, initial encounter    ED Discharge Orders    None       Kem Parkinson, PA-C 03/07/19 1514    Varney Biles, MD 03/09/19 2003

## 2019-03-10 ENCOUNTER — Encounter: Payer: Self-pay | Admitting: Orthopedic Surgery

## 2019-03-10 ENCOUNTER — Other Ambulatory Visit: Payer: Self-pay

## 2019-03-10 ENCOUNTER — Ambulatory Visit: Payer: BC Managed Care – PPO | Admitting: Orthopedic Surgery

## 2019-03-10 VITALS — BP 138/81 | HR 90 | Temp 90.5°F | Ht 62.0 in | Wt 200.0 lb

## 2019-03-10 DIAGNOSIS — S52124A Nondisplaced fracture of head of right radius, initial encounter for closed fracture: Secondary | ICD-10-CM | POA: Diagnosis not present

## 2019-03-10 NOTE — Progress Notes (Signed)
[name] [name]  03/10/2019  HISTORY SECTION :  Chief Complaint  Patient presents with  . Elbow Injury    03/06/19 fall on sidewalk    HPI The patient presents for evaluation of right elbow injury  [address] landed on right elbow  Pain is located over the Location lateral right elbow Duration 4 days Quality dull Severity mild Associated with stiffness  Review of Systems  All other systems reviewed and are negative.    Past Medical History:  Diagnosis Date  . BV (bacterial vaginosis) 01/28/2013  . Essential hypertension, benign   . Hemorrhoids   . Obsessive-compulsive disorders   . Postmenopausal bleeding   . Schizoaffective disorder ([place])   . Tubular adenoma     Past Surgical History:  Procedure Laterality Date  . BUNIONECTOMY    . COLONOSCOPY N/A 07/05/2015   Dr.Rourk- internal hemorrhoids, redundant colon, colionic dierticulosis, colonic polyp. bx= tubular adenoma. next tcs 06/2020.   [name] HEMORRHOID BANDING  2017   Dr.Rourk     Allergies  Allergen Reactions  . Penicillins Rash     Current Outpatient Medications:  .  calcium citrate-vitamin D (CITRACAL+D) 315-200 MG-UNIT per tablet, Take 2 tablets by mouth daily. , Disp: , Rfl:  .  divalproex (DEPAKOTE) 500 MG DR tablet, Take 1 tablet (500 mg total) by mouth 2 (two) times daily., Disp: 180 tablet, Rfl: 0 .  FLUoxetine (PROZAC) 40 MG capsule, Take 1 capsule (40 mg total) by mouth daily., Disp: 90 capsule, Rfl: 0 .  hydrochlorothiazide (HYDRODIURIL) 25 MG tablet, Take 25 mg by mouth daily., Disp: , Rfl:  .  hydrocortisone (ANUSOL-HC) 2.5 % rectal cream, Place 1 application rectally 2 (two) times daily., Disp: 30 g, Rfl: 0 .  ibuprofen (ADVIL,MOTRIN) 200 MG tablet, Take 400 mg by mouth 2 (two) times daily as needed for headache., Disp: , Rfl:  .  levothyroxine (SYNTHROID) 50 MCG tablet, , Disp: , Rfl:  .  miconazole (MICOTIN) 2 % cream, Apply 1 application topically as needed., Disp: , Rfl:  .  Multiple Vitamin  (MULTIVITAMIN) tablet, Take 1 tablet by mouth daily., Disp: , Rfl:  .  traZODone (DESYREL) 50 MG tablet, Take 0.5 tablets (25 mg total) by mouth at bedtime as needed for sleep., Disp: 90 tablet, Rfl: 0 .  Wheat Dextrin (BENEFIBER DRINK MIX PO), Take by mouth., Disp: , Rfl:  .  ziprasidone (GEODON) 40 MG capsule, Take 1 capsule (40 mg total) by mouth 2 (two) times daily with a meal., Disp: 180 capsule, Rfl: 0   PHYSICAL EXAM SECTION: 1) BP 138/81   Pulse 90   Temp (!) 90.5 F (32.5 C)   Ht 5\' 2"  (1.575 m)   Wt 200 lb (90.7 kg)   LMP 03/18/2012   BMI 36.58 kg/m   Body mass index is 36.58 kg/m. General appearance: Well-developed well-nourished no gross deformities  2) Cardiovascular normal pulse and perfusion in all 4 extremities normal color without edema  3) Neurologically deep tendon reflexes are equal and normal, no sensation loss or deficits no pathologic reflexes  4) Psychological: Awake alert and oriented x3 mood and affect normal  5) Skin no lacerations or ulcerations no nodularity no palpable masses, no erythema or nodularity  6) Musculoskeletal:  Right elbow is tender over the radial head she has decreased range of motion the shoulder upper arm humerus wrist nor nontender she is moving all her fingers she has normal sensation color and capillary refill in the hand  MEDICAL DECISION SECTION:  Encounter Diagnosis  Name Primary?  . Closed nondisplaced fracture of head of right radius, initial encounter Yes    Imaging 4 views of the elbow radial head fracture there also some loose bodies there see report which is been reviewed  Plan:  (Rx., Inj., surg., Frx, MRI/CT, XR:2)  Recommend active range of motion sling for comfort come back in 3 weeks repeat the elbow x-ray  2:54 PM Arther Abbott, MD  03/10/2019

## 2019-03-10 NOTE — Patient Instructions (Addendum)
Wear the sling all the time except for going to bed  Remove the sling 3 times a day and bend the arm 20 times in each direction for the next 3 weeks  Come back in 3 weeks Radial Head Fracture  A radial head fracture is a break in the smaller bone in your forearm (radius) near the end of the bone at the elbow joint. There are two bones in your forearm. The radius, or radial bone, is the bone on the side of your thumb. There are different types of radial head fractures. The type is determined by the amount of movement (displacement) of bones from their normal positions:  Type 1. This is a small fracture in which the bone pieces remain together (nondisplaced fracture).  Type 2. The fracture is moderate, and bone pieces are slightly displaced.  Type 3. There are multiple fractures and displaced bone pieces. What are the causes? This condition is usually caused by falling and landing on an outstretched arm. What increases the risk? You are more likely to develop this condition if you:  Are female.  Are 61-39 years old.  Have weak bones or osteoporosis. What are the signs or symptoms? Symptoms of this condition include:  Swelling of the elbow joint.  Pain and difficulty when moving the elbow joint. How is this diagnosed? This condition is diagnosed based on your medical history and a physical exam. You may have X-rays to confirm the type of fracture. How is this treated? Treatment for this condition includes resting, icing, and raising (elevating) the injured area above the level of your heart. You may be given medicines to help relieve pain. Treatment varies depending on the type of fracture you have.  For a type 1 fracture, you may be given a splint or sling to keep your arm and elbow from moving for several days.  For a type 2 fracture, you may be given a splint or sling to keep your arm and elbow from moving for several days. If the displacement is more severe, you may need  surgery.  For a type 3 fracture, you will usually need surgery to have bone pieces removed. The entire radial head may need to be removed if the damage is severe. Follow these instructions at home: Medicines  Take over-the-counter and prescription medicines only as told by your health care provider.  Ask your health care provider if the medicine prescribed to you: ? Requires you to avoid driving or using heavy machinery. ? Can cause constipation. You may need to take these actions to prevent or treat constipation:  Drink enough fluid to keep your urine pale yellow.  Take over-the-counter or prescription medicines.  Eat foods that are high in fiber, such as beans, whole grains, and fresh fruits and vegetables.  Limit foods that are high in fat and processed sugars, such as fried or sweet foods. If you have a splint or sling:  Wear the splint or sling as told by your health care provider. Remove it only as told by your health care provider.  Loosen it if your fingers tingle, become numb, or turn cold and blue.  Keep it clean and dry. If you have a splint:  Do not put pressure on any part of the splint until it is fully hardened. This may take several hours.  Check the skin around it every day. Tell your health care provider about any concerns. Bathing  Do not take baths, swim, or use a hot tub until your  health care provider approves. Ask your health care provider if you may take showers. You may only be allowed to take sponge baths.  If the splint or sling is not waterproof: ? Do not let it get wet. ? Cover it with a watertight covering when you take a bath or shower. Managing pain, stiffness, and swelling   If directed, put ice on the injured area. ? If you have a removable splint or sling, remove it as told by your health care provider. ? Put ice in a plastic bag. ? Place a towel between your skin and the bag. ? Leave the ice on for 20 minutes, 2-3 times a day.  Move  your fingers often to reduce stiffness and swelling.  Raise (elevate) the injured area above the level of your heart while you are sitting or lying down. Activity  Ask your health care provider when it is safe to drive if you have a splint or sling on your arm.  Do not lift anything that is heavier than 10 lb (4.5 kg), or the limit that you are told, until your health care provider says that it is safe.  Return to your normal activities as told by your health care provider. Ask your health care provider what activities are safe for you.  Do exercises as told by your health care provider or physical therapist. General instructions  Do not use any products that contain nicotine or tobacco, such as cigarettes, e-cigarettes, and chewing tobacco. These can delay bone healing. If you need help quitting, ask your health care provider.  Keep all follow-up visits as told by your health care provider. This is important. Contact a health care provider if:  You have problems with your splint.  You have pain or swelling that gets worse. Get help right away if:  You have severe pain.  You have fluid or a bad smell coming from your splint.  Your hand or fingers get cold or turn pale or blue.  You lose feeling in any part of your hand or arm. Summary  A radial head fracture is a break in the smaller bone in your forearm (radius) near the end of the bone at the elbow joint.  This condition usually happens because of an injury, such as falling on an outstretched arm.  This condition may be treated with rest, ice, elevation, pain medicines, a splint or sling, and surgery. Treatment will vary depending on the type of fracture you have. This information is not intended to replace advice given to you by your health care provider. Make sure you discuss any questions you have with your health care provider. Document Released: 05/13/2006 Document Revised: 07/30/2018 Document Reviewed:  07/30/2018 Elsevier Patient Education  2020 [employer].

## 2019-03-25 ENCOUNTER — Other Ambulatory Visit: Payer: Self-pay

## 2019-03-25 DIAGNOSIS — Z20822 Contact with and (suspected) exposure to covid-19: Secondary | ICD-10-CM

## 2019-03-27 LAB — NOVEL CORONAVIRUS, NAA: SARS-CoV-2, NAA: NOT DETECTED

## 2019-03-29 ENCOUNTER — Other Ambulatory Visit: Payer: Self-pay

## 2019-03-29 ENCOUNTER — Ambulatory Visit (HOSPITAL_COMMUNITY)
Admission: RE | Admit: 2019-03-29 | Discharge: 2019-03-29 | Disposition: A | Discharge status: Home / Self Care | Payer: BC Managed Care – PPO | Source: Ambulatory Visit | Attending: Family Medicine | Admitting: Family Medicine

## 2019-03-29 DIAGNOSIS — Z1231 Encounter for screening mammogram for malignant neoplasm of breast: Secondary | ICD-10-CM

## 2019-03-30 DIAGNOSIS — S52126D Nondisplaced fracture of head of unspecified radius, subsequent encounter for closed fracture with routine healing: Secondary | ICD-10-CM | POA: Insufficient documentation

## 2019-03-30 HISTORY — DX: Nondisplaced fracture of head of unspecified radius, subsequent encounter for closed fracture with routine healing: S52.126D

## 2019-04-01 ENCOUNTER — Encounter: Payer: Self-pay | Admitting: Orthopedic Surgery

## 2019-04-01 ENCOUNTER — Ambulatory Visit: Payer: BC Managed Care – PPO

## 2019-04-01 ENCOUNTER — Other Ambulatory Visit: Payer: Self-pay

## 2019-04-01 ENCOUNTER — Ambulatory Visit (INDEPENDENT_AMBULATORY_CARE_PROVIDER_SITE_OTHER): Payer: BC Managed Care – PPO | Admitting: Orthopedic Surgery

## 2019-04-01 VITALS — Temp 97.2°F

## 2019-04-01 DIAGNOSIS — S52124D Nondisplaced fracture of head of right radius, subsequent encounter for closed fracture with routine healing: Secondary | ICD-10-CM | POA: Diagnosis not present

## 2019-04-01 NOTE — Progress Notes (Signed)
Patient ID: [name] [name], female   DOB: July 30, 1961, 58 y.o.   MRN: [id number]  FRACTURE CARE   Chief Complaint  Patient presents with  . Elbow Injury    03/06/2019 right elbow fracture, improving     Encounter Diagnosis  Name Primary?  . Closed nondisplaced fracture of head of right radius with routine healing, subsequent encounter 03/06/2019 Yes    CURRENT TREATMENT : sling arom  POST INJURY DAY: [place] 22/90  She has full range of motion mild discomfort with flexion no pain with pronation supination  X-ray looks like the fracture is stable  Patient was released today normal activities avoid heavy lifting for 2 weeks

## 2019-04-20 ENCOUNTER — Other Ambulatory Visit (HOSPITAL_COMMUNITY): Payer: Self-pay | Admitting: Psychiatry

## 2019-04-20 ENCOUNTER — Telehealth (HOSPITAL_COMMUNITY): Payer: Self-pay | Admitting: *Deleted

## 2019-04-20 ENCOUNTER — Telehealth (HOSPITAL_COMMUNITY): Payer: Self-pay | Admitting: Psychiatry

## 2019-04-20 DIAGNOSIS — F259 Schizoaffective disorder, unspecified: Secondary | ICD-10-CM

## 2019-04-20 NOTE — Telephone Encounter (Signed)
Ordered EKG with indication. Please print it out and mail it to the patient.

## 2019-04-20 NOTE — Telephone Encounter (Signed)
PATIENT CALLED STATED THAT THE EKG YOU REQUESTED FOR HER TO HAVE HER PCP ISN'T DOING OFFICE VISIT  & THAT SHE WOULD NEED A LETTER STATING ORDER & REASON TO TAKE TO OFFICE. SO THEY CAN SCHEDULED HER EITHER IN OFFICE   OR @  HOSPITAL FOR EKG

## 2019-04-20 NOTE — Telephone Encounter (Signed)
Ordered EKG to check for QTc prolongation

## 2019-04-20 NOTE — Telephone Encounter (Signed)
Letter was not in Epic for EKG w/ indication

## 2019-04-21 NOTE — Telephone Encounter (Signed)
Called to Inform Patient per provider : Ordered EKG with indication. Printed  it out and I can put in mail to be sent to  Patient.    Patient said she will come pick up the order

## 2019-04-21 NOTE — Telephone Encounter (Signed)
It is placed as an order. Please print it out and mail it to her.

## 2019-04-22 ENCOUNTER — Ambulatory Visit (HOSPITAL_COMMUNITY)
Admission: RE | Admit: 2019-04-22 | Discharge: 2019-04-22 | Disposition: A | Discharge status: Home / Self Care | Payer: BC Managed Care – PPO | Source: Ambulatory Visit | Attending: Family Medicine | Admitting: Family Medicine

## 2019-04-22 ENCOUNTER — Other Ambulatory Visit: Payer: Self-pay

## 2019-04-22 ENCOUNTER — Inpatient Hospital Stay (HOSPITAL_COMMUNITY): Admission: RE | Admit: 2019-04-22 | Payer: BC Managed Care – PPO | Source: Ambulatory Visit

## 2019-05-12 IMAGING — MG DIGITAL DIAGNOSTIC UNILATERAL LEFT MAMMOGRAM WITH TOMO AND CAD
6 series · 6 of 18 positions shown · non-contrast
Comparison: 03/23/2018

CLINICAL DATA: Possible mass identified the left breast on recent
screening mammogram.

EXAM:
DIGITAL DIAGNOSTIC UNILATERAL LEFT MAMMOGRAM WITH CAD AND TOMO

[L MLO synth-2D (1 of 2)]
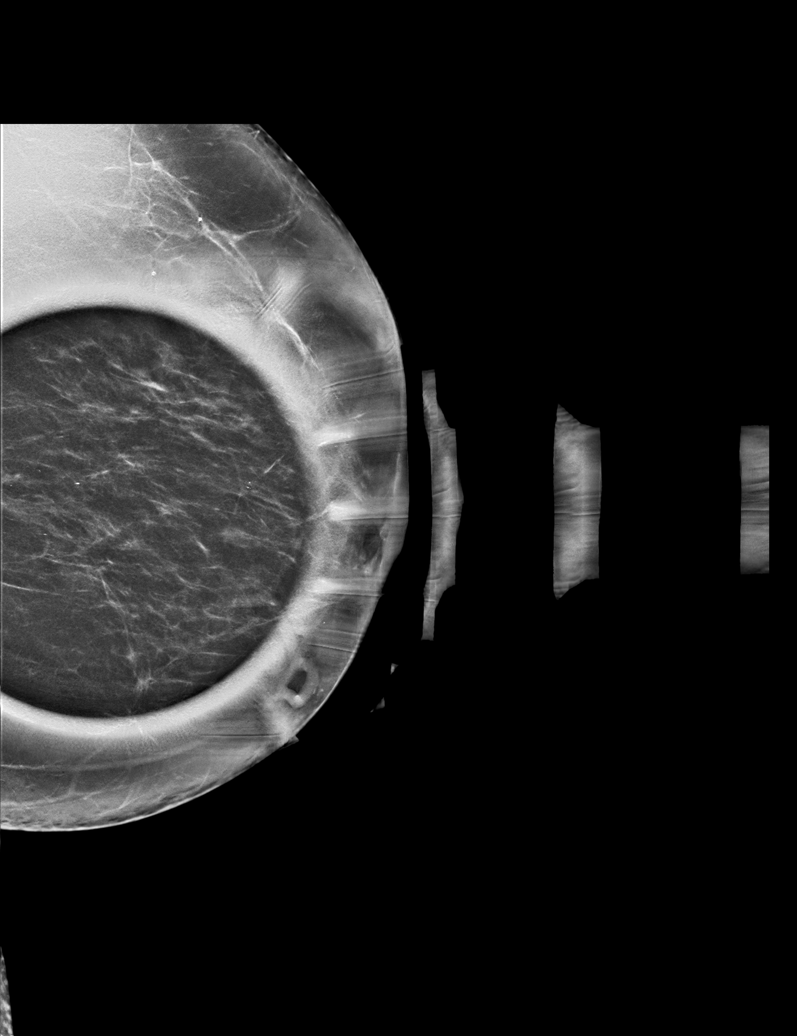

[L MLO synth-2D (2 of 2)]
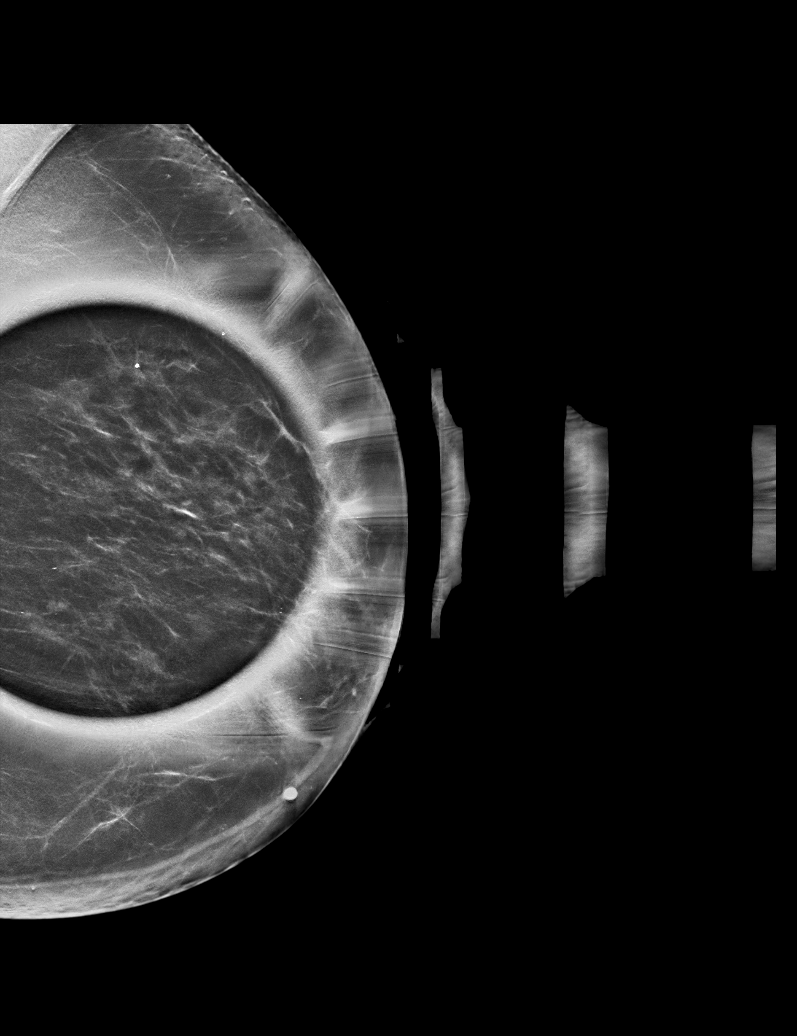

[L CC synth-2D]
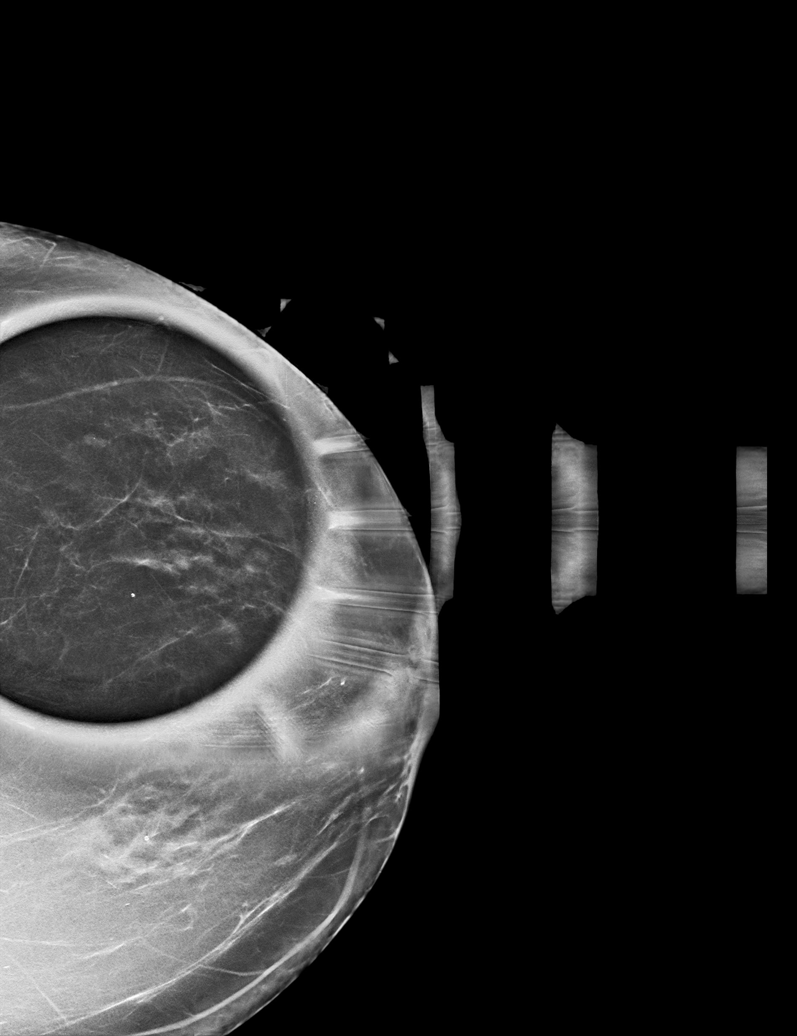

[L CC tomo · tomo slice 31/60.0]
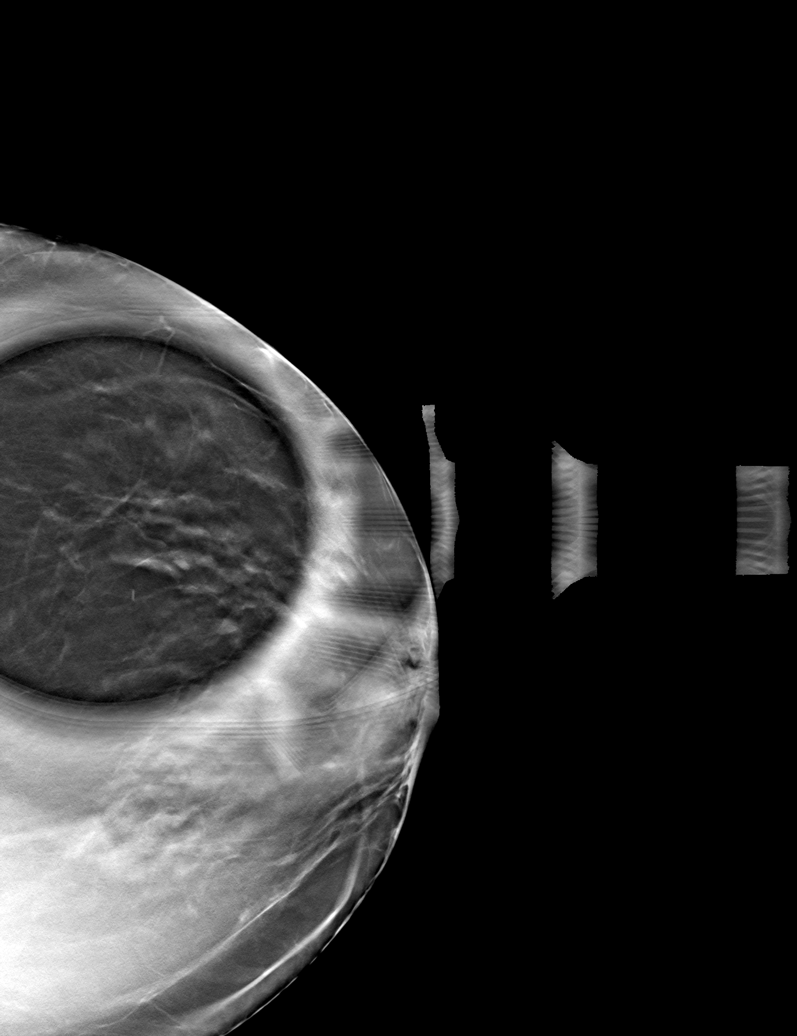

[L MLO tomo (1 of 2) · tomo slice 35/68.0]
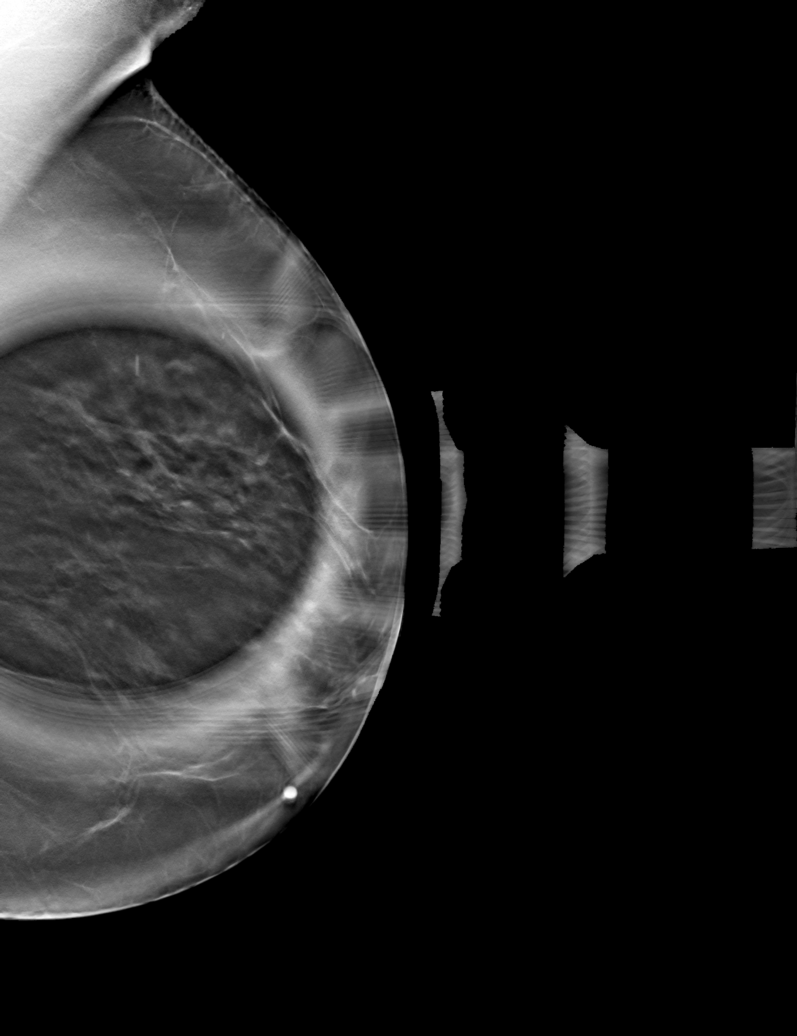

[L MLO tomo (2 of 2) · tomo slice 33/64.0]
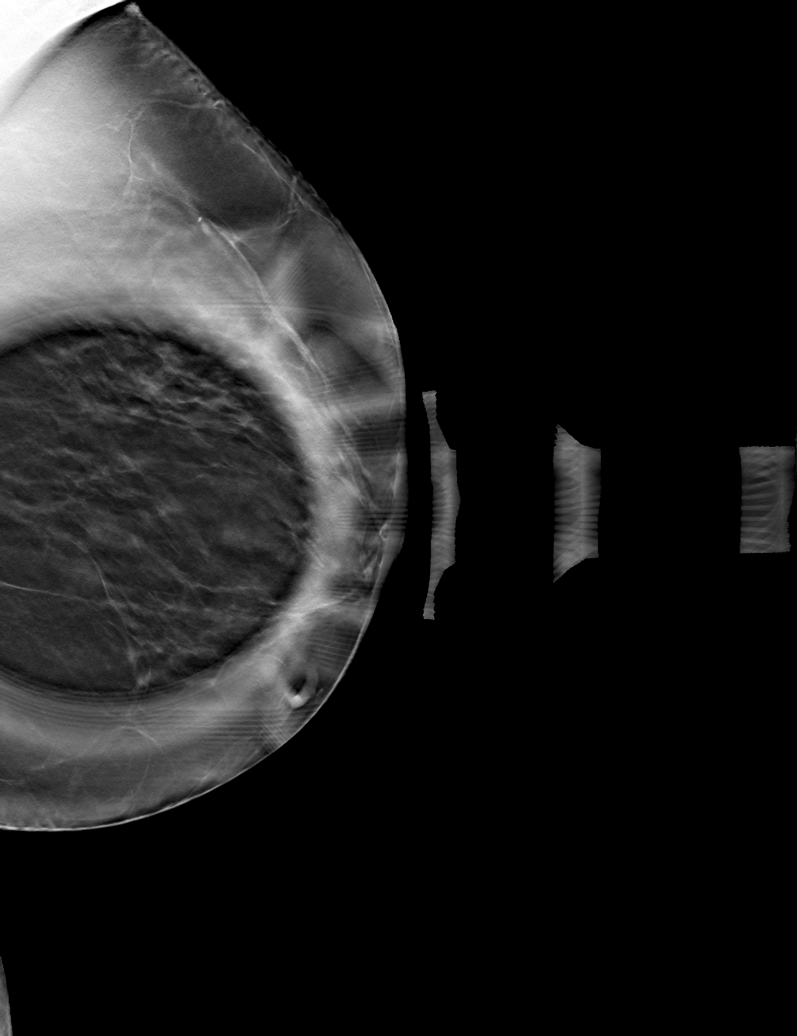

[6 of 18 positions shown; findings below may reference images not displayed]

ACR Breast Density Category b: There are scattered areas of
fibroglandular density.
FINDINGS: Focal spot compression views of the outer left breast show no
persistent mass or distortion. The parenchymal pattern appears
stable compared to prior mammograms. No suspicious findings.

Mammographic images were processed with CAD.
IMPRESSION: No evidence of malignancy in the left breast.

RECOMMENDATION:
Screening mammogram in one year.(Code:[id number])

I have discussed the findings and recommendations with the patient.
Results were also provided in writing at the conclusion of the
visit. If applicable, a reminder letter will be sent to the patient
regarding the next appointment.

BI-RADS CATEGORY  1: Negative.

## 2019-05-17 NOTE — Progress Notes (Signed)
Virtual Visit via Telephone Note  I connected with [name] [name] on 05/24/19 at  8:40 AM EDT by telephone and verified that I am speaking with the correct person using two identifiers.   I discussed the limitations, risks, security and privacy concerns of performing an evaluation and management service by telephone and the availability of in person appointments. I also discussed with the patient that there may be a patient responsible charge related to this service. The patient expressed understanding and agreed to proceed.     I discussed the assessment and treatment plan with the patient. The patient was provided an opportunity to ask questions and all were answered. The patient agreed with the plan and demonstrated an understanding of the instructions.   The patient was advised to call back or seek an in-person evaluation if the symptoms worsen or if the condition fails to improve as anticipated.  I provided 15 minutes of non-face-to-face time during this encounter.   [name], MD    [facility] MD/PA/NP OP Progress Note  05/24/2019 9:02 AM [name] [name]  MRN:  [id number]  Chief Complaint:  Chief Complaint    Other; Follow-up     HPI:  This is a follow-up appointment for schizoaffective disorder.  She states that she has been doing well.  She continues to work at [employer] in [place], [place], [place] and [place]. She felt good when she received a pen and a jacket as a gift for ten years of employment (part time job) the other day. She loves her job. She enjoys reading and watching TV. Although she feels lonely, which makes her feel depressed at times, she usually feels better afterwards.  Although her ex-husband in [place] wants to be back together with the patient, she does not want to do so due to his alcohol use.  She occasionally talks with her aunt.  She denies insomnia.  She has good motivation and energy.  She has good concentration.  She notices that she has increased  appetite.  She denies SI.  She feels less anxious.  She denies decreased need for sleep or euphoria.  She denies paranoia.  She denies AH, VH.   200 lbs Wt Readings from Last 3 Encounters:  03/10/19 200 lb (90.7 kg)  03/06/19 190 lb (86.2 kg)  09/03/18 195 lb (88.5 kg)     Visit Diagnosis:    ICD-10-CM   1. Schizoaffective disorder, unspecified type ([place])  F25.9 CBC    Valproic Acid level    Comprehensive Metabolic Panel (CMET)    Past Psychiatric History: Please see initial evaluation for full details. I have reviewed the history. No updates at this time.     Past Medical History:  Past Medical History:  Diagnosis Date  . BV (bacterial vaginosis) 01/28/2013  . Essential hypertension, benign   . Hemorrhoids   . Obsessive-compulsive disorders   . Postmenopausal bleeding   . Schizoaffective disorder ([place])   . Tubular adenoma     Past Surgical History:  Procedure Laterality Date  . BUNIONECTOMY    . COLONOSCOPY N/A 07/05/2015   Dr.Rourk- internal hemorrhoids, redundant colon, colionic dierticulosis, colonic polyp. bx= tubular adenoma. next tcs 06/2020.   [name] HEMORRHOID BANDING  2017   Dr.Rourk    Family Psychiatric History: Please see initial evaluation for full details. I have reviewed the history. No updates at this time.     Family History:  Family History  Problem Relation Age of Onset  . Diabetes type II Maternal  Grandmother   . Diabetes Maternal Grandmother   . Colon cancer Mother 65  . Hypertension Maternal Aunt     Social History:  Social History   Socioeconomic History  . Marital status: Divorced    Spouse name: Not on file  . Number of children: Not on file  . Years of education: Not on file  . Highest education level: Not on file  Occupational History  . Not on file  Social Needs  . Financial resource strain: Not on file  . Food insecurity    Worry: Not on file    Inability: Not on file  . Transportation needs    Medical: Not on file     Non-medical: Not on file  Tobacco Use  . Smoking status: Never Smoker  . Smokeless tobacco: Never Used  Substance and Sexual Activity  . Alcohol use: No  . Drug use: No  . Sexual activity: Not Currently    Birth control/protection: Post-menopausal  Lifestyle  . Physical activity    Days per week: Not on file    Minutes per session: Not on file  . Stress: Not on file  Relationships  . Social Herbalist on phone: Not on file    Gets together: Not on file    Attends religious service: Not on file    Active member of club or organization: Not on file    Attends meetings of clubs or organizations: Not on file    Relationship status: Not on file  Other Topics Concern  . Not on file  Social History Narrative  . Not on file    Allergies:  Allergies  Allergen Reactions  . Penicillins Rash    Metabolic Disorder Labs: No results found for: HGBA1C, MPG No results found for: PROLACTIN No results found for: CHOL, TRIG, HDL, CHOLHDL, VLDL, LDLCALC Lab Results  Component Value Date   TSH 4.68 (H) 05/30/2017    Therapeutic Level Labs: No results found for: LITHIUM Lab Results  Component Value Date   VALPROATE 48.6 (L) 12/04/2018   VALPROATE 60.7 06/11/2018   No components found for:  CBMZ  Current Medications: Current Outpatient Medications  Medication Sig Dispense Refill  . calcium citrate-vitamin D (CITRACAL+D) 315-200 MG-UNIT per tablet Take 2 tablets by mouth daily.     Derrill Memo ON 06/01/2019] divalproex (DEPAKOTE) 500 MG DR tablet Take 1 tablet (500 mg total) by mouth 2 (two) times daily. 180 tablet 1  . [START ON 06/01/2019] FLUoxetine (PROZAC) 40 MG capsule Take 1 capsule (40 mg total) by mouth daily. 90 capsule 1  . hydrochlorothiazide (HYDRODIURIL) 25 MG tablet Take 25 mg by mouth daily.    . hydrocortisone (ANUSOL-HC) 2.5 % rectal cream Place 1 application rectally 2 (two) times daily. 30 g 0  . ibuprofen (ADVIL,MOTRIN) 200 MG tablet Take 400 mg by mouth  2 (two) times daily as needed for headache.    . levothyroxine (SYNTHROID) 50 MCG tablet     . miconazole (MICOTIN) 2 % cream Apply 1 application topically as needed.    . Multiple Vitamin (MULTIVITAMIN) tablet Take 1 tablet by mouth daily.    . traZODone (DESYREL) 50 MG tablet Take 0.5 tablets (25 mg total) by mouth at bedtime as needed for sleep. 90 tablet 0  . Wheat Dextrin (BENEFIBER DRINK MIX PO) Take by mouth.    Derrill Memo ON 06/01/2019] ziprasidone (GEODON) 40 MG capsule Take 1 capsule (40 mg total) by mouth 2 (two) times daily  with a meal. 180 capsule 1   No current facility-administered medications for this visit.      Musculoskeletal: Strength & Muscle Tone: N/A Gait & Station: N/A Patient leans: N/A  Psychiatric Specialty Exam: Review of Systems  Psychiatric/Behavioral: Positive for depression. Negative for hallucinations, memory loss, substance abuse and suicidal ideas. The patient is nervous/anxious. The patient does not have insomnia.   All other systems reviewed and are negative.   Last menstrual period 03/18/2012.There is no height or weight on file to calculate BMI.  General Appearance: NA  Eye Contact:  NA  Speech:  Clear and Coherent  Volume:  Normal  Mood:  "fine  Affect:  NA  Thought Process:  Coherent  Orientation:  Full (Time, Place, and Person)  Thought Content: Logical   Suicidal Thoughts:  No  Homicidal Thoughts:  No  Memory:  Immediate;   Good  Judgement:  Good  Insight:  Good  Psychomotor Activity:  Normal  Concentration:  Concentration: Good and Attention Span: Good  Recall:  Good  Fund of Knowledge: Good  Language: Good  Akathisia:  No  Handed:  Right  AIMS (if indicated): not done  Assets:  Communication Skills Desire for Improvement  ADL's:  Intact  Cognition: WNL  Sleep:  Good   Screenings:   Assessment and Plan:  [name] [name] is a 58 y.o. year old female with a history of schizoaffective disorder, hypertension , who presents  for follow up appointment for Schizoaffective disorder, unspecified type ([place]) - Plan: CBC, Valproic Acid level, Comprehensive Metabolic Panel (CMET)  # Schizoaffective disorder She continues to demonstrate linear thought process, and she has had no significant psychotic symptoms since her last visit.  Noted thatshe used to have circumstantial thought process with psychotic features,paranoia and occasional CAH at the initial visit. We will continue ziprasidone to target schizoaffective disorder.  Discussed potential metabolic side effect and QTC prolongation.  We will continue fluoxetine to target depression.  We will continue Depakote for mood dysregulation.  We will continue trazodone as needed for insomnia.  Discussed behavioral activation.   Plan I have reviewed and updated plans as below 1.ContinueZiprasidone 40 mg twice day with meals (QTc 464 msec September 2020) 2.Continue fluoxetine 40 mg daily 3. ContinueDepakote500 mg twice a day(level 48.6 in May ) 5.Continuetrazodone 25-50 mg at night as needed for sleep (she declined refill) 6. Next appointment: in January 7. Check VPA, CMP, Plt in Nov Labs reviewed; cmp wnl, VPA 48.6  In May 2020   Past trials of medication:Tofranil, Depakote Geodon, Prozac,  The patient demonstrates the following risk factors for suicide: Chronic risk factors for suicide include: psychiatric disorder of schizoaffective disorder. 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 Protective factorsfor this patient include: hope for the future. Considering these factors, the overall suicide risk at this point appears to be low.Patient isappropriate for outpatient follow up.  [name], MD 05/24/2019, 9:02 AM

## 2019-05-24 ENCOUNTER — Ambulatory Visit (INDEPENDENT_AMBULATORY_CARE_PROVIDER_SITE_OTHER): Payer: BC Managed Care – PPO | Admitting: Psychiatry

## 2019-05-24 ENCOUNTER — Other Ambulatory Visit: Payer: Self-pay

## 2019-05-24 ENCOUNTER — Encounter (HOSPITAL_COMMUNITY): Payer: Self-pay | Admitting: Psychiatry

## 2019-05-24 DIAGNOSIS — F259 Schizoaffective disorder, unspecified: Secondary | ICD-10-CM

## 2019-05-24 MED ORDER — DIVALPROEX SODIUM 500 MG PO DR TAB: 500.0000 mg | DELAYED_RELEASE_TABLET | Freq: Two times a day (BID) | ORAL | 1 refills | Status: DC

## 2019-05-24 MED ORDER — ZIPRASIDONE HCL 40 MG PO CAPS: 40.0000 mg | ORAL_CAPSULE | Freq: Two times a day (BID) | ORAL | 1 refills | Status: DC

## 2019-05-24 MED ORDER — FLUOXETINE HCL 40 MG PO CAPS: 40.0000 mg | ORAL_CAPSULE | Freq: Every day | ORAL | 1 refills | Status: DC

## 2019-05-24 NOTE — Patient Instructions (Signed)
1.ContinueZiprasidone 40 mg twice day with meals  2.Continue fluoxetine 40 mg daily 3. ContinueDepakote500 mg twice a day 5.Continuetrazodone 25-50 mg at night as needed for sleep 6. Next appointment: in January 7. Check blood test in November (VPA, CMP, Plt)

## 2019-06-23 ENCOUNTER — Other Ambulatory Visit (HOSPITAL_COMMUNITY): Payer: Self-pay | Admitting: *Deleted

## 2019-06-23 DIAGNOSIS — F259 Schizoaffective disorder, unspecified: Secondary | ICD-10-CM

## 2019-06-26 LAB — COMPREHENSIVE METABOLIC PANEL
AG Ratio: 1.4 (calc) (ref 1.0–2.5)
ALT: 10 U/L (ref 6–29)
AST: 15 U/L (ref 10–35)
Albumin: 4 g/dL (ref 3.6–5.1)
Alkaline phosphatase (APISO): 51 U/L (ref 37–153)
BUN/Creatinine Ratio: 29 (calc) — ABNORMAL HIGH (ref 6–22)
BUN: 14 mg/dL (ref 7–25)
CO2: 29 mmol/L (ref 20–32)
Calcium: 9.2 mg/dL (ref 8.6–10.4)
Chloride: 102 mmol/L (ref 98–110)
Creat: 0.48 mg/dL — ABNORMAL LOW (ref 0.50–1.05)
Globulin: 2.8 g/dL (calc) (ref 1.9–3.7)
Glucose, Bld: 109 mg/dL (ref 65–139)
Potassium: 4.2 mmol/L (ref 3.5–5.3)
Sodium: 140 mmol/L (ref 135–146)
Total Bilirubin: 0.3 mg/dL (ref 0.2–1.2)
Total Protein: 6.8 g/dL (ref 6.1–8.1)

## 2019-06-26 LAB — CBC
HCT: 40.1 % (ref 35.0–45.0)
Hemoglobin: 13.5 g/dL (ref 11.7–15.5)
MCH: 30.9 pg (ref 27.0–33.0)
MCHC: 33.7 g/dL (ref 32.0–36.0)
MCV: 91.8 fL (ref 80.0–100.0)
MPV: 10 fL (ref 7.5–12.5)
Platelets: 274 10*3/uL (ref 140–400)
RBC: 4.37 10*6/uL (ref 3.80–5.10)
RDW: 12.7 % (ref 11.0–15.0)
WBC: 8.8 10*3/uL (ref 3.8–10.8)

## 2019-06-26 LAB — VALPROIC ACID LEVEL: Valproic Acid Lvl: 49.5 mg/L — ABNORMAL LOW (ref 50.0–100.0)

## 2019-06-30 ENCOUNTER — Other Ambulatory Visit: Payer: Self-pay

## 2019-06-30 DIAGNOSIS — Z20822 Contact with and (suspected) exposure to covid-19: Secondary | ICD-10-CM

## 2019-07-02 LAB — NOVEL CORONAVIRUS, NAA: SARS-CoV-2, NAA: NOT DETECTED

## 2019-07-16 ENCOUNTER — Telehealth (HOSPITAL_COMMUNITY): Payer: Self-pay | Admitting: *Deleted

## 2019-07-16 MED ORDER — TRAZODONE HCL 50 MG PO TABS: 25.0000 mg | ORAL_TABLET | Freq: Every evening | ORAL | 0 refills | Status: DC | PRN

## 2019-07-16 MED ORDER — ZIPRASIDONE HCL 40 MG PO CAPS: 40.0000 mg | ORAL_CAPSULE | Freq: Two times a day (BID) | ORAL | 0 refills | Status: DC

## 2019-07-16 MED ORDER — DIVALPROEX SODIUM 500 MG PO DR TAB: 500.0000 mg | DELAYED_RELEASE_TABLET | Freq: Two times a day (BID) | ORAL | 0 refills | Status: DC

## 2019-07-16 MED ORDER — FLUOXETINE HCL 40 MG PO CAPS: 40.0000 mg | ORAL_CAPSULE | Freq: Every day | ORAL | 0 refills | Status: DC

## 2019-07-16 NOTE — Telephone Encounter (Signed)
PATIENT CALLED FOR 2 REASONS THIS AM: 1) CHANGING Rx (UPDATED IN EPIC) & 2) REQUESTING REFILLS ON ALL PSYCH MED'S BE SENT TO NEW Rx (UPDATED IN EPIC). OFFICE NOTE 10/19 PATIENT PREVIOUSLY DECLINED REFILL FOR TRAZODONE & MENTIONED TO PATIENT & SHE STATED YES I DID BUT I NEED IT BACK. NEXT APPT 09/10/19   1.ContinueZiprasidone 40 mg twice day with meals (QTc 464 msec September 2020) 2.Continue fluoxetine 40 mg daily 3. ContinueDepakote500 mg twice a day(level48.6inMay) 4.Continuetrazodone 25-50 mg at night as needed for sleep(she declined refill)

## 2019-07-16 NOTE — Addendum Note (Signed)
Addended by: [place] Crane on: 07/16/2019 08:53 AM   Modules accepted: Orders

## 2019-07-16 NOTE — Telephone Encounter (Signed)
Prescriptions sent

## 2019-08-24 ENCOUNTER — Ambulatory Visit (HOSPITAL_COMMUNITY): Payer: BC Managed Care – PPO | Admitting: Psychiatry

## 2019-09-08 ENCOUNTER — Other Ambulatory Visit: Payer: Self-pay

## 2019-09-08 ENCOUNTER — Ambulatory Visit: Payer: BC Managed Care – PPO | Admitting: Psychiatry

## 2019-09-29 NOTE — Progress Notes (Signed)
Virtual Visit via Telephone Note  I connected with [name] [name] on 09/30/19 at  4:20 PM EST by telephone and verified that I am speaking with the correct person using two identifiers.   I discussed the limitations, risks, security and privacy concerns of performing an evaluation and management service by telephone and the availability of in person appointments. I also discussed with the patient that there may be a patient responsible charge related to this service. The patient expressed understanding and agreed to proceed.      I discussed the assessment and treatment plan with the patient. The patient was provided an opportunity to ask questions and all were answered. The patient agreed with the plan and demonstrated an understanding of the instructions.   The patient was advised to call back or seek an in-person evaluation if the symptoms worsen or if the condition fails to improve as anticipated.  I provided 12 minutes of non-face-to-face time during this encounter.   [name], MD    [facility] MD/PA/NP OP Progress Note  09/30/2019 4:39 PM [name] [name]  MRN:  [id number]  Chief Complaint:  Chief Complaint    Follow-up; Other     HPI:  This is a follow-up appointment for schizoaffective disorder.  She states that her ex-husband deceased on 20-Sep-2022. He was suffering from heart condition. She feels lost after losing him. She has been able to talk with her friend in church, and her aunt. She believes that her spirit is good, although she misses him. She continues to do part time job at [employer]. She is frustrated with a man who does not pay for rent. She is planning to talk with a sheriff. She denies insomnia.  She feels down at times.  She has fair concentration and energy.  She has good appetite.  She denies SI.  She feels anxious at times.  She enjoys watching TV or cooking.  She denies decreased need for sleep or euphoria.  She denies panic attacks.  She denies AH, VH.  She  denies paranoia.   Visit Diagnosis:    ICD-10-CM   1. Schizoaffective disorder, unspecified type ([place])  F25.9     Past Psychiatric History: Please see initial evaluation for full details. I have reviewed the history. No updates at this time.     Past Medical History:  Past Medical History:  Diagnosis Date  . BV (bacterial vaginosis) 01/28/2013  . Essential hypertension, benign   . Hemorrhoids   . Obsessive-compulsive disorders   . Postmenopausal bleeding   . Schizoaffective disorder ([place])   . Tubular adenoma     Past Surgical History:  Procedure Laterality Date  . BUNIONECTOMY    . COLONOSCOPY N/A 07/05/2015   Dr.Rourk- internal hemorrhoids, redundant colon, colionic dierticulosis, colonic polyp. bx= tubular adenoma. next tcs 06/2020.   [name] HEMORRHOID BANDING  2017   Dr.Rourk    Family Psychiatric History: Please see initial evaluation for full details. I have reviewed the history. No updates at this time.     Family History:  Family History  Problem Relation Age of Onset  . Diabetes type II Maternal Grandmother   . Diabetes Maternal Grandmother   . Colon cancer Mother 31  . Hypertension Maternal Aunt     Social History:  Social History   Socioeconomic History  . Marital status: Divorced    Spouse name: Not on file  . Number of children: Not on file  . Years of education: Not on file  .  Highest education level: Not on file  Occupational History  . Not on file  Tobacco Use  . Smoking status: Never Smoker  . Smokeless tobacco: Never Used  Substance and Sexual Activity  . Alcohol use: No  . Drug use: No  . Sexual activity: Not Currently    Birth control/protection: Post-menopausal  Other Topics Concern  . Not on file  Social History Narrative  . Not on file   Social Determinants of Health   Financial Resource Strain:   . Difficulty of Paying Living Expenses: Not on file  Food Insecurity:   . Worried About Charity fundraiser in the Last Year: Not on  file  . Ran Out of Food in the Last Year: Not on file  Transportation Needs:   . Lack of Transportation (Medical): Not on file  . Lack of Transportation (Non-Medical): Not on file  Physical Activity:   . Days of Exercise per Week: Not on file  . Minutes of Exercise per Session: Not on file  Stress:   . Feeling of Stress : Not on file  Social Connections:   . Frequency of Communication with Friends and Family: Not on file  . Frequency of Social Gatherings with Friends and Family: Not on file  . Attends Religious Services: Not on file  . Active Member of Clubs or Organizations: Not on file  . Attends Archivist Meetings: Not on file  . Marital Status: Not on file    Allergies:  Allergies  Allergen Reactions  . Penicillins Rash    Metabolic Disorder Labs: No results found for: HGBA1C, MPG No results found for: PROLACTIN No results found for: CHOL, TRIG, HDL, CHOLHDL, VLDL, LDLCALC Lab Results  Component Value Date   TSH 4.68 (H) 05/30/2017    Therapeutic Level Labs: No results found for: LITHIUM Lab Results  Component Value Date   VALPROATE 49.5 (L) 06/25/2019   VALPROATE 48.6 (L) 12/04/2018   No components found for:  CBMZ  Current Medications: Current Outpatient Medications  Medication Sig Dispense Refill  . calcium citrate-vitamin D (CITRACAL+D) 315-200 MG-UNIT per tablet Take 2 tablets by mouth daily.     Derrill Memo ON 10/14/2019] divalproex (DEPAKOTE) 500 MG DR tablet Take 1 tablet (500 mg total) by mouth 2 (two) times daily. 180 tablet 0  . [START ON 10/14/2019] FLUoxetine (PROZAC) 40 MG capsule Take 1 capsule (40 mg total) by mouth daily. 90 capsule 0  . hydrochlorothiazide (HYDRODIURIL) 25 MG tablet Take 25 mg by mouth daily.    . hydrocortisone (ANUSOL-HC) 2.5 % rectal cream Place 1 application rectally 2 (two) times daily. 30 g 0  . ibuprofen (ADVIL,MOTRIN) 200 MG tablet Take 400 mg by mouth 2 (two) times daily as needed for headache.    .  levothyroxine (SYNTHROID) 50 MCG tablet     . miconazole (MICOTIN) 2 % cream Apply 1 application topically as needed.    . Multiple Vitamin (MULTIVITAMIN) tablet Take 1 tablet by mouth daily.    Derrill Memo ON 10/14/2019] traZODone (DESYREL) 50 MG tablet Take 0.5 tablets (25 mg total) by mouth at bedtime as needed for sleep. 90 tablet 0  . Wheat Dextrin (BENEFIBER DRINK MIX PO) Take by mouth.    Derrill Memo ON 10/13/2019] ziprasidone (GEODON) 40 MG capsule Take 1 capsule (40 mg total) by mouth 2 (two) times daily with a meal. 180 capsule 0   No current facility-administered medications for this visit.     Musculoskeletal: Strength & Muscle  Tone: N/A Gait & Station: N/A Patient leans: N/A  Psychiatric Specialty Exam: Review of Systems  Psychiatric/Behavioral: Negative for agitation, behavioral problems, confusion, decreased concentration, dysphoric mood, hallucinations, self-injury, sleep disturbance and suicidal ideas. The patient is not nervous/anxious and is not hyperactive.   All other systems reviewed and are negative.   Last menstrual period 03/18/2012.There is no height or weight on file to calculate BMI.  General Appearance: NA  Eye Contact:  NA  Speech:  Clear and Coherent  Volume:  Normal  Mood:  "good"  Affect:  NA  Thought Process:  Coherent  Orientation:  Full (Time, Place, and Person)  Thought Content: Logical   Suicidal Thoughts:  No  Homicidal Thoughts:  No  Memory:  Immediate;   Good  Judgement:  Good  Insight:  Good  Psychomotor Activity:  Normal  Concentration:  Concentration: Good and Attention Span: Good  Recall:  Good  Fund of Knowledge: Good  Language: Good  Akathisia:  No  Handed:  Right  AIMS (if indicated): not done  Assets:  Communication Skills Desire for Improvement  ADL's:  Intact  Cognition: WNL  Sleep:  Good   Screenings:   Assessment and Plan:  [name] [name] is a 59 y.o. year old female with a history of schizoaffective disorder,  hypertension , who presents for follow up appointment for Schizoaffective disorder, unspecified type ([place])  # Schizoaffective disorder She continues to demonstrate minimal thought process, and no significant psychotic symptoms since the last visit.  Noted that she did have circumstantial thought process with psychotic features, paranoia and occasional CAH at the initial visit.  Will continue ziprasidone to target  schizoaffective disorder.  Discussed potential metabolic side effects and QTC prolongation. Will continue fluoxetine to target depression. Will continue Depakote for mood dysregulation. Will continue trazodone prn for insomnia. Validated her grief of loss of her ex-husband.   Plan I have reviewed and updated plans as below 1.ContinueZiprasidone 40 mg twice day with meals (QTc 464 msec September 2020) 2.Continue fluoxetine 40 mg daily 3. ContinueDepakote500 mg twice a day(level48.6inMay) 4.Continuetrazodone 25-50 mg at night as needed for sleep(she declined refill) 5.Next appointment: 5/18 at 4:10 for 20 mins, phone 6. Reviewed VPA, CMP, Plt in Nov. Wnl except low VPA   Past trials of medication:Tofranil, Depakote Geodon, Prozac,  The patient demonstrates the following risk factors for suicide: Chronic risk factors for suicide include: psychiatric disorder of schizoaffective disorder. 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 Protective factorsfor this patient include: hope for the future. Considering these factors, the overall suicide risk at this point appears to be low.Patient isappropriate for outpatient follow up.  [name], MD 09/30/2019, 4:39 PM

## 2019-09-30 ENCOUNTER — Other Ambulatory Visit: Payer: Self-pay

## 2019-09-30 ENCOUNTER — Encounter (HOSPITAL_COMMUNITY): Payer: Self-pay | Admitting: Psychiatry

## 2019-09-30 ENCOUNTER — Ambulatory Visit (INDEPENDENT_AMBULATORY_CARE_PROVIDER_SITE_OTHER): Payer: Self-pay | Admitting: Psychiatry

## 2019-09-30 DIAGNOSIS — F259 Schizoaffective disorder, unspecified: Secondary | ICD-10-CM

## 2019-09-30 MED ORDER — TRAZODONE HCL 50 MG PO TABS: 25.0000 mg | ORAL_TABLET | Freq: Every evening | ORAL | 0 refills | Status: DC | PRN

## 2019-09-30 MED ORDER — ZIPRASIDONE HCL 40 MG PO CAPS: 40.0000 mg | ORAL_CAPSULE | Freq: Two times a day (BID) | ORAL | 0 refills | Status: DC

## 2019-09-30 MED ORDER — FLUOXETINE HCL 40 MG PO CAPS: 40.0000 mg | ORAL_CAPSULE | Freq: Every day | ORAL | 0 refills | Status: DC

## 2019-09-30 MED ORDER — DIVALPROEX SODIUM 500 MG PO DR TAB: 500.0000 mg | DELAYED_RELEASE_TABLET | Freq: Two times a day (BID) | ORAL | 0 refills | Status: DC

## 2019-09-30 NOTE — Patient Instructions (Signed)
1.ContinueZiprasidone 40 mg twice day with meals  2.Continue fluoxetine 40 mg daily 3. ContinueDepakote500 mg twice a day 4.Continuetrazodone 25-50 mg at night as needed for sleep 5.Next appointment: 5/18 at 4:10

## 2019-12-15 NOTE — Progress Notes (Signed)
Virtual Visit via Telephone Note  I connected with [name] [name] on 12/21/19 at  4:10 PM EDT by telephone and verified that I am speaking with the correct person using two identifiers.   I discussed the limitations, risks, security and privacy concerns of performing an evaluation and management service by telephone and the availability of in person appointments. I also discussed with the patient that there may be a patient responsible charge related to this service. The patient expressed understanding and agreed to proceed.     I discussed the assessment and treatment plan with the patient. The patient was provided an opportunity to ask questions and all were answered. The patient agreed with the plan and demonstrated an understanding of the instructions.   The patient was advised to call back or seek an in-person evaluation if the symptoms worsen or if the condition fails to improve as anticipated.  Interview was done at the following location.  Patient- in a car, Provider- home office   I provided 13 minutes of non-face-to-face time during this encounter.   [name], MD    [facility] MD/PA/NP OP Progress Note  12/21/2019 4:43 PM [name] [name]  MRN:  [id number]  Chief Complaint:  Chief Complaint    Follow-up; Other     HPI:  This is a follow-up appointment for schizoaffective disorder.  She apologized to contact late for the appointment. She then insists to have early appointment next time. She needed a few redirection to start the interview. She states that she is not doing well. She has not gone to the church for the past year. She feels like people are at church is against the patient. She also sates that she felt her supervisor was against her, although she later found out from other people that it was not correct. She denies other concern at work. She works five days a week. She feels down, thinking about her ex-husband, who died in September 24, 2022. They spent a lot of time together in  the past. She has hypersomnia. She feels fatigue. She denies SI, HI, AH, VH. She denies decreased need for sleep, euphoria. She denies ideas of reference.   Visit Diagnosis:    ICD-10-CM   1. Schizoaffective disorder, unspecified type ([place])  F25.9 Valproic Acid level    Comprehensive Metabolic Panel (CMET)    CBC    Past Psychiatric History: Please see initial evaluation for full details. I have reviewed the history. No updates at this time.     Past Medical History:  Past Medical History:  Diagnosis Date  . BV (bacterial vaginosis) 01/28/2013  . Essential hypertension, benign   . Hemorrhoids   . Obsessive-compulsive disorders   . Postmenopausal bleeding   . Schizoaffective disorder ([place])   . Tubular adenoma     Past Surgical History:  Procedure Laterality Date  . BUNIONECTOMY    . COLONOSCOPY N/A 07/05/2015   Dr.Rourk- internal hemorrhoids, redundant colon, colionic dierticulosis, colonic polyp. bx= tubular adenoma. next tcs 06/2020.   [name] HEMORRHOID BANDING  2017   Dr.Rourk    Family Psychiatric History: Please see initial evaluation for full details. I have reviewed the history. No updates at this time.     Family History:  Family History  Problem Relation Age of Onset  . Diabetes type II Maternal Grandmother   . Diabetes Maternal Grandmother   . Colon cancer Mother 76  . Hypertension Maternal Aunt     Social History:  Social History   Socioeconomic History  .  Marital status: Divorced    Spouse name: Not on file  . Number of children: Not on file  . Years of education: Not on file  . Highest education level: Not on file  Occupational History  . Not on file  Tobacco Use  . Smoking status: Never Smoker  . Smokeless tobacco: Never Used  Substance and Sexual Activity  . Alcohol use: No  . Drug use: No  . Sexual activity: Not Currently    Birth control/protection: Post-menopausal  Other Topics Concern  . Not on file  Social History Narrative  . Not on  file   Social Determinants of Health   Financial Resource Strain:   . Difficulty of Paying Living Expenses:   Food Insecurity:   . Worried About Charity fundraiser in the Last Year:   . Arboriculturist in the Last Year:   Transportation Needs:   . Film/video editor (Medical):   [name] Lack of Transportation (Non-Medical):   Physical Activity:   . Days of Exercise per Week:   . Minutes of Exercise per Session:   Stress:   . Feeling of Stress :   Social Connections:   . Frequency of Communication with Friends and Family:   . Frequency of Social Gatherings with Friends and Family:   . Attends Religious Services:   . Active Member of Clubs or Organizations:   . Attends Archivist Meetings:   [name] Marital Status:     Allergies:  Allergies  Allergen Reactions  . Penicillins Rash    Metabolic Disorder Labs: No results found for: HGBA1C, MPG No results found for: PROLACTIN No results found for: CHOL, TRIG, HDL, CHOLHDL, VLDL, LDLCALC Lab Results  Component Value Date   TSH 4.68 (H) 05/30/2017    Therapeutic Level Labs: No results found for: LITHIUM Lab Results  Component Value Date   VALPROATE 49.5 (L) 06/25/2019   VALPROATE 48.6 (L) 12/04/2018   No components found for:  CBMZ  Current Medications: Current Outpatient Medications  Medication Sig Dispense Refill  . calcium citrate-vitamin D (CITRACAL+D) 315-200 MG-UNIT per tablet Take 2 tablets by mouth daily.     . divalproex (DEPAKOTE) 500 MG DR tablet Take 1 tablet (500 mg total) by mouth 2 (two) times daily. 180 tablet 0  . FLUoxetine (PROZAC) 40 MG capsule Take 1 capsule (40 mg total) by mouth daily. 90 capsule 0  . hydrochlorothiazide (HYDRODIURIL) 25 MG tablet Take 25 mg by mouth daily.    . hydrocortisone (ANUSOL-HC) 2.5 % rectal cream Place 1 application rectally 2 (two) times daily. 30 g 0  . ibuprofen (ADVIL,MOTRIN) 200 MG tablet Take 400 mg by mouth 2 (two) times daily as needed for headache.    .  levothyroxine (SYNTHROID) 50 MCG tablet     . miconazole (MICOTIN) 2 % cream Apply 1 application topically as needed.    . Multiple Vitamin (MULTIVITAMIN) tablet Take 1 tablet by mouth daily.    . traZODone (DESYREL) 50 MG tablet Take 0.5 tablets (25 mg total) by mouth at bedtime as needed for sleep. 90 tablet 0  . Wheat Dextrin (BENEFIBER DRINK MIX PO) Take by mouth.    . ziprasidone (GEODON) 20 MG capsule 60 mg in PM (take along with 40 mg tab) 90 capsule 0  . ziprasidone (GEODON) 40 MG capsule 40 mg in AM, and 60 mg in PM (take along with 20 mg tab) 180 capsule 0   No current facility-administered medications for this  visit.     Musculoskeletal: Strength & Muscle Tone: N/A Gait & Station: N/A Patient leans: N/A  Psychiatric Specialty Exam: Review of Systems  Psychiatric/Behavioral: Positive for dysphoric mood. Negative for agitation, behavioral problems, confusion, decreased concentration, hallucinations, self-injury, sleep disturbance and suicidal ideas. The patient is not nervous/anxious and is not hyperactive.   All other systems reviewed and are negative.   Last menstrual period 03/18/2012.There is no height or weight on file to calculate BMI.  General Appearance: NA  Eye Contact:  NA  Speech:  Clear and Coherent  Volume:  Normal  Mood:  "not good"  Affect:  NA  Thought Process:  Coherent  Orientation:  Full (Time, Place, and Person)  Thought Content: Logical   Suicidal Thoughts:  No  Homicidal Thoughts:  No  Memory:  Immediate;   Good  Judgement:  Fair  Insight:  Fair  Psychomotor Activity:  Normal  Concentration:  Concentration: Good and Attention Span: Good  Recall:  Good  Fund of Knowledge: Good  Language: Good  Akathisia:  No  Handed:  Right  AIMS (if indicated): not done  Assets:  Communication Skills Desire for Improvement  ADL's:  Intact  Cognition: WNL  Sleep:  Good   Screenings:   Assessment and Plan:  [name] [name] is a 59 y.o. year old female  with a history of schizoaffective disorder, hypertension , who presents for follow up appointment for Schizoaffective disorder, unspecified type ([place]) - Plan: Valproic Acid level, Comprehensive Metabolic Panel (CMET), CBC  # Schizoaffective disorder Exam is notable for slight circumstantial in her thought process and irritability, although she is redirectable.  Noted that she did have significant circumstantial thought process with psychotic features, paranoia and occasional CAH at the initial visit.  Will titrate geodon target paranoia/schizoaffective disorder. Discussed potential metabolic side effect and QTC prolongation. We will continue fluoxetine to target depression. Will continue Depakote for mood dysregulation. We will continue trazodone as needed for insomnia.    Plan I have reviewed and updated plans as below 1.IncreaseZiprasidone 40 mg in AM and 60 mg at night (QTc 464 msec September 2020) 2.Continue fluoxetine 40 mg daily 3. ContinueDepakote500 mg twice a day(level48.6inMay) 4.Continuetrazodone 25-50 mg at night as needed for sleep 5.Next appointment:7/2 at 9:10 for 20 mins, phone 6. Check labs (Plt, CMP, VPA)  Past trials of medication:Tofranil, Depakote Geodon, Prozac,  The patient demonstrates the following risk factors for suicide: Chronic risk factors for suicide include: psychiatric disorder of schizoaffective disorder. 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 Protective factorsfor this patient include: hope for the future. Considering these factors, the overall suicide risk at this point appears to be low.Patient isappropriate for outpatient follow up.  [name], MD 12/21/2019, 4:43 PM

## 2019-12-21 ENCOUNTER — Other Ambulatory Visit: Payer: Self-pay

## 2019-12-21 ENCOUNTER — Telehealth (INDEPENDENT_AMBULATORY_CARE_PROVIDER_SITE_OTHER): Payer: Self-pay | Admitting: Psychiatry

## 2019-12-21 ENCOUNTER — Encounter (HOSPITAL_COMMUNITY): Payer: Self-pay | Admitting: Psychiatry

## 2019-12-21 DIAGNOSIS — F259 Schizoaffective disorder, unspecified: Secondary | ICD-10-CM

## 2019-12-21 MED ORDER — ZIPRASIDONE HCL 20 MG PO CAPS: ORAL_CAPSULE | ORAL | 0 refills | Status: DC

## 2019-12-21 MED ORDER — FLUOXETINE HCL 40 MG PO CAPS: 40.0000 mg | ORAL_CAPSULE | Freq: Every day | ORAL | 0 refills | Status: DC

## 2019-12-21 MED ORDER — DIVALPROEX SODIUM 500 MG PO DR TAB: 500.0000 mg | DELAYED_RELEASE_TABLET | Freq: Two times a day (BID) | ORAL | 0 refills | Status: DC

## 2019-12-21 MED ORDER — ZIPRASIDONE HCL 40 MG PO CAPS: ORAL_CAPSULE | ORAL | 0 refills | Status: DC

## 2019-12-21 NOTE — Addendum Note (Signed)
Addended by: [name] on: 12/21/2019 04:44 PM   Modules accepted: Orders

## 2019-12-29 ENCOUNTER — Telehealth (INDEPENDENT_AMBULATORY_CARE_PROVIDER_SITE_OTHER): Payer: Self-pay | Admitting: Psychiatry

## 2019-12-29 ENCOUNTER — Other Ambulatory Visit: Payer: Self-pay

## 2019-12-29 ENCOUNTER — Encounter (HOSPITAL_COMMUNITY): Payer: Self-pay | Admitting: Psychiatry

## 2019-12-29 ENCOUNTER — Telehealth (HOSPITAL_COMMUNITY): Payer: Self-pay | Admitting: *Deleted

## 2019-12-29 ENCOUNTER — Telehealth (HOSPITAL_COMMUNITY): Payer: Self-pay | Admitting: Psychiatry

## 2019-12-29 DIAGNOSIS — F259 Schizoaffective disorder, unspecified: Secondary | ICD-10-CM

## 2019-12-29 NOTE — Progress Notes (Signed)
Virtual Visit via Telephone Note  I connected with [name] [name] on 12/29/19 at  9:00 AM EDT by telephone and verified that I am speaking with the correct person using two identifiers.   I discussed the limitations, risks, security and privacy concerns of performing an evaluation and management service by telephone and the availability of in person appointments. I also discussed with the patient that there may be a patient responsible charge related to this service. The patient expressed understanding and agreed to proceed.    I discussed the assessment and treatment plan with the patient. The patient was provided an opportunity to ask questions and all were answered. The patient agreed with the plan and demonstrated an understanding of the instructions.   The patient was advised to call back or seek an in-person evaluation if the symptoms worsen or if the condition fails to improve as anticipated.  Location: patient- home, provider- office   I provided 15  minutes of non-face-to-face time during this encounter.   [name], MD    [facility] MD/PA/NP OP Progress Note  12/29/2019 9:33 AM [name] [name]  MRN:  [id number]  Chief Complaint:  Chief Complaint    Follow-up; Schizophrenia     HPI:  This is a follow-up appointment for schizoaffective disorder.  Although the patient was not able to tell the reason for making this appointment, she states that she has been on leave for 2 weeks from today.  She had to take a day off last Friday due to pain, and on Monday as she was upset. She was afraid that people at work in [place] were against her.  She talks about Freight forwarder, who she felt the vibration of being against her.  However, she also states that she knows they are not against the patient.  She does not have any issues working in [place]. She believes her mind is getting better since yesterday, and denies any concern about her medicaiton.  She feels depressed at times due to  grieving of loss of her ex-husband.  Although she reports passive SI, she denies any intent or plan.  She feels comfortable to reach out her cousin or emergency resources if needed.  She denies ideas of reference.  She denies insomnia.  She denies AH, VH.  She denies decreased need for sleep or euphoria.    [name] [name],  her [place], [place] presents to the interview with patient consent.  [name] [name] states that Ra was very agitated last week; she was fixated on things. She states that people are tormenting her, taking things from her house. [name] [name] is trying to contact the patient regularly. She verbalized her understanding of her diagnosis and her treatment. [name] [name] denies any safety concern.    Visit Diagnosis:    ICD-10-CM   1. Schizoaffective disorder, unspecified type ([place])  F25.9     Past Psychiatric History: Please see initial evaluation for full details. I have reviewed the history. No updates at this time.     Past Medical History:  Past Medical History:  Diagnosis Date  . BV (bacterial vaginosis) 01/28/2013  . Essential hypertension, benign   . Hemorrhoids   . Obsessive-compulsive disorders   . Postmenopausal bleeding   . Schizoaffective disorder ([place])   . Tubular adenoma     Past Surgical History:  Procedure Laterality Date  . BUNIONECTOMY    . COLONOSCOPY N/A 07/05/2015   Dr.Rourk- internal hemorrhoids, redundant colon, colionic dierticulosis, colonic polyp. bx= tubular adenoma. next tcs 06/2020.   [name]  HEMORRHOID BANDING  2017   Dr.Rourk    Family Psychiatric History: Please see initial evaluation for full details. I have reviewed the history. No updates at this time.     Family History:  Family History  Problem Relation Age of Onset  . Diabetes type II Maternal Grandmother   . Diabetes Maternal Grandmother   . Colon cancer Mother 9  . Hypertension Maternal Aunt     Social History:  Social History   Socioeconomic History  . Marital status: Divorced    Spouse name: Not  on file  . Number of children: Not on file  . Years of education: Not on file  . Highest education level: Not on file  Occupational History  . Not on file  Tobacco Use  . Smoking status: Never Smoker  . Smokeless tobacco: Never Used  Substance and Sexual Activity  . Alcohol use: No  . Drug use: No  . Sexual activity: Not Currently    Birth control/protection: Post-menopausal  Other Topics Concern  . Not on file  Social History Narrative  . Not on file   Social Determinants of Health   Financial Resource Strain:   . Difficulty of Paying Living Expenses:   Food Insecurity:   . Worried About Charity fundraiser in the Last Year:   . Arboriculturist in the Last Year:   Transportation Needs:   . Film/video editor (Medical):   [name] Lack of Transportation (Non-Medical):   Physical Activity:   . Days of Exercise per Week:   . Minutes of Exercise per Session:   Stress:   . Feeling of Stress :   Social Connections:   . Frequency of Communication with Friends and Family:   . Frequency of Social Gatherings with Friends and Family:   . Attends Religious Services:   . Active Member of Clubs or Organizations:   . Attends Archivist Meetings:   [name] Marital Status:     Allergies:  Allergies  Allergen Reactions  . Penicillins Rash    Metabolic Disorder Labs: No results found for: HGBA1C, MPG No results found for: PROLACTIN No results found for: CHOL, TRIG, HDL, CHOLHDL, VLDL, LDLCALC Lab Results  Component Value Date   TSH 4.68 (H) 05/30/2017    Therapeutic Level Labs: No results found for: LITHIUM Lab Results  Component Value Date   VALPROATE 49.5 (L) 06/25/2019   VALPROATE 48.6 (L) 12/04/2018   No components found for:  CBMZ  Current Medications: Current Outpatient Medications  Medication Sig Dispense Refill  . calcium citrate-vitamin D (CITRACAL+D) 315-200 MG-UNIT per tablet Take 2 tablets by mouth daily.     . divalproex (DEPAKOTE) 500 MG DR tablet  Take 1 tablet (500 mg total) by mouth 2 (two) times daily. 180 tablet 0  . FLUoxetine (PROZAC) 40 MG capsule Take 1 capsule (40 mg total) by mouth daily. 90 capsule 0  . hydrochlorothiazide (HYDRODIURIL) 25 MG tablet Take 25 mg by mouth daily.    . hydrocortisone (ANUSOL-HC) 2.5 % rectal cream Place 1 application rectally 2 (two) times daily. 30 g 0  . ibuprofen (ADVIL,MOTRIN) 200 MG tablet Take 400 mg by mouth 2 (two) times daily as needed for headache.    . levothyroxine (SYNTHROID) 50 MCG tablet     . miconazole (MICOTIN) 2 % cream Apply 1 application topically as needed.    . Multiple Vitamin (MULTIVITAMIN) tablet Take 1 tablet by mouth daily.    . traZODone (DESYREL) 50  MG tablet Take 0.5 tablets (25 mg total) by mouth at bedtime as needed for sleep. 90 tablet 0  . Wheat Dextrin (BENEFIBER DRINK MIX PO) Take by mouth.    . ziprasidone (GEODON) 20 MG capsule 60 mg in PM (take along with 40 mg tab) 90 capsule 0  . ziprasidone (GEODON) 40 MG capsule 40 mg in AM, and 60 mg in PM (take along with 20 mg tab) 180 capsule 0   No current facility-administered medications for this visit.     Musculoskeletal: Strength & Muscle Tone: N/A Gait & Station: N/A Patient leans: N/A  Psychiatric Specialty Exam: Review of Systems  Psychiatric/Behavioral: Positive for decreased concentration and dysphoric mood. Negative for agitation, confusion, hallucinations, self-injury, sleep disturbance and suicidal ideas. The patient is nervous/anxious. The patient is not hyperactive.   All other systems reviewed and are negative.   Last menstrual period 03/18/2012.There is no height or weight on file to calculate BMI.  General Appearance: NA  Eye Contact:  NA  Speech:  Clear and Coherent  Volume:  Normal  Mood:  fine  Affect:  NA  Thought Process:  Coherent  Orientation:  Full (Time, Place, and Person)  Thought Content: Paranoid Ideation   Suicidal Thoughts:  Yes.  without intent/plan  Homicidal  Thoughts:  No  Memory:  Immediate;   Good  Judgement:  Good  Insight:  Present  Psychomotor Activity:  Normal  Concentration:  Concentration: Fair and Attention Span: Fair  Recall:  Good  Fund of Knowledge: Good  Language: Good  Akathisia:  No  Handed:  Right  AIMS (if indicated): not done  Assets:  Communication Skills Desire for Improvement  ADL's:  Intact  Cognition: WNL  Sleep:  Good   Screenings:   Assessment and Plan:  [name] [name] is a 59 y.o. year old female with a history of schizoaffective disorder, hypertension, who presents for follow up appointment for below.   1. Schizoaffective disorder, unspecified type ([place]) Exam is notable for less irritability , and she reports improvement in paranoia since yesterday .  Given Geodon has been uptitrated last week, will continue current dose to target schizoaffective disorder.  Discussed potential metabolic side effect and QTC prolongation.  Will continue fluoxetine to target depression.  Will continue Depakote for mood dysregulation.  She is advised again to obtain the lab test for monitoring.   Plan I have reviewed and updated plans as below 1. ContinueZiprasidone 40 mg in AM and 60 mg at night (QTc 464 msec September 2020) 2.Continue fluoxetine 40 mg daily 3. ContinueDepakote500 mg twice a day(level48.6inMay) 4.Discontinue Trazodone 5.Keep next appointment:7/2 at 9:10 for 20 mins, phone - Obtain consent form to discuss with her cousin, [name] [name] 6.Check labs (Plt, CMP, VPA)  Emergency resources which includes 911, ED, suicide crisis line [phone]) are discussed.   Past trials of medication:Tofranil, Depakote Geodon, Prozac,   I have reviewed suicide assessment in detail. No change in the following assessment.   The patient demonstrates the following risk factors for suicide: Chronic risk factors for suicide include: psychiatric disorder of schizoaffective disorder. 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 Protective factorsfor this patient include: hope for the future. Considering these factors, the overall suicide risk at this point appears to be low.She denies gun access at home. Patient isappropriate for outpatient follow up.     [name], MD 12/29/2019, 9:33 AM

## 2019-12-29 NOTE — Telephone Encounter (Signed)
Patient would like to discuss with Provider

## 2019-12-29 NOTE — Telephone Encounter (Signed)
noted 

## 2019-12-29 NOTE — Telephone Encounter (Signed)
Contacted the patient. She cannot recall what her question was. She agrees to contact the office and relay the message to the staff/nurse when she remembers it.

## 2019-12-29 NOTE — Telephone Encounter (Signed)
Patient stated she forgot to ask provider something during there visit today and would like for provider to please call her back. [phone]

## 2019-12-29 NOTE — Telephone Encounter (Signed)
Please contact the patient and ask her concerns.

## 2019-12-30 ENCOUNTER — Telehealth (HOSPITAL_COMMUNITY): Payer: Self-pay | Admitting: *Deleted

## 2019-12-30 NOTE — Telephone Encounter (Signed)
Patient walked in and stated that the question she forgot to ask provider yesterday was if the provider recommends that she start counseling/therapy due to her issues she discussed with provider during her recent appointment.

## 2019-12-30 NOTE — Telephone Encounter (Signed)
I agree that it would be good to see a therapist. She used to see Ms. Bynum. Please ask Ms. Bynum first if she has any open slot to see this patient as follow up. If she has, advise the patient to have follow up with Ms.Bynum.

## 2019-12-30 NOTE — Telephone Encounter (Signed)
Spoke with patient and informed her with what provider stated and she verbalized understanding. Appt was made with patient and she agreed with appt.

## 2019-12-31 ENCOUNTER — Encounter (HOSPITAL_COMMUNITY): Payer: Self-pay | Admitting: Psychiatry

## 2019-12-31 LAB — COMPREHENSIVE METABOLIC PANEL
AG Ratio: 1.5 (calc) (ref 1.0–2.5)
ALT: 13 U/L (ref 6–29)
AST: 18 U/L (ref 10–35)
Albumin: 4 g/dL (ref 3.6–5.1)
Alkaline phosphatase (APISO): 54 U/L (ref 37–153)
BUN: 15 mg/dL (ref 7–25)
CO2: 30 mmol/L (ref 20–32)
Calcium: 9.4 mg/dL (ref 8.6–10.4)
Chloride: 103 mmol/L (ref 98–110)
Creat: 0.58 mg/dL (ref 0.50–1.05)
Globulin: 2.6 g/dL (calc) (ref 1.9–3.7)
Glucose, Bld: 107 mg/dL (ref 65–139)
Potassium: 4.2 mmol/L (ref 3.5–5.3)
Sodium: 139 mmol/L (ref 135–146)
Total Bilirubin: 0.3 mg/dL (ref 0.2–1.2)
Total Protein: 6.6 g/dL (ref 6.1–8.1)

## 2019-12-31 LAB — CBC
HCT: 40.7 % (ref 35.0–45.0)
Hemoglobin: 13.6 g/dL (ref 11.7–15.5)
MCH: 30.6 pg (ref 27.0–33.0)
MCHC: 33.4 g/dL (ref 32.0–36.0)
MCV: 91.7 fL (ref 80.0–100.0)
MPV: 9.7 fL (ref 7.5–12.5)
Platelets: 258 Thousand/uL (ref 140–400)
RBC: 4.44 Million/uL (ref 3.80–5.10)
RDW: 12.7 % (ref 11.0–15.0)
WBC: 6.7 Thousand/uL (ref 3.8–10.8)

## 2019-12-31 LAB — VALPROIC ACID LEVEL: Valproic Acid Lvl: 68.2 mg/L (ref 50.0–100.0)

## 2020-01-19 ENCOUNTER — Telehealth (HOSPITAL_COMMUNITY): Payer: Self-pay | Admitting: Psychiatry

## 2020-01-19 ENCOUNTER — Ambulatory Visit (INDEPENDENT_AMBULATORY_CARE_PROVIDER_SITE_OTHER): Payer: Self-pay | Admitting: Psychiatry

## 2020-01-19 ENCOUNTER — Other Ambulatory Visit: Payer: Self-pay

## 2020-01-19 DIAGNOSIS — F251 Schizoaffective disorder, depressive type: Secondary | ICD-10-CM

## 2020-01-20 ENCOUNTER — Telehealth (HOSPITAL_COMMUNITY): Payer: Self-pay | Admitting: *Deleted

## 2020-01-20 NOTE — Telephone Encounter (Signed)
LMOM for patient to call office to schedule 2 appts 2 weeks apart with Maurice Small. Office number provided on voicemail.
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
## 2020-01-21 NOTE — Telephone Encounter (Signed)
Opened in error

## 2020-02-02 NOTE — Progress Notes (Signed)
Virtual Visit via Telephone Note  I connected with [name] [name] on 02/11/20 at  8:30 AM EDT by telephone and verified that I am speaking with the correct person using two identifiers.   I discussed the limitations, risks, security and privacy concerns of performing an evaluation and management service by telephone and the availability of in person appointments. I also discussed with the patient that there may be a patient responsible charge related to this service. The patient expressed understanding and agreed to proceed.     I discussed the assessment and treatment plan with the patient. The patient was provided an opportunity to ask questions and all were answered. The patient agreed with the plan and demonstrated an understanding of the instructions.   The patient was advised to call back or seek an in-person evaluation if the symptoms worsen or if the condition fails to improve as anticipated.  Location: patient- home, provider- home office   I provided 12 minutes of non-face-to-face time during this encounter.   [name], MD    [facility] MD/PA/NP OP Progress Note  02/11/2020 8:37 AM [name] [name]  MRN:  [id number]  Chief Complaint:  Chief Complaint    Follow-up; Paranoid; Schizophrenia     HPI:  This is a follow-up appointment for schizophrenia.  She states that she made this appointment as she ran out of medication yesterday. She was accidentally taking Geodon 40 mg/80 mg instead of 40 mg/60 mg until last week, She states that she is doing well otherwise.  She has not gone to church as she feels that preacher and members there are against the patient.  She talks about an episode of her saying to one lady that the lady does not need to make up as her cheek are rose anyway. She was told by preacher that is an insult. Although she states that she should not say the way she did, she feels that she does not fit in the church, stating that she felt they are upper class. When she is  asked about the work whether there was any change in her position, she talks about the lady at work. She denies concern at work, stating that she was told by her boss not to worry about her work. She denies SI,HI, Ah, VH. She denies ideas of reference.   200 lbs Wt Readings from Last 3 Encounters:  03/10/19 200 lb (90.7 kg)  03/06/19 190 lb (86.2 kg)  09/03/18 195 lb (88.5 kg)    Visit Diagnosis: No diagnosis found.  Past Psychiatric History: Please see initial evaluation for full details. I have reviewed the history. No updates at this time.     Past Medical History:  Past Medical History:  Diagnosis Date  . BV (bacterial vaginosis) 01/28/2013  . Essential hypertension, benign   . Hemorrhoids   . Obsessive-compulsive disorders   . Postmenopausal bleeding   . Schizoaffective disorder ([place])   . Tubular adenoma     Past Surgical History:  Procedure Laterality Date  . BUNIONECTOMY    . COLONOSCOPY N/A 07/05/2015   Dr.Rourk- internal hemorrhoids, redundant colon, colionic dierticulosis, colonic polyp. bx= tubular adenoma. next tcs 06/2020.   [name] HEMORRHOID BANDING  2017   Dr.Rourk    Family Psychiatric History: Please see initial evaluation for full details. I have reviewed the history. No updates at this time.     Family History:  Family History  Problem Relation Age of Onset  . Diabetes type II Maternal Grandmother   .  Diabetes Maternal Grandmother   . Colon cancer Mother 46  . Hypertension Maternal Aunt     Social History:  Social History   Socioeconomic History  . Marital status: Divorced    Spouse name: Not on file  . Number of children: Not on file  . Years of education: Not on file  . Highest education level: Not on file  Occupational History  . Not on file  Tobacco Use  . Smoking status: Never Smoker  . Smokeless tobacco: Never Used  Substance and Sexual Activity  . Alcohol use: No  . Drug use: No  . Sexual activity: Not Currently    Birth  control/protection: Post-menopausal  Other Topics Concern  . Not on file  Social History Narrative  . Not on file   Social Determinants of Health   Financial Resource Strain:   . Difficulty of Paying Living Expenses:   Food Insecurity:   . Worried About Charity fundraiser in the Last Year:   . Arboriculturist in the Last Year:   Transportation Needs:   . Film/video editor (Medical):   [name] Lack of Transportation (Non-Medical):   Physical Activity:   . Days of Exercise per Week:   . Minutes of Exercise per Session:   Stress:   . Feeling of Stress :   Social Connections:   . Frequency of Communication with Friends and Family:   . Frequency of Social Gatherings with Friends and Family:   . Attends Religious Services:   . Active Member of Clubs or Organizations:   . Attends Archivist Meetings:   [name] Marital Status:     Allergies:  Allergies  Allergen Reactions  . Penicillins Rash    Metabolic Disorder Labs: No results found for: HGBA1C, MPG No results found for: PROLACTIN No results found for: CHOL, TRIG, HDL, CHOLHDL, VLDL, LDLCALC Lab Results  Component Value Date   TSH 4.68 (H) 05/30/2017    Therapeutic Level Labs: No results found for: LITHIUM Lab Results  Component Value Date   VALPROATE 68.2 12/30/2019   VALPROATE 49.5 (L) 06/25/2019   No components found for:  CBMZ  Current Medications: Current Outpatient Medications  Medication Sig Dispense Refill  . calcium citrate-vitamin D (CITRACAL+D) 315-200 MG-UNIT per tablet Take 2 tablets by mouth daily.     . divalproex (DEPAKOTE) 500 MG DR tablet Take 1 tablet (500 mg total) by mouth 2 (two) times daily. 180 tablet 0  . FLUoxetine (PROZAC) 40 MG capsule Take 1 capsule (40 mg total) by mouth daily. 90 capsule 0  . hydrochlorothiazide (HYDRODIURIL) 25 MG tablet Take 25 mg by mouth daily.    . hydrocortisone (ANUSOL-HC) 2.5 % rectal cream Place 1 application rectally 2 (two) times daily. (Patient not  taking: Reported on 01/19/2020) 30 g 0  . ibuprofen (ADVIL,MOTRIN) 200 MG tablet Take 400 mg by mouth 2 (two) times daily as needed for headache.    . levothyroxine (SYNTHROID) 50 MCG tablet     . miconazole (MICOTIN) 2 % cream Apply 1 application topically as needed. (Patient not taking: Reported on 01/19/2020)    . Multiple Vitamin (MULTIVITAMIN) tablet Take 1 tablet by mouth daily. (Patient not taking: Reported on 01/19/2020)    . traZODone (DESYREL) 50 MG tablet Take 0.5 tablets (25 mg total) by mouth at bedtime as needed for sleep. 90 tablet 0  . Wheat Dextrin (BENEFIBER DRINK MIX PO) Take by mouth. (Patient not taking: Reported on 01/19/2020)    .  ziprasidone (GEODON) 20 MG capsule 60 mg in PM (take along with 40 mg tab) 90 capsule 0  . ziprasidone (GEODON) 40 MG capsule 40 mg in AM, and 60 mg in PM (take along with 20 mg tab) 180 capsule 0   No current facility-administered medications for this visit.     Musculoskeletal: Strength & Muscle Tone: N/A Gait & Station: N/A Patient leans: N/A  Psychiatric Specialty Exam: Review of Systems  Psychiatric/Behavioral: Negative for agitation, behavioral problems, confusion, decreased concentration, dysphoric mood, hallucinations, self-injury, sleep disturbance and suicidal ideas. The patient is not nervous/anxious and is not hyperactive.   All other systems reviewed and are negative.   Last menstrual period 03/18/2012.There is no height or weight on file to calculate BMI.  General Appearance: NA  Eye Contact:  NA  Speech:  Clear and Coherent  Volume:  Normal  Mood:  good  Affect:  NA  Thought Process:  Coherent and Descriptions of Associations: Tangential  Orientation:  Full (Time, Place, and Person)  Thought Content: Logical   Suicidal Thoughts:  No  Homicidal Thoughts:  No  Memory:  Immediate;   Good  Judgement:  Good  Insight:  Shallow  Psychomotor Activity:  Normal  Concentration:  Concentration: Good and Attention Span: Good   Recall:  Good  Fund of Knowledge: Good  Language: Good  Akathisia:  No  Handed:  Right  AIMS (if indicated): not done  Assets:  Communication Skills Desire for Improvement  ADL's:  Intact  Cognition: WNL  Sleep:  Good   Screenings:   Assessment and Plan:  [name] [name] is a 59 y.o. year old female with a history of  schizoaffective disorder, hypertension, who presents for follow up appointment for below.   1. Schizoaffective disorder, depressive type ([place]) Exam is notable for tangential thought process, and she complains of paranoia, although it has been improving since up titration of Geodon.  Will do further up titration of Geodon to optimize treatment for schizoaffective disorder.  Discussed potential metabolic side effect and QTC prolongation.  Will continue fluoxetine to target depression.  We will continue Depakote for mood dysregulation.   Plan I have reviewed and updated plans as below 1. Increase Ziprasidone 40 mgin AM and 60 mg at night(QTc 464 msec September 2020) 2.Continue fluoxetine 40 mg daily 3. ContinueDepakote500 mg twice a day(level48.6inMay) 4.Next appointment:9/10 at 9:65for 20 mins, phone - Obtain consent form to discuss with her cousin, Hilda Blades 6.labs received - wnl (Plt, CMP, VPA) 12/2019   Past trials of medication:Tofranil, Depakote Geodon, Prozac,   The patient demonstrates the following risk factors for suicide: Chronic risk factors for suicide include: psychiatric disorder of schizoaffective disorder. 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 Protective factorsfor this patient include: hope for the future. Considering these factors, the overall suicide risk at this point appears to be low.She denies gun access at home. Patient isappropriate for outpatient follow up. [name], MD 02/11/2020, 8:37 AM

## 2020-02-04 ENCOUNTER — Telehealth (HOSPITAL_COMMUNITY): Payer: Self-pay | Admitting: Psychiatry

## 2020-02-09 ENCOUNTER — Ambulatory Visit: Payer: Self-pay | Admitting: Gastroenterology

## 2020-02-11 ENCOUNTER — Encounter (HOSPITAL_COMMUNITY): Payer: Self-pay | Admitting: Psychiatry

## 2020-02-11 ENCOUNTER — Telehealth (INDEPENDENT_AMBULATORY_CARE_PROVIDER_SITE_OTHER): Payer: Self-pay | Admitting: Psychiatry

## 2020-02-11 ENCOUNTER — Other Ambulatory Visit: Payer: Self-pay

## 2020-02-11 DIAGNOSIS — F251 Schizoaffective disorder, depressive type: Secondary | ICD-10-CM

## 2020-02-11 MED ORDER — ZIPRASIDONE HCL 40 MG PO CAPS: ORAL_CAPSULE | ORAL | 0 refills | Status: DC

## 2020-02-11 MED ORDER — FLUOXETINE HCL 40 MG PO CAPS: 40.0000 mg | ORAL_CAPSULE | Freq: Every day | ORAL | 0 refills | Status: DC

## 2020-02-11 MED ORDER — DIVALPROEX SODIUM 500 MG PO DR TAB: 500.0000 mg | DELAYED_RELEASE_TABLET | Freq: Two times a day (BID) | ORAL | 0 refills | Status: DC

## 2020-02-11 MED ORDER — ZIPRASIDONE HCL 80 MG PO CAPS: ORAL_CAPSULE | ORAL | 0 refills | Status: DC

## 2020-02-11 NOTE — Patient Instructions (Signed)
1. Increase Ziprasidone 40 mgin AM and 60 mg at night 2.Continue fluoxetine 40 mg daily 3. ContinueDepakote500 mg twice a day(level48.6inMay) 4.Next appointment:9/10 at 61:10

## 2020-02-15 ENCOUNTER — Other Ambulatory Visit: Payer: Self-pay

## 2020-02-15 ENCOUNTER — Ambulatory Visit (INDEPENDENT_AMBULATORY_CARE_PROVIDER_SITE_OTHER): Payer: Self-pay | Admitting: Psychiatry

## 2020-02-15 DIAGNOSIS — F251 Schizoaffective disorder, depressive type: Secondary | ICD-10-CM

## 2020-02-15 NOTE — Progress Notes (Signed)
Virtual Visit via Telephone Note  I connected with [name] on 02/15/20 at 4:15 PM EDT by telephone and verified that I am speaking with the correct person using two identifiers.   I discussed the limitations, risks, security and privacy concerns of performing an evaluation and management service by telephone and the availability of in person appointments. I also discussed with the patient that there may be a patient responsible charge related to this service. The patient expressed understanding and agreed to proceed.   I provided 40 minutes of non-face-to-face time during this encounter.   [name] Smoker, LCSW    THERAPIST PROGRESS NOTE    Location:  Patient - Car / Provider - [facility] [place] office   Session Time: Tuesday 02/15/2020 4:15 PM - 4:55 PM   Participation Level: Active  Behavioral Response: alert, depressed, anxious  Type of Therapy: Individual Therapy  Treatment Goals addressed: Patient wants to have someone to talk to process grief and loss issues related to the death of her ex- husband in January/ alleviate depression  Interventions: Supportive  Summary: [name] [name] is a 59 y.o. female who is a returning patient to this clinician. She  presents with a long-standing history of symptoms of anxiety and depression along with a previous diagnosis of schizoaffective disorder. She  reports a history of at least 3 psychiatric hospitalizations from 23 through 1990.  She has received outpatient services from [facility] and [facility]. She is resuming services in this practice due to grief and loss issues related to death of ex- husband in Sep 03, 2019. Current symptoms include depressed mood, worry, tearfulness, irritability, and fatigue.  Patient last was seen via virtual visit about 2 weeks ago.  She continues to report tearfulness, depressed mood, and worry.  She expresses guilt about not going to see ex-husband the night before he died.  She also  expresses regret that she did not get back together with him when he asked her to do so in December of last year.  She expresses anger and frustration his brothers did not solicit input from her regarding taking care of his remains.  He was cremated and there was no type of service for him per patient's report.  Patient has had increased thoughts about church and has suspicions that the preacher does not like her and that he considers himself spiritually superior to her although there is no evidence of this.  She reports additional stress related to acquaintance that has been asking patient to take him various places for the past 2 weeks.  She also worries about ongoing issues with her renter who has a pattern of being delinquent on rent payments.   Suicidal/Homicidal: Nowithout intent/plan  Therapist Response: Reestablish rapport, reviewed symptoms, facilitated patient sharing narrative of ex-husband's death, facilitated patient expressing thoughts and feelings, validated and normalized feelings related to grief and loss process, assisted patient identify and replace thoughts evoking inappropriate guilt, will send patient grief coping cards and developed plan with patient to read 1 daily, reviewed assertiveness skills to assist patient set and maintain limits with acquaintance, also discussed with patient her resources to manage issues with mother including her attorney  Plan: Return again in 2 weeks.  Diagnosis: Axis I: Schizoaffective Disorder        [name] Smoker, LCSW 02/15/2020

## 2020-02-20 ENCOUNTER — Emergency Department (HOSPITAL_COMMUNITY)
Admission: EM | Admit: 2020-02-20 | Discharge: 2020-02-20 | Disposition: A | Discharge status: Home / Self Care | Payer: 59 | Attending: Emergency Medicine | Admitting: Emergency Medicine

## 2020-02-20 ENCOUNTER — Other Ambulatory Visit: Payer: Self-pay

## 2020-02-20 ENCOUNTER — Encounter (HOSPITAL_COMMUNITY): Payer: Self-pay | Admitting: Emergency Medicine

## 2020-02-20 ENCOUNTER — Emergency Department (HOSPITAL_COMMUNITY): Payer: 59

## 2020-02-20 DIAGNOSIS — Z20822 Contact with and (suspected) exposure to covid-19: Secondary | ICD-10-CM | POA: Diagnosis not present

## 2020-02-20 DIAGNOSIS — R6883 Chills (without fever): Secondary | ICD-10-CM | POA: Insufficient documentation

## 2020-02-20 DIAGNOSIS — I10 Essential (primary) hypertension: Secondary | ICD-10-CM | POA: Insufficient documentation

## 2020-02-20 DIAGNOSIS — R519 Headache, unspecified: Secondary | ICD-10-CM

## 2020-02-20 DIAGNOSIS — G44221 Chronic tension-type headache, intractable: Secondary | ICD-10-CM | POA: Insufficient documentation

## 2020-02-20 LAB — SARS CORONAVIRUS 2 BY RT PCR (HOSPITAL ORDER, PERFORMED IN ~~LOC~~ HOSPITAL LAB): SARS Coronavirus 2: NEGATIVE

## 2020-02-20 MED ORDER — METOCLOPRAMIDE HCL 5 MG/ML IJ SOLN
10.0000 mg | Freq: Once | INTRAMUSCULAR | Status: AC
  Administered 2020-02-20: 10 mg via INTRAMUSCULAR
  Filled 2020-02-20: qty 2

## 2020-02-20 MED ORDER — DIPHENHYDRAMINE HCL 50 MG/ML IJ SOLN
25.0000 mg | Freq: Once | INTRAMUSCULAR | Status: AC
  Administered 2020-02-20: 25 mg via INTRAMUSCULAR
  Filled 2020-02-20: qty 1

## 2020-02-20 MED ORDER — KETOROLAC TROMETHAMINE 30 MG/ML IJ SOLN
30.0000 mg | Freq: Once | INTRAMUSCULAR | Status: AC
  Administered 2020-02-20: 30 mg via INTRAMUSCULAR
  Filled 2020-02-20: qty 1

## 2020-02-20 NOTE — ED Provider Notes (Signed)
[facility] EMERGENCY DEPARTMENT Provider Note   CSN: [id number] Arrival date & time: 02/20/20  0401     History Chief Complaint  Patient presents with  . Headache    [name] [name] is a 59 y.o. female.  Patient with history of hypertension, schizoaffective disorder presenting with a 2-day history of frontal headache that she describes as "tension headache".  States the headache is gradual in onset and comes and goes.  She been taking ibuprofen which does help but the headache does return.  States she was woken up by the headache tonight last ibuprofen about 1 hour ago.  She denies any nausea or vomiting.  No photophobia or phonophobia.  No focal weakness, numbness or tingling.  No fever but she has had chills.  No neck pain or back pain.  States she was around somebody with a fever several days ago.  Denies any aches elsewhere in her body.  Did receive both Covid vaccines.  She was recently started on p.o. Geodon which she believes may be contributing to her headaches. Has never been diagnosed with tension headaches or migraine headaches in the past.  States she is concerned about taking ibuprofen frequently for this headache.  Denies any falls or trauma.  Denies any blood thinner use.  Does have mild congestion and runny nose.  No cough, chest pain or shortness of breath.  Denies thunderclap onset.  Denies the worst headache she is ever had.  The history is provided by the patient.  Headache Associated symptoms: no abdominal pain, no congestion, no cough, no dizziness, no fatigue, no fever, no myalgias, no nausea, no neck pain, no photophobia, no vomiting and no weakness        Past Medical History:  Diagnosis Date  . BV (bacterial vaginosis) 01/28/2013  . Essential hypertension, benign   . Hemorrhoids   . Obsessive-compulsive disorders   . Postmenopausal bleeding   . Schizoaffective disorder ([place])   . Tubular adenoma     Patient Active Problem List   Diagnosis Date Noted  .  Closed nondisplaced fracture of head of radius with routine healing 03/30/2019  . Schizoaffective disorder, depressive type ([place]) 05/08/2017  . History of colonic polyps   . Diverticulosis of colon without hemorrhage   . Hemorrhoid   . Rectal bleeding 06/21/2015  . Family hx of colon cancer 06/21/2015  . Postmenopausal bleeding 03/18/2013  . BV (bacterial vaginosis) 01/28/2013  . Murmur, cardiac 06/02/2012  . Syncope 06/02/2012  . Essential hypertension, benign 06/02/2012  . Prolonged Q-T interval on ECG 06/02/2012    Past Surgical History:  Procedure Laterality Date  . BUNIONECTOMY    . COLONOSCOPY N/A 07/05/2015   Dr.Rourk- internal hemorrhoids, redundant colon, colionic dierticulosis, colonic polyp. bx= tubular adenoma. next tcs 06/2020.   [name] HEMORRHOID BANDING  2017   Dr.Rourk     OB History    Gravida  1   Para      Term      Preterm      AB  1   Living        SAB  1   TAB      Ectopic      Multiple      Live Births              Family History  Problem Relation Age of Onset  . Diabetes type II Maternal Grandmother   . Diabetes Maternal Grandmother   . Colon cancer Mother 79  . Hypertension Maternal  Aunt     Social History   Tobacco Use  . Smoking status: Never Smoker  . Smokeless tobacco: Never Used  Substance Use Topics  . Alcohol use: No  . Drug use: No    Home Medications Prior to Admission medications   Medication Sig Start Date End Date Taking? Authorizing Provider  calcium citrate-vitamin D (CITRACAL+D) 315-200 MG-UNIT per tablet Take 2 tablets by mouth daily.     [provider]  divalproex (DEPAKOTE) 500 MG DR tablet Take 1 tablet (500 mg total) by mouth 2 (two) times daily. 03/20/20   [name], MD  FLUoxetine (PROZAC) 40 MG capsule Take 1 capsule (40 mg total) by mouth daily. 03/20/20   [name], MD  hydrochlorothiazide (HYDRODIURIL) 25 MG tablet Take 25 mg by mouth daily.    [provider]    hydrocortisone (ANUSOL-HC) 2.5 % rectal cream Place 1 application rectally 2 (two) times daily. Patient not taking: Reported on 01/19/2020 09/05/16   Mahala Menghini, PA-C  ibuprofen (ADVIL,MOTRIN) 200 MG tablet Take 400 mg by mouth 2 (two) times daily as needed for headache.    [provider]  levothyroxine (SYNTHROID) 50 MCG tablet  02/13/19   [provider]  miconazole (MICOTIN) 2 % cream Apply 1 application topically as needed. Patient not taking: Reported on 01/19/2020    [provider]  Multiple Vitamin (MULTIVITAMIN) tablet Take 1 tablet by mouth daily. Patient not taking: Reported on 01/19/2020    [provider]  traZODone (DESYREL) 50 MG tablet Take 0.5 tablets (25 mg total) by mouth at bedtime as needed for sleep. 10/14/19   [name], MD  Wheat Dextrin (BENEFIBER DRINK MIX PO) Take by mouth. Patient not taking: Reported on 01/19/2020    [provider]  ziprasidone (GEODON) 40 MG capsule 40 mg in AM, 80 mg in PM , take along with 80 mg cap 02/11/20   [name], [name], MD  ziprasidone (GEODON) 80 MG capsule 40 mg in AM, 80 mg in PM , take along with 40 mg cap 02/11/20   [name], MD    Allergies    Penicillins  Review of Systems   Review of Systems  Constitutional: Positive for chills. Negative for activity change, appetite change, fatigue and fever.  HENT: Negative for congestion and rhinorrhea.   Eyes: Negative for photophobia and visual disturbance.  Respiratory: Negative for cough, chest tightness and shortness of breath.   Cardiovascular: Negative for chest pain.  Gastrointestinal: Negative for abdominal pain, nausea and vomiting.  Genitourinary: Negative for dysuria and hematuria.  Musculoskeletal: Negative for arthralgias, myalgias and neck pain.  Skin: Negative for wound.  Neurological: Positive for headaches. Negative for dizziness and weakness.   all other systems are negative except as noted in the HPI and PMH.     Physical Exam Updated Vital Signs BP 127/63   Pulse 85   Temp 99 F (37.2 C)   Resp 18   Ht 5\' 4"  (1.626 m)   Wt 90.7 kg   LMP 03/18/2012   SpO2 95%   BMI 34.33 kg/m   Physical Exam Vitals and nursing note reviewed.  Constitutional:      General: She is not in acute distress.    Appearance: She is well-developed.     Comments: Well-appearing, smiling  HENT:     Head: Normocephalic and atraumatic.     Mouth/Throat:     Pharynx: No oropharyngeal exudate.  Eyes:     Conjunctiva/sclera: Conjunctivae normal.  Pupils: Pupils are equal, round, and reactive to light.  Neck:     Comments: No meningismus. FROM, supple, able to touch chin to chest.  Cardiovascular:     Rate and Rhythm: Normal rate and regular rhythm.     Heart sounds: Normal heart sounds. No murmur heard.   Pulmonary:     Effort: Pulmonary effort is normal. No respiratory distress.     Breath sounds: Normal breath sounds.  Abdominal:     Palpations: Abdomen is soft.     Tenderness: There is no abdominal tenderness. There is no guarding or rebound.  Musculoskeletal:        General: No tenderness. Normal range of motion.     Cervical back: Normal range of motion and neck supple. No rigidity.  Lymphadenopathy:     Cervical: No cervical adenopathy.  Skin:    General: Skin is warm.     Capillary Refill: Capillary refill takes less than 2 seconds.  Neurological:     Mental Status: She is alert and oriented to person, place, and time.     Cranial Nerves: No cranial nerve deficit.     Motor: No abnormal muscle tone.     Coordination: Coordination normal.     Comments: CN 2-12 intact, no ataxia on finger to nose, no nystagmus, 5/5 strength throughout, no pronator drift, Romberg negative, normal gait.   Psychiatric:        Behavior: Behavior normal.     ED Results / Procedures / Treatments   Labs (all labs ordered are listed, but only abnormal results are displayed) Labs Reviewed  SARS CORONAVIRUS  2 BY RT PCR ([place], [facility])    EKG None  Radiology CT Head Wo Contrast  Result Date: 02/20/2020 CLINICAL DATA:  Initial evaluation for acute headache. EXAM: CT HEAD WITHOUT CONTRAST TECHNIQUE: Contiguous axial images were obtained from the base of the skull through the vertex without intravenous contrast. COMPARISON:  None. FINDINGS: Brain: Mild age-related cerebral atrophy with chronic small vessel ischemic disease. No evidence for acute intracranial hemorrhage. No findings to suggest acute large vessel territory infarct. No mass lesion, midline shift, or mass effect. Ventricles are normal in size without evidence for hydrocephalus. No extra-axial fluid collection identified. Vascular: No hyperdense vessel identified. Skull: Scalp soft tissues demonstrate no acute abnormality. Calvarium intact. Sinuses/Orbits: Globes and orbital soft tissues within normal limits. Visualized paranasal sinuses are clear. No mastoid effusion. IMPRESSION: 1. No acute intracranial abnormality. 2. Mild age-related cerebral atrophy with chronic small vessel ischemic disease. Electronically Signed   By: Jeannine Boga M.D.   On: 02/20/2020 05:53    Procedures Procedures (including critical care time)  Medications Ordered in ED Medications  ketorolac (TORADOL) 30 MG/ML injection 30 mg (has no administration in time range)  metoCLOPramide (REGLAN) injection 10 mg (has no administration in time range)  diphenhydrAMINE (BENADRYL) injection 25 mg (has no administration in time range)    ED Course  I have reviewed the triage vital signs and the nursing notes.  Pertinent labs & imaging results that were available during my care of the patient were reviewed by me and considered in my medical decision making (see chart for details).    MDM Rules/Calculators/A&P                         Gradual onset headache for the past 2 days.  No thunderclap onset.  No focal neurological  deficit.  No  meningismus.  Low suspicion for subarachnoid hemorrhage, bacterial meningitis, or temporal arteritis. Patient with full range of motion of her neck without difficulty.  Able to touch her skin chin to her chest. Recheck temperature is 98.7.  CT head is negative.  Headache has resolved after Toradol, Reglan, and Benadryl.  Patient is smiling and states she feels fine.  She is able to ambulate and is drinking without difficulty.  We will treat supportively with oral hydration, Tylenol and Motrin at home.  Low suspicion for subarachnoid hemorrhage, bacterial meningitis or temporal arteritis at this time. Covid test is pending.  Discussed with patient PCP follow-up.  Return to the ED with worsening pain, unilateral weakness, numbness, tingling, fever, confusion, difficulty speaking, difficulty swallowing or any other concerns.   [name] [name] was evaluated in Emergency Department on 02/20/2020 for the symptoms described in the history of present illness. She was evaluated in the context of the global COVID-19 pandemic, which necessitated consideration that the patient might be at risk for infection with the SARS-CoV-2 virus that causes COVID-19. Institutional protocols and algorithms that pertain to the evaluation of patients at risk for COVID-19 are in a state of rapid change based on information released by regulatory bodies including the CDC and federal and state organizations. These policies and algorithms were followed during the patient's care in the ED.  Final Clinical Impression(s) / ED Diagnoses Final diagnoses:  Acute nonintractable headache, unspecified headache type    Rx / DC Orders ED Discharge Orders    None       Dashonna Chagnon, Annie Main, MD 02/20/20 [id number]

## 2020-02-20 NOTE — ED Triage Notes (Signed)
Pt C/O headache that started Friday morning. Pt has been taking ibuprofen with no relief.

## 2020-02-20 NOTE — Discharge Instructions (Signed)
Keep yourself hydrated.  You may check your Covid test online later today.  You may alternate Motrin and Tylenol as needed for headache every 3 hours.  Follow-up with your primary doctor.  Return to the ED if you develop worsening headache, fever, difficulty moving your head or neck, unilateral weakness, numbness, tingling, difficulty speaking, confusion, or any other concerns.

## 2020-02-20 NOTE — ED Notes (Signed)
Pt ambulated without assistance. Pt did very well walking by herself. Pt stated she feels fine. She states she is tired from not sleeping. Offered water and pt drank without problems. Does not complain of nausea.

## 2020-02-21 ENCOUNTER — Telehealth (HOSPITAL_COMMUNITY): Payer: Self-pay | Admitting: *Deleted

## 2020-02-21 NOTE — Telephone Encounter (Signed)
Per pt she do not know if its Trazodone or what but she keeps having headache. Per pt, she started the Trazodone and she is still having headaches since then. Per pt she is very confused as to what to do with her medications and feels like she is taking way too many medicine. Pt number is [phone] is her home number.

## 2020-02-21 NOTE — Telephone Encounter (Signed)
I have not prescribed trazodone since March (ordered refill for 3 months). Having said that, if she thinks that is coming from Trazodone, advise her to discontinue medication to see if it helps. I would recommend she follows up with her primary care (which was also advised at ED visit according to the chart) for further evaluation.

## 2020-02-21 NOTE — Telephone Encounter (Signed)
Informed patient with what provider stated and she verbalized understanding.  

## 2020-02-21 NOTE — Telephone Encounter (Signed)
Per pt she just want to correct the name of the medication that is causing her to have headaches. Per pt it is the ziprasidone (GEODON) and she just want Dr. Modesta Messing to know and wants to know what to do. Call on cell [phone].

## 2020-02-21 NOTE — Telephone Encounter (Signed)
Informed patient with information and she verbalized understanding.  

## 2020-02-21 NOTE — Telephone Encounter (Signed)
Advise her to reduce the dose of Geodon 40 mg twice a day to see if it helps. Advise her to be seen by her primary care if her symptoms does not improve despite this change.

## 2020-02-21 NOTE — Telephone Encounter (Signed)
Opened in Error.

## 2020-02-28 ENCOUNTER — Ambulatory Visit (INDEPENDENT_AMBULATORY_CARE_PROVIDER_SITE_OTHER): Payer: 59 | Admitting: Psychiatry

## 2020-02-28 ENCOUNTER — Other Ambulatory Visit: Payer: Self-pay

## 2020-02-28 DIAGNOSIS — F251 Schizoaffective disorder, depressive type: Secondary | ICD-10-CM | POA: Diagnosis not present

## 2020-02-28 NOTE — Progress Notes (Signed)
Virtual Visit via Telephone Note  I connected with [name] on 02/28/20 at 4:10 PM EDT  by telephone and verified that I am speaking with the correct person using two identifiers.   I discussed the limitations, risks, security and privacy concerns of performing an evaluation and management service by telephone and the availability of in person appointments. I also discussed with the patient that there may be a patient responsible charge related to this service. The patient expressed understanding and agreed to proceed.  I provided 42 minutes of non-face-to-face time during this encounter.   [name] Smoker, LCSW     THERAPIST PROGRESS NOTE    Location:  Patient - Car / Provider - [facility] Outpatient [place] office   Session Time: Monday 02/28/2020 4:10 PM - 4:52 PM      Participation Level: Active  Behavioral Response: alert, depressed, anxious  Type of Therapy: Individual Therapy  Treatment Goals addressed: Patient wants to have someone to talk to process grief and loss issues related to the death of her ex- husband in January/ alleviate depression  Interventions: Supportive  Summary: [name] [name] is a 59 y.o. female who is a returning patient to this clinician. She  presents with a long-standing history of symptoms of anxiety and depression along with a previous diagnosis of schizoaffective disorder. She  reports a history of at least 3 psychiatric hospitalizations from 39 through 1990.  She has received outpatient services from [facility] and [facility]. She is resuming services in this practice due to grief and loss issues related to death of ex- husband in 09-27-19. Current symptoms include depressed mood, worry, tearfulness, irritability, and fatigue.  Patient last was seen via virtual visit about 2 weeks ago.  She reports loneliness,  depressed mood, and worry.  She continues to miss ex-husband but expresses less guilt.  She is trying to stay involved in  activities including working and reports attending church where her aunt attends yesterday.  Patient reports enjoying the service.  She expresses worry about her job as coworkers have been doing parts of her job when she has not done the job or has not done it properly.  Patient reports suspicions that one of her coworkers wants her job.  Patient fears losing her job.  Patient is pleased she set limits with an acquaintance who used to ask patient to take him various places.  She continues to have concerns about renter. She now is looking for another attorney to assist her with legal issues as the one she wanted to originally has no openings for new clients until October.   Suicidal/Homicidal: Nowithout intent/plan  Therapist Response: Reestablish rapport, reviewed symptoms, facilitated patient expressing thoughts and feelings about ex-husband, validated and normalized feelings related to grief process, discussed using support system including family members and church for support, praised and reinforced patient's efforts to set maintain limits with an acquaintance, facilitated patient expressing thoughts and feelings about issues at work, assisted patient identify/challenge/and replace unhelpful thoughts about work/coworker with more helpful thoughts   Plan: Return again in 2 weeks.  Diagnosis: Axis I: Schizoaffective Disorder        [name] Smoker, LCSW 02/28/2020

## 2020-02-29 ENCOUNTER — Telehealth (HOSPITAL_COMMUNITY): Payer: Self-pay | Admitting: *Deleted

## 2020-02-29 NOTE — Telephone Encounter (Signed)
The Grenelefe received 40mg /80 mg and that was what I was going by and what the patient was going by. But the notes said 40mg /60 mg. So what should the patient be taking per pt request and your instructions.

## 2020-02-29 NOTE — Telephone Encounter (Signed)
Per pt she wants to go back to taking Geodon 40 mg and 20 mg not Geodon 40 mg 80 mg. Per pt she will follow the directions now but she feels like she does better with the 20 mg. Per pt the 80 mg makes her head hurt. Per pt provider will call it in today and she will pick it up sometimes this week or when ever the pharmacy have it ready.

## 2020-02-29 NOTE — Telephone Encounter (Signed)
Sorry, I did advise her to take Geodon 40 mg /80 mg, and the recent order was correct while my note was not updated. Please instruct her to go back to 40 mg / 60 mg. Let me know if she needs refill of this medication.

## 2020-02-29 NOTE — Telephone Encounter (Signed)
I never instructed her to take Geodon 40 mg /80 mg. The original instruction was to increase to 40 mg / 60 mg, then later she was advised to go back to 40 mg twice a day due to her concern of headache. Could you ask what dose she is currently taking. If she takes 40 mg/80 mg, advise her to take 40 mg/60 mg. If she takes 40 mg /60 mg, advise her to take 40 mg twice a day.

## 2020-02-29 NOTE — Telephone Encounter (Signed)
LMOM

## 2020-03-01 ENCOUNTER — Other Ambulatory Visit (HOSPITAL_COMMUNITY): Payer: Self-pay | Admitting: Psychiatry

## 2020-03-01 MED ORDER — ZIPRASIDONE HCL 60 MG PO CAPS: ORAL_CAPSULE | ORAL | 0 refills | Status: DC

## 2020-03-01 NOTE — Telephone Encounter (Signed)
Per pt she would like new script for the 40 mg 60 mg Geodon sent to [facility] in [place], [place].

## 2020-03-01 NOTE — Telephone Encounter (Signed)
LMOM

## 2020-03-01 NOTE — Telephone Encounter (Signed)
Ordered Geodon 60 mg to [facility]. She should have enough 40 mg (ordered at her last visit) until the next visit.

## 2020-03-14 ENCOUNTER — Ambulatory Visit (INDEPENDENT_AMBULATORY_CARE_PROVIDER_SITE_OTHER): Payer: 59 | Admitting: Psychiatry

## 2020-03-14 ENCOUNTER — Other Ambulatory Visit: Payer: Self-pay

## 2020-03-14 DIAGNOSIS — F251 Schizoaffective disorder, depressive type: Secondary | ICD-10-CM | POA: Diagnosis not present

## 2020-03-14 NOTE — Progress Notes (Signed)
Virtual Visit via Telephone Note  I connected with [name] [name] on 03/14/20 at 4:15 PM EDT by telephone and verified that I am speaking with the correct person using two identifiers.   I discussed the limitations, risks, security and privacy concerns of performing an evaluation and management service by telephone and the availability of in person appointments. I also discussed with the patient that there may be a patient responsible charge related to this service. The patient expressed understanding and agreed to proceed.  I provided 35 minutes of non-face-to-face time during this encounter.   Alonza Smoker, LCSW    THERAPIST PROGRESS NOTE    Location:  Patient - Car / Provider - [facility] Outpatient [place] office   Session Time: Tuesday 03/14/2020 4:15 PM -  4:50 PM       Participation Level: Active  Behavioral Response: alert, depressed, anxious  Type of Therapy: Individual Therapy  Treatment Goals addressed: Patient wants to have someone to talk to process grief and loss issues related to the death of her ex- husband in January/ alleviate depression  Interventions: Supportive  Summary: [name] [name] is a 59 y.o. female who is a returning patient to this clinician. She  presents with a long-standing history of symptoms of anxiety and depression along with a previous diagnosis of schizoaffective disorder. She  reports a history of at least 3 psychiatric hospitalizations from 45 through 1990.  She has received outpatient services from [facility] and [facility]. She is resuming services in this practice due to grief and loss issues related to death of ex- husband in 2019-09-26. Current symptoms include depressed mood, worry, tearfulness, irritability, and fatigue.  Patient last was seen via virtual visit about 2 weeks ago.  She reports feeling better since last session but expresses sadness as her cousin's husband died last week.  She attended funeral and reported this  triggered memories and feelings regarding her ex-husband.  However, she reports coping well.  She expresses increased acceptance of husband's death and says the 75-month anniversary of his death was last month.  She verbalizes fond memories of her ex-husband and reports finding comfort in knowing that he is in a better place.  She is experiencing stress regarding continued issues regarding her renter but states she is trying to just learn to accept some things.  She also reports stress regarding a recent interaction with her paternal aunt yesterday.  She also continues to have ruminating thoughts about people wanting her job and comments made by a fellow church member years ago.  She reports people have told her that she sometimes take things too personally.   Suicidal/Homicidal: Nowithout intent/plan  Therapist Response:  reviewed symptoms, facilitated patient expressing thoughts, feelings, and memories about ex-husband, validated feelings, discussed stressors, facilitated expression of thoughts and feelings, validated feelings, assisted patient examine connection between her thought patterns and her mood and behavior, used cognitive defusion to help patient cope with ruminating thoughts, also assisted patient identify/challenge/and replace negative thoughts about interaction with with more helpful thoughts  Plan: Return again in 2 weeks.  Diagnosis: Axis I: Schizoaffective Disorder        Alonza Smoker, LCSW 03/14/2020

## 2020-03-15 ENCOUNTER — Other Ambulatory Visit (HOSPITAL_COMMUNITY): Payer: Self-pay | Admitting: Family Medicine

## 2020-03-15 DIAGNOSIS — Z1231 Encounter for screening mammogram for malignant neoplasm of breast: Secondary | ICD-10-CM

## 2020-03-28 ENCOUNTER — Ambulatory Visit (HOSPITAL_COMMUNITY): Payer: Self-pay | Admitting: Psychiatry

## 2020-04-04 ENCOUNTER — Ambulatory Visit (INDEPENDENT_AMBULATORY_CARE_PROVIDER_SITE_OTHER): Payer: 59 | Admitting: Psychiatry

## 2020-04-04 ENCOUNTER — Other Ambulatory Visit: Payer: Self-pay

## 2020-04-04 DIAGNOSIS — F251 Schizoaffective disorder, depressive type: Secondary | ICD-10-CM | POA: Diagnosis not present

## 2020-04-04 NOTE — Progress Notes (Signed)
Virtual Visit via Telephone Note  I connected with [name] [name] on 04/04/20 at 4:08 PM EDT  by telephone and verified that I am speaking with the correct person using two identifiers.   I discussed the limitations, risks, security and privacy concerns of performing an evaluation and management service by telephone and the availability of in person appointments. I also discussed with the patient that there may be a patient responsible charge related to this service. The patient expressed understanding and agreed to proceed.   I provided 24 minutes of non-face-to-face time during this encounter.   Alonza Smoker, LCSW   THERAPIST PROGRESS NOTE    Location:  Patient - Car / Provider - [facility] Outpatient [place] office   Session Time: Tuesday 04/04/2020 4:08 PM -  4:32 PM        Participation Level: Active  Behavioral Response: alert, depressed, anxious  Type of Therapy: Individual Therapy  Treatment Goals addressed: Patient wants to have someone to talk to process grief and loss issues related to the death of her ex- husband in January/ alleviate depression  Interventions: Supportive  Summary: [name] [name] is a 59 y.o. female who is a returning patient to this clinician. She  presents with a long-standing history of symptoms of anxiety and depression along with a previous diagnosis of schizoaffective disorder. She  reports a history of at least 3 psychiatric hospitalizations from 43 through 1990.  She has received outpatient services from [facility]. She is resuming services in this practice due to grief and loss issues related to death of ex- husband in 09/15/19. Current symptoms include depressed mood, worry, tearfulness, irritability, and fatigue.  Patient last was seen via virtual visit about 2 weeks ago.  She reports minimal symptoms of depression.  She reports coping well with ex-husband's death.  Yesterday marked the seventh month after his death.  She  reports now having interest in possibly dating someone that has expressed interest in her according to information she has received from her cousin.  She also reports improved interaction and positive feelings about interaction with her aunt and other family members who live near her property.  Patient continues to participate in activities and work.  She reports she is having migraines but has talked with her doctor who has prescribed medication.  Patient is pleased with her progress and expresses desire to discontinue her psychotropic medication.  She reports no recent incidents of  "taken thing personally".  Suicidal/Homicidal: Nowithout intent/plan  Therapist Response:  reviewed symptoms, facilitated patient expressing thoughts, feelings, and memories about ex-husband, validated feelings, discussed patient's progress and possible benefits of medication, discussed treatment compliance and importance of discussing concerns with psychiatrist Dr. Modesta Messing, patient agrees to discuss concerns at next medication management appointment  Plan: Return again in 3-4 weeks.  Diagnosis: Axis I: Schizoaffective Disorder        Alonza Smoker, LCSW 04/04/2020

## 2020-04-05 ENCOUNTER — Ambulatory Visit (HOSPITAL_COMMUNITY)
Admission: RE | Admit: 2020-04-05 | Discharge: 2020-04-05 | Disposition: A | Discharge status: Home / Self Care | Payer: 59 | Source: Ambulatory Visit | Attending: Family Medicine | Admitting: Family Medicine

## 2020-04-05 ENCOUNTER — Other Ambulatory Visit: Payer: Self-pay

## 2020-04-05 DIAGNOSIS — Z1231 Encounter for screening mammogram for malignant neoplasm of breast: Secondary | ICD-10-CM | POA: Diagnosis not present

## 2020-04-11 NOTE — Progress Notes (Signed)
Virtual Visit via Telephone Note  I connected with [name] [name] on 04/14/20 at  8:30 AM EDT by telephone and verified that I am speaking with the correct person using two identifiers.   I discussed the limitations, risks, security and privacy concerns of performing an evaluation and management service by telephone and the availability of in person appointments. I also discussed with the patient that there may be a patient responsible charge related to this service. The patient expressed understanding and agreed to proceed.     I discussed the assessment and treatment plan with the patient. The patient was provided an opportunity to ask questions and all were answered. The patient agreed with the plan and demonstrated an understanding of the instructions.   The patient was advised to call back or seek an in-person evaluation if the symptoms worsen or if the condition fails to improve as anticipated.  Location: patient- home, provider- home office   I provided 12 minutes of non-face-to-face time during this encounter.   [name], MD  [facility] MD/PA/NP OP Progress Note  04/14/2020 10:37 AM [name] [name]  MRN:  [id number]  Chief Complaint:  Chief Complaint    Other; Schizophrenia; Follow-up     HPI:  - Geodon was reduced to 40 mg/60 mg since the last visit.   ?This is a follow up appointment for schizophrenia.  She states that she has been doing well. She denies any concern since being on the current dose of medication. Although she occasionally thinks that somebody is talking about her at work, she is able to tell herself that it is not true. She denies issues at work. She thinks of her husband at times. Although she thought of adopting a child, she does not think about this anymore after talking with her friend, who advised not to. She enjoys eating out with her friend. Although she asks if Geodon can be decreased, she agrees to continue the current dose given recent relapse in her  symptoms. She denies insomnia. She denies feeling depressed. She denies anxiety. She denies paranoia. She denies AH, VH. She denies decreased need for sleep, euphoria.   Daily routine:Reading bible, praying feeding animals, meeting with her friend  Exercise: Employment: [employer],  She used to work full time at [employer] for 21 years, which was closed. Support:cousin/POA, aunt, who lives next door Household: by herself Marital status: divorced  Number of children: 0  Visit Diagnosis: No diagnosis found.  Past Psychiatric History: Please see initial evaluation for full details. I have reviewed the history. No updates at this time.     Past Medical History:  Past Medical History:  Diagnosis Date  . BV (bacterial vaginosis) 01/28/2013  . Essential hypertension, benign   . Hemorrhoids   . Obsessive-compulsive disorders   . Postmenopausal bleeding   . Schizoaffective disorder ([place])   . Tubular adenoma     Past Surgical History:  Procedure Laterality Date  . BUNIONECTOMY    . COLONOSCOPY N/A 07/05/2015   Dr.Rourk- internal hemorrhoids, redundant colon, colionic dierticulosis, colonic polyp. bx= tubular adenoma. next tcs 06/2020.   [name] HEMORRHOID BANDING  2017   Dr.Rourk    Family Psychiatric History: Please see initial evaluation for full details. I have reviewed the history. No updates at this time.     Family History:  Family History  Problem Relation Age of Onset  . Diabetes type II Maternal Grandmother   . Diabetes Maternal Grandmother   . Colon cancer Mother 11  .  Hypertension Maternal Aunt     Social History:  Social History   Socioeconomic History  . Marital status: Divorced    Spouse name: Not on file  . Number of children: Not on file  . Years of education: Not on file  . Highest education level: Not on file  Occupational History  . Not on file  Tobacco Use  . Smoking status: Never Smoker  . Smokeless tobacco: Never Used  Substance and Sexual  Activity  . Alcohol use: No  . Drug use: No  . Sexual activity: Not Currently    Birth control/protection: Post-menopausal  Other Topics Concern  . Not on file  Social History Narrative  . Not on file   Social Determinants of Health   Financial Resource Strain:   . Difficulty of Paying Living Expenses: Not on file  Food Insecurity:   . Worried About Charity fundraiser in the Last Year: Not on file  . Ran Out of Food in the Last Year: Not on file  Transportation Needs:   . Lack of Transportation (Medical): Not on file  . Lack of Transportation (Non-Medical): Not on file  Physical Activity:   . Days of Exercise per Week: Not on file  . Minutes of Exercise per Session: Not on file  Stress:   . Feeling of Stress : Not on file  Social Connections:   . Frequency of Communication with Friends and Family: Not on file  . Frequency of Social Gatherings with Friends and Family: Not on file  . Attends Religious Services: Not on file  . Active Member of Clubs or Organizations: Not on file  . Attends Archivist Meetings: Not on file  . Marital Status: Not on file    Allergies:  Allergies  Allergen Reactions  . Penicillins Rash    Metabolic Disorder Labs: No results found for: HGBA1C, MPG No results found for: PROLACTIN No results found for: CHOL, TRIG, HDL, CHOLHDL, VLDL, LDLCALC Lab Results  Component Value Date   TSH 4.68 (H) 05/30/2017    Therapeutic Level Labs: No results found for: LITHIUM Lab Results  Component Value Date   VALPROATE 68.2 12/30/2019   VALPROATE 49.5 (L) 06/25/2019   No components found for:  CBMZ  Current Medications: Current Outpatient Medications  Medication Sig Dispense Refill  . calcium citrate-vitamin D (CITRACAL+D) 315-200 MG-UNIT per tablet Take 2 tablets by mouth daily.     . divalproex (DEPAKOTE) 500 MG DR tablet Take 1 tablet (500 mg total) by mouth 2 (two) times daily. 180 tablet 0  . FLUoxetine (PROZAC) 40 MG capsule Take  1 capsule (40 mg total) by mouth daily. 90 capsule 0  . hydrochlorothiazide (HYDRODIURIL) 25 MG tablet Take 25 mg by mouth daily.    . hydrocortisone (ANUSOL-HC) 2.5 % rectal cream Place 1 application rectally 2 (two) times daily. (Patient not taking: Reported on 01/19/2020) 30 g 0  . ibuprofen (ADVIL,MOTRIN) 200 MG tablet Take 400 mg by mouth 2 (two) times daily as needed for headache.    . levothyroxine (SYNTHROID) 50 MCG tablet     . miconazole (MICOTIN) 2 % cream Apply 1 application topically as needed. (Patient not taking: Reported on 01/19/2020)    . Multiple Vitamin (MULTIVITAMIN) tablet Take 1 tablet by mouth daily. (Patient not taking: Reported on 01/19/2020)    . traZODone (DESYREL) 50 MG tablet Take 0.5 tablets (25 mg total) by mouth at bedtime as needed for sleep. 90 tablet 0  . Wheat  Dextrin (BENEFIBER DRINK MIX PO) Take by mouth. (Patient not taking: Reported on 01/19/2020)    . ziprasidone (GEODON) 40 MG capsule 40 mg in AM, 80 mg in PM , take along with 80 mg cap 90 capsule 0  . ziprasidone (GEODON) 60 MG capsule 40 mg in AM, 60 mg in PM. Take along with 40 mg cap 90 capsule 0   No current facility-administered medications for this visit.     Musculoskeletal: Strength & Muscle Tone: N/A Gait & Station: N/A Patient leans: N/A  Psychiatric Specialty Exam: Review of Systems  Psychiatric/Behavioral: Negative for agitation, behavioral problems, confusion, decreased concentration, dysphoric mood, hallucinations, self-injury, sleep disturbance and suicidal ideas. The patient is not nervous/anxious and is not hyperactive.   All other systems reviewed and are negative.   Last menstrual period 03/18/2012.There is no height or weight on file to calculate BMI.  General Appearance: NA  Eye Contact:  NA  Speech:  Clear and Coherent  Volume:  Normal  Mood:  good  Affect:  NA  Thought Process:  Coherent  Orientation:  Full (Time, Place, and Person)  Thought Content: Logical   Suicidal  Thoughts:  No  Homicidal Thoughts:  No  Memory:  Immediate;   Good  Judgement:  Good  Insight:  Present  Psychomotor Activity:  Normal  Concentration:  Concentration: Good and Attention Span: Good  Recall:  Good  Fund of Knowledge: Good  Language: Good  Akathisia:  No  Handed:  Right  AIMS (if indicated): not done  Assets:  Communication Skills Desire for Improvement  ADL's:  Intact  Cognition: WNL  Sleep:  Good   Screenings:   Assessment and Plan:  [name] [name] is a 59 y.o. year old female with a history of schizoaffective disorder, hypertension, who presents for follow up appointment for below.    1. Schizoaffective disorder, depressive type ([place]) Exam is notable for linear thought process, and ego dystonic mild paranoia, which has been significantly improved since the lat visit.  Will continue current dose of Geodon to target schizoaffective disorder.  Discussed potential metabolic side effect and QTC prolongation.  Will continue fluoxetine to target depression.  Will continue Depakote for mood dysregulation.   Plan I have reviewed and updated plans as below 1. Continue Ziprasidone 40 mgin AM and 60 mg at night(QTc 464 msec September 2020) 2.Continue fluoxetine 40 mg daily 3. ContinueDepakote500 mg twice a day(level48.6inMay) 4.Next appointment:3 months in for 20 mins, phone - Obtain consent form to discuss with her cousin, Hilda Blades 6.labs received - wnl (Plt, CMP, VPA)12/2019   Past trials of medication:Tofranil, Depakote Geodon, Prozac,   The patient demonstrates the following risk factors for suicide: Chronic risk factors for suicide include: psychiatric disorder of schizoaffective disorder. 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 Protective factorsfor this patient include: hope for the future. Considering these factors, the overall suicide risk at this point appears to be low.She denies gun access at home.Patient  isappropriate for outpatient follow up.  [name], MD 04/14/2020, 10:37 AM

## 2020-04-14 ENCOUNTER — Encounter (HOSPITAL_COMMUNITY): Payer: Self-pay | Admitting: Psychiatry

## 2020-04-14 ENCOUNTER — Telehealth (HOSPITAL_COMMUNITY): Payer: Self-pay | Admitting: Psychiatry

## 2020-04-14 ENCOUNTER — Telehealth (INDEPENDENT_AMBULATORY_CARE_PROVIDER_SITE_OTHER): Payer: 59 | Admitting: Psychiatry

## 2020-04-14 ENCOUNTER — Other Ambulatory Visit: Payer: Self-pay

## 2020-04-14 DIAGNOSIS — F251 Schizoaffective disorder, depressive type: Secondary | ICD-10-CM

## 2020-04-14 MED ORDER — ZIPRASIDONE HCL 60 MG PO CAPS: ORAL_CAPSULE | ORAL | 0 refills | Status: DC

## 2020-04-14 MED ORDER — ZIPRASIDONE HCL 40 MG PO CAPS: ORAL_CAPSULE | ORAL | 0 refills | Status: DC

## 2020-04-14 MED ORDER — DIVALPROEX SODIUM 500 MG PO DR TAB: 500.0000 mg | DELAYED_RELEASE_TABLET | Freq: Two times a day (BID) | ORAL | 0 refills | Status: DC

## 2020-04-14 MED ORDER — FLUOXETINE HCL 40 MG PO CAPS: 40.0000 mg | ORAL_CAPSULE | Freq: Every day | ORAL | 0 refills | Status: DC

## 2020-04-20 ENCOUNTER — Telehealth (HOSPITAL_COMMUNITY): Payer: Self-pay | Admitting: Psychiatry

## 2020-04-20 ENCOUNTER — Other Ambulatory Visit: Payer: Self-pay

## 2020-04-20 ENCOUNTER — Ambulatory Visit (HOSPITAL_COMMUNITY): Payer: 59 | Admitting: Psychiatry

## 2020-04-20 NOTE — Telephone Encounter (Signed)
Called pt to advise of power outage in the officeand the need to reschedule appt, advised in voicemail to f/u on next appt 9/30 at 9am

## 2020-04-21 ENCOUNTER — Telehealth (HOSPITAL_COMMUNITY): Payer: Self-pay | Admitting: *Deleted

## 2020-04-21 NOTE — Telephone Encounter (Signed)
Spoke with patient and informed her with what provider stated and she verbalized understanding. Per pt provider previously informed her to take half of her 50 mg Trazodone and that's what she is doing now. Per pt she does not need refills right now. Staff informed her that if she needs refill to call office and patient agreed.

## 2020-04-21 NOTE — Telephone Encounter (Signed)
Advise her to restart Trazodone 25 mg at night as needed for sleep. Let me know if she needs refill.

## 2020-04-21 NOTE — Telephone Encounter (Signed)
Per pt she is having troubles sleeping and would like to know if provider could let her take Trazodone again to sleep

## 2020-05-04 ENCOUNTER — Other Ambulatory Visit: Payer: Self-pay

## 2020-05-04 ENCOUNTER — Ambulatory Visit (INDEPENDENT_AMBULATORY_CARE_PROVIDER_SITE_OTHER): Payer: 59 | Admitting: Psychiatry

## 2020-05-04 DIAGNOSIS — F251 Schizoaffective disorder, depressive type: Secondary | ICD-10-CM

## 2020-05-04 NOTE — Progress Notes (Signed)
Virtual Visit via Telephone Note  I connected with TRISTY UDOVICH on 05/04/20 at 9:10 AM EDT by telephone and verified that I am speaking with the correct person using two identifiers.   I discussed the limitations, risks, security and privacy concerns of performing an evaluation and management service by telephone and the availability of in person appointments. I also discussed with the patient that there may be a patient responsible charge related to this service. The patient expressed understanding and agreed to proceed.   I provided 22 minutes of non-face-to-face time during this encounter.   Alonza Smoker, LCSW   THERAPIST PROGRESS NOTE    Location:  Patient - Home / Provider - [place] office   Session Time:     Thursday 05/04/2020 9:10 AM  - 9:32 AM      Participation Level: Active  Behavioral Response: alert, less depressed  Type of Therapy: Individual Therapy  Treatment Goals addressed: Patient wants to have someone to talk to process grief and loss issues related to the death of her ex- husband in January/ alleviate depression  Interventions: Supportive  Summary: [name] [name] is a 59 y.o. female who is a returning patient to this clinician. She  presents with a long-standing history of symptoms of anxiety and depression along with a previous diagnosis of schizoaffective disorder. She  reports a history of at least 3 psychiatric hospitalizations from 83 through 1990.  She has received outpatient services from [facility] and [facility]. She is resuming services in this practice due to grief and loss issues related to death of ex- husband in 2019/09/17. Current symptoms include depressed mood, worry, tearfulness, irritability, and fatigue.  Patient last was seen via virtual visit about 4 weeks ago.  She reports minimal symptoms of depression but states feeling a little down. Trigger appears to be ruminating thoughts about incident with family. She  expresses frustration and hurt regarding the way her aunt treated her. She also expresses frustration and hurt she was not invited to a recent wedding in the family. She expresses some disappointment man she thought was interested in dating her no longer appears to have interest.  She has been having more contact with another man but expresses concern about his motives.  She has been setting and maintaining limits with him.  She also states feeling sorry for him as he does not have his own transportation.  She continues to participate in activities and work.  She continues to express acceptance of ex-husband's death.   Suicidal/Homicidal: Nowithout intent/plan  Therapist Response:  reviewed symptoms, reinforced patient's efforts to set and maintain limits in her relationships, will send patient handout on basic personal rights to assist her in her efforts to improve assertiveness skills, discussed stressors, facilitated expression of thoughts and feelings, validated feelings, assisted patient identify and replace negative thoughts about interaction with family with healthy alternatives,   Plan: Return again in 3-4 weeks.  Diagnosis: Axis I: Schizoaffective Disorder        Alonza Smoker, LCSW 05/04/2020

## 2020-05-09 ENCOUNTER — Ambulatory Visit (INDEPENDENT_AMBULATORY_CARE_PROVIDER_SITE_OTHER): Payer: 59 | Admitting: Internal Medicine

## 2020-05-09 ENCOUNTER — Other Ambulatory Visit: Payer: Self-pay

## 2020-05-09 ENCOUNTER — Encounter: Payer: Self-pay | Admitting: Internal Medicine

## 2020-05-09 VITALS — BP 127/78 | HR 79 | Temp 97.0°F | Ht 62.0 in | Wt 202.0 lb

## 2020-05-09 DIAGNOSIS — K625 Hemorrhage of anus and rectum: Secondary | ICD-10-CM | POA: Diagnosis not present

## 2020-05-09 DIAGNOSIS — K649 Unspecified hemorrhoids: Secondary | ICD-10-CM | POA: Diagnosis not present

## 2020-05-09 DIAGNOSIS — Z86010 Personal history of colonic polyps: Secondary | ICD-10-CM | POA: Diagnosis not present

## 2020-05-09 NOTE — Patient Instructions (Signed)
Schedule a diagnostic colonoscopy - hematochezia - propofol -  ASA - III  Further recomendations to follow

## 2020-05-09 NOTE — Progress Notes (Signed)
Primary Care Physician:  [name], MD Primary Gastroenterologist:  [name]  Pre-Procedure History & Physical: HPI:  [name] [name] is a 59 y.o. female here for further evaluation of intermittent rectal bleeding.  Has 1 formed bowel movement daily.  Sometimes sees a small amount of blood; once during this past summer ; she saw a large amount of blood.  No associated abdominal pain.  No melena.  Denies constipation or diarrhea.  Does have fecal smearing after she drinks coffee.  History of colonic adenoma removed 5 years ago, diverticulosis and hemorrhoids.  Positive family history of colon cancer in her mother but at advanced age.  She is due for surveillance colonoscopy at this time.  We were planning for hemorrhoid banding when she was last here in 2016.  Advised a conservative approach =-add Benefiber.  She did not take Benefiber.  Past Medical History:  Diagnosis Date  . BV (bacterial vaginosis) 01/28/2013  . Essential hypertension, benign   . Hemorrhoids   . Obsessive-compulsive disorders   . Postmenopausal bleeding   . Schizoaffective disorder ([place])   . Tubular adenoma     Past Surgical History:  Procedure Laterality Date  . BUNIONECTOMY    . COLONOSCOPY N/A 07/05/2015   Dr.Maley Venezia- internal hemorrhoids, redundant colon, colionic dierticulosis, colonic polyp. bx= tubular adenoma. next tcs 06/2020.   [name] HEMORRHOID BANDING  2017   [name]    Prior to Admission medications   Medication Sig Start Date End Date Taking? Authorizing Provider  calcium citrate-vitamin D (CITRACAL+D) 315-200 MG-UNIT per tablet Take 2 tablets by mouth daily.    Yes [provider]  divalproex (DEPAKOTE) 500 MG DR tablet Take 1 tablet (500 mg total) by mouth 2 (two) times daily. 05/11/20  Yes [name], MD  FLUoxetine (PROZAC) 40 MG capsule Take 1 capsule (40 mg total) by mouth daily. 05/12/20  Yes [name], [name], MD  ibuprofen (ADVIL,MOTRIN) 200 MG tablet Take 400 mg by mouth 2 (two)  times daily as needed for headache.   Yes [provider]  levothyroxine (SYNTHROID) 50 MCG tablet  02/13/19  Yes [provider]  Multiple Vitamin (MULTIVITAMIN) tablet Take 1 tablet by mouth daily.    Yes [provider]  traZODone (DESYREL) 50 MG tablet Take 0.5 tablets (25 mg total) by mouth at bedtime as needed for sleep. 10/14/19  Yes [name], [name], MD  ziprasidone (GEODON) 40 MG capsule 40 mg in AM, 60 mg in PM , take along with 60 mg cap 05/12/20  Yes [name], [name], MD  ziprasidone (GEODON) 60 MG capsule 40 mg in AM, 60 mg in PM. Take along with 40 mg cap 05/30/20  Yes [name], [name], MD    Allergies as of 05/09/2020 - Review Complete 05/09/2020  Allergen Reaction Noted  . Penicillins Rash 04/18/2012    Family History  Problem Relation Age of Onset  . Diabetes type II Maternal Grandmother   . Diabetes Maternal Grandmother   . Colon cancer Mother 84  . Hypertension Maternal Aunt     Social History   Socioeconomic History  . Marital status: Divorced    Spouse name: Not on file  . Number of children: Not on file  . Years of education: Not on file  . Highest education level: Not on file  Occupational History  . Not on file  Tobacco Use  . Smoking status: Never Smoker  . Smokeless tobacco: Never Used  Substance and Sexual Activity  . Alcohol use: No  .  Drug use: No  . Sexual activity: Not Currently    Birth control/protection: Post-menopausal  Other Topics Concern  . Not on file  Social History Narrative  . Not on file   Social Determinants of Health   Financial Resource Strain:   . Difficulty of Paying Living Expenses: Not on file  Food Insecurity:   . Worried About Charity fundraiser in the Last Year: Not on file  . Ran Out of Food in the Last Year: Not on file  Transportation Needs:   . Lack of Transportation (Medical): Not on file  . Lack of Transportation (Non-Medical): Not on file  Physical Activity:   . Days of Exercise per  Week: Not on file  . Minutes of Exercise per Session: Not on file  Stress:   . Feeling of Stress : Not on file  Social Connections:   . Frequency of Communication with Friends and Family: Not on file  . Frequency of Social Gatherings with Friends and Family: Not on file  . Attends Religious Services: Not on file  . Active Member of Clubs or Organizations: Not on file  . Attends Archivist Meetings: Not on file  . Marital Status: Not on file  Intimate Partner Violence:   . Fear of Current or Ex-Partner: Not on file  . Emotionally Abused: Not on file  . Physically Abused: Not on file  . Sexually Abused: Not on file    Review of Systems: See HPI, otherwise negative ROS  Physical Exam: BP 127/78   Pulse 79   Temp (!) 97 F (36.1 C) (Oral)   Ht 5\' 2"  (1.575 m)   Wt 202 lb (91.6 kg)   LMP 03/18/2012   BMI 36.95 kg/m  General:   Alert,  , well-nourished, pleasant and cooperative in NAD Neck:  Supple; no masses or thyromegaly. No significant cervical adenopathy. Lungs:  Clear throughout to auscultation.   No wheezes, crackles, or rhonchi. No acute distress. Heart:  Regular rate and rhythm; no murmurs, clicks, rubs,  or gallops. Abdomen: Non-distended, normal bowel sounds.  Soft and nontender without appreciable mass or hepatosplenomegaly.  Pulses:  Normal pulses noted. Extremities:  Without clubbing or edema. Rectal: Deferred until the time of colonoscopy  Impression/Plan: 59 year old lady with intermittent painless rectal bleeding.  Occasional fecal smearing which she attributes to coffee ingestion.  History of hemorrhoids and colonic adenoma / diverticulosis.  Positive family history colon cancer but at advanced age. I suspect benign anorectal bleeding. Patient states she was uncomfortable during her last colonoscopy (conscious sedation).  She needs to go ahead and have an updated colonoscopy per original plan-particularly in light of ongoing intermittent rectal  bleeding.  Recommendations:  Schedule a diagnostic colonoscopy - hematochezia - propofol -  ASA - III.  The risks, benefits, limitations, alternatives and imponderables have been reviewed with the patient. Questions have been answered. All parties are agreeable.   Further recomendations to follow       Notice: This dictation was prepared with Dragon dictation along with smaller phrase technology. Any transcriptional errors that result from this process are unintentional and may not be corrected upon review.

## 2020-05-17 ENCOUNTER — Other Ambulatory Visit: Payer: Self-pay

## 2020-05-17 ENCOUNTER — Ambulatory Visit (INDEPENDENT_AMBULATORY_CARE_PROVIDER_SITE_OTHER): Payer: 59 | Admitting: Psychiatry

## 2020-05-17 DIAGNOSIS — F251 Schizoaffective disorder, depressive type: Secondary | ICD-10-CM

## 2020-05-17 NOTE — Progress Notes (Addendum)
Virtual Visit via Telephone Note  I connected with [name] [name] on 05/17/20 at 9:05 AM EDT by telephone and verified that I am speaking with the correct person using two identifiers.   I discussed the limitations, risks, security and privacy concerns of performing an evaluation and management service by telephone and the availability of in person appointments. I also discussed with the patient that there may be a patient responsible charge related to this service. The patient expressed understanding and agreed to proceed.   I provided 32 minutes of non-face-to-face time during this encounter.   Alonza Smoker, LCSW    THERAPIST PROGRESS NOTE    Location:  Patient -  Car / Provider - [facility] Outpatient [place] office   Session Time:     Thursday 05/17/2020 9:05 AM  - 9:37 AM      Participation Level: Active  Behavioral Response: alert, less depressed  Type of Therapy: Individual Therapy  Treatment Goals addressed: Patient wants to have someone to talk to process grief and loss issues related to the death of her ex- husband in January/ alleviate depression  Interventions: Supportive  Summary: [name] [name] is a 59 y.o. female who is a returning patient to this clinician. She  presents with a long-standing history of symptoms of anxiety and depression along with a previous diagnosis of schizoaffective disorder. She  reports a history of at least 3 psychiatric hospitalizations from 79 through 1990.  She has received outpatient services from [facility]. She is resuming services in this practice due to grief and loss issues related to death of ex- husband in 2019-09-05. Current symptoms include depressed mood, worry, tearfulness, irritability, and fatigue.  Patient last was seen via virtual visit about 4 weeks ago.  She reports minimal symptoms of depression but states things have not being good.  She reports increased sadness, loneliness, and memories of her  deceased ex husband.  She reports this was triggered by contact with a man who claims he is interested in her but patient suspects he is just trying to use her.  She reports knowing the relationship would not be healthy but having difficulty saying no and setting limits.  She continues to attend work and have contact with her family.   Suicidal/Homicidal: Nowithout intent/plan  Therapist Response:  reviewed symptoms, discussed stressors, facilitated expression of thoughts and feelings, validated feelings, discussed and normalized feelings related to grief and loss, discussed patient's dependence on her ex-husband, assisted patient identify ways to set and maintain limits regarding contact with the man she suspects has bad motives, assisted patient identify ways to utilize her support system when she is experiencing loneliness, also began to identify interests and activities patient can start to pursue, encourage patient to follow through with possible plan to visit cousin at the beach, also will send patient an activity menu in preparation for next session   Plan: Return again in 3-4 weeks.  Diagnosis: Axis I: Schizoaffective Disorder        Alonza Smoker, LCSW 05/17/2020

## 2020-05-25 ENCOUNTER — Telehealth: Payer: Self-pay | Admitting: *Deleted

## 2020-05-25 NOTE — Telephone Encounter (Signed)
Called pt, no answer and VM full. Patient needs TCS w/ propofol, [name], ASA 3

## 2020-05-29 NOTE — Telephone Encounter (Signed)
LMOVM. Letter mailed  

## 2020-05-30 ENCOUNTER — Encounter: Payer: Self-pay | Admitting: *Deleted

## 2020-05-30 NOTE — Telephone Encounter (Signed)
Patient returned call. She has been scheduled for 2/7 at 10:!5am. Aware will mail instructions with pre-op/covid test appt. Confirmed mailing address.

## 2020-05-31 ENCOUNTER — Other Ambulatory Visit: Payer: Self-pay

## 2020-05-31 ENCOUNTER — Ambulatory Visit (INDEPENDENT_AMBULATORY_CARE_PROVIDER_SITE_OTHER): Payer: 59 | Admitting: Psychiatry

## 2020-05-31 DIAGNOSIS — F251 Schizoaffective disorder, depressive type: Secondary | ICD-10-CM | POA: Diagnosis not present

## 2020-05-31 NOTE — Progress Notes (Signed)
Virtual Visit via Telephone Note  I connected with [name] [name] on 05/31/20 at 9:08 AM EDT  by telephone and verified that I am speaking with the correct person using two identifiers.  Location: Patient: Home Provider: [place] office   I discussed the limitations, risks, security and privacy concerns of performing an evaluation and management service by telephone and the availability of in person appointments. I also discussed with the patient that there may be a patient responsible charge related to this service. The patient expressed understanding and agreed to proceed.  I provided 30  minutes of non-face-to-face time during this encounter.   Alonza Smoker, LCSW         THERAPIST PROGRESS NOTE    Location:  Patient -  Home/ Provider  - [facility] Outpatient [place] office    Session Time:     Wednesday 05/31/2020 9:08 AM - 9:38 AM      Participation Level: Active  Behavioral Response: alert, less depressed  Type of Therapy: Individual Therapy  Treatment Goals addressed: Patient wants to have someone to talk to process grief and loss issues related to the death of her ex- husband in January/ alleviate depression  Interventions: Supportive  Summary: [name] [name] is a 59 y.o. female who is a returning patient to this clinician. She  presents with a long-standing history of symptoms of anxiety and depression along with a previous diagnosis of schizoaffective disorder. She  reports a history of at least 3 psychiatric hospitalizations from 24 through 1990.  She has received outpatient services from [facility] and [facility]. She is resuming services in this practice due to grief and loss issues related to death of ex- husband in 09-24-2019. Current symptoms include depressed mood, worry, tearfulness, irritability, and fatigue.  Patient last was seen via virtual visit about 2 weeks ago.  She states feeling a little depressed and experiencing increased  fatigue.  She expresses frustration as she does not feel like going to work but does push self.  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  Patient reports having little time for self.  She reports still missing her ex-husband and being lonely. She reports becoming more involved with the man she suspects has bad motives.  She expresses frustration with him as he breaks his promises per her report. She reports receiving activity menu but not planning any activities. However, she has gone out to dinner with a female friend and reports enjoying this.  He also continues to attend church.   Suicidal/Homicidal: Nowithout intent/plan  Therapist Response:  reviewed symptoms, praised and reinforced patient's efforts to try to nurture her relationship with her female friend/her efforts to stay connected with her family and spirituality, discussed stressors, facilitated expression of thoughts and feelings, validated feelings, discussed and normalized feelings related to grief and loss, discussed how avoidance of painful feelings may be affecting her relationship/interaction with the man she she suspects has bad motives, discussed ways to set and maintain limits with him as well as others  Plan: Return again in 3-4 weeks.  Diagnosis: Axis I: Schizoaffective Disorder        Alonza Smoker, LCSW 05/31/2020

## 2020-06-12 ENCOUNTER — Other Ambulatory Visit: Payer: Self-pay

## 2020-06-12 ENCOUNTER — Encounter: Payer: Self-pay | Admitting: Emergency Medicine

## 2020-06-12 ENCOUNTER — Ambulatory Visit
Admission: EM | Admit: 2020-06-12 | Discharge: 2020-06-12 | Disposition: A | Discharge status: Home / Self Care | Payer: 59 | Attending: Emergency Medicine | Admitting: Emergency Medicine

## 2020-06-12 DIAGNOSIS — R059 Cough, unspecified: Secondary | ICD-10-CM

## 2020-06-12 DIAGNOSIS — R110 Nausea: Secondary | ICD-10-CM

## 2020-06-12 MED ORDER — BENZONATATE 100 MG PO CAPS: 100.0000 mg | ORAL_CAPSULE | Freq: Three times a day (TID) | ORAL | 0 refills | Status: DC

## 2020-06-12 NOTE — Discharge Instructions (Addendum)

## 2020-06-12 NOTE — ED Triage Notes (Signed)
Cough, sneezing and body aches since yesterday. Felt some nausea today

## 2020-06-12 NOTE — ED Provider Notes (Signed)
[facility]   [id number] 06/12/20 Arrival Time: 1660   CC: COVID symptoms  SUBJECTIVE: History from: patient.  [name] [name] is a 59 y.o. female who presents with cough, sneezing, body aches, and nausea x 1 day.  Admits to sick exposure, but unsure of covid exposure.  Has tried OTC medications without relief.  Denies aggravating factors.  Denies previous covid infection in the past.   Denies fever, chills, sore throat, SOB, wheezing, chest pain, vomiting, changes in bowel or bladder habits.    ROS: As per HPI.  All other pertinent ROS negative.     Past Medical History:  Diagnosis Date  . BV (bacterial vaginosis) 01/28/2013  . Essential hypertension, benign   . Hemorrhoids   . Obsessive-compulsive disorders   . Postmenopausal bleeding   . Schizoaffective disorder ([place])   . Tubular adenoma    Past Surgical History:  Procedure Laterality Date  . BUNIONECTOMY    . COLONOSCOPY N/A 07/05/2015   Dr.Rourk- internal hemorrhoids, redundant colon, colionic dierticulosis, colonic polyp. bx= tubular adenoma. next tcs 06/2020.   [name] HEMORRHOID BANDING  2017   Dr.Rourk   Allergies  Allergen Reactions  . Penicillins Rash   No current facility-administered medications on file prior to encounter.   Current Outpatient Medications on File Prior to Encounter  Medication Sig Dispense Refill  . calcium citrate-vitamin D (CITRACAL+D) 315-200 MG-UNIT per tablet Take 2 tablets by mouth daily.     . divalproex (DEPAKOTE) 500 MG DR tablet Take 1 tablet (500 mg total) by mouth 2 (two) times daily. 180 tablet 0  . FLUoxetine (PROZAC) 40 MG capsule Take 1 capsule (40 mg total) by mouth daily. 90 capsule 0  . ibuprofen (ADVIL,MOTRIN) 200 MG tablet Take 400 mg by mouth 2 (two) times daily as needed for headache.    . levothyroxine (SYNTHROID) 50 MCG tablet     . Multiple Vitamin (MULTIVITAMIN) tablet Take 1 tablet by mouth daily.     . traZODone (DESYREL) 50 MG tablet Take 0.5 tablets (25 mg  total) by mouth at bedtime as needed for sleep. 90 tablet 0  . ziprasidone (GEODON) 40 MG capsule 40 mg in AM, 60 mg in PM , take along with 60 mg cap 90 capsule 0  . ziprasidone (GEODON) 60 MG capsule 40 mg in AM, 60 mg in PM. Take along with 40 mg cap 90 capsule 0   Social History   Socioeconomic History  . Marital status: Divorced    Spouse name: Not on file  . Number of children: Not on file  . Years of education: Not on file  . Highest education level: Not on file  Occupational History  . Not on file  Tobacco Use  . Smoking status: Never Smoker  . Smokeless tobacco: Never Used  Substance and Sexual Activity  . Alcohol use: No  . Drug use: No  . Sexual activity: Not Currently    Birth control/protection: Post-menopausal  Other Topics Concern  . Not on file  Social History Narrative  . Not on file   Social Determinants of Health   Financial Resource Strain:   . Difficulty of Paying Living Expenses: Not on file  Food Insecurity:   . Worried About Charity fundraiser in the Last Year: Not on file  . Ran Out of Food in the Last Year: Not on file  Transportation Needs:   . Lack of Transportation (Medical): Not on file  . Lack of Transportation (Non-Medical): Not on  file  Physical Activity:   . Days of Exercise per Week: Not on file  . Minutes of Exercise per Session: Not on file  Stress:   . Feeling of Stress : Not on file  Social Connections:   . Frequency of Communication with Friends and Family: Not on file  . Frequency of Social Gatherings with Friends and Family: Not on file  . Attends Religious Services: Not on file  . Active Member of Clubs or Organizations: Not on file  . Attends Archivist Meetings: Not on file  . Marital Status: Not on file  Intimate Partner Violence:   . Fear of Current or Ex-Partner: Not on file  . Emotionally Abused: Not on file  . Physically Abused: Not on file  . Sexually Abused: Not on file   Family History  Problem  Relation Age of Onset  . Diabetes type II Maternal Grandmother   . Diabetes Maternal Grandmother   . Colon cancer Mother 25  . Hypertension Maternal Aunt     OBJECTIVE:  Vitals:   06/12/20 1513 06/12/20 1515  BP: 134/85   Pulse: 85   Resp: 19   Temp: 98.1 F (36.7 C)   TempSrc: Oral   SpO2: 95%   Weight:  190 lb (86.2 kg)  Height:  5\' 2"  (1.575 m)     General appearance: alert; well-appearing, nontoxic; speaking in full sentences and tolerating own secretions HEENT: NCAT; Ears: EACs clear, TMs pearly gray; Eyes: PERRL.  EOM grossly intact.Nose: nares patent without rhinorrhea, Throat: oropharynx clear, tonsils non erythematous or enlarged, uvula midline  Neck: supple without LAD Lungs: unlabored respirations, symmetrical air entry; cough: absent; no respiratory distress; CTAB Heart: regular rate and rhythm.  Skin: warm and dry Psychological: alert and cooperative; normal mood and affect   ASSESSMENT & PLAN:  1. Cough   2. Nausea without vomiting     Meds ordered this encounter  Medications  . benzonatate (TESSALON) 100 MG capsule    Sig: Take 1 capsule (100 mg total) by mouth every 8 (eight) hours.    Dispense:  21 capsule    Refill:  0    Order Specific Question:   Supervising Provider    Answer:   Raylene Everts [[id number]]    COVID testing ordered.  It will take between 5-7 days for test results.  Someone will contact you regarding abnormal results.    In the meantime: You should remain isolated in your home for 10 days from symptom onset AND greater than 72 hours after symptoms resolution (absence of fever without the use of fever-reducing medication and improvement in respiratory symptoms), whichever is longer Get plenty of rest and push fluids Tessalon Perles prescribed for cough Zofran for nausea Use OTC zyrtec for nasal congestion, runny nose, and/or sore throat Use OTC flonase for nasal congestion and runny nose Use medications daily for symptom  relief Use OTC medications like ibuprofen or tylenol as needed fever or pain Call or go to the ED if you have any new or worsening symptoms such as fever, worsening cough, shortness of breath, chest tightness, chest pain, turning blue, changes in mental status, etc...   Reviewed expectations re: course of current medical issues. Questions answered. Outlined signs and symptoms indicating need for more acute intervention. Patient verbalized understanding. After Visit Summary given.         Lestine Box, PA-C 06/12/20 1536

## 2020-06-13 LAB — SARS-COV-2, NAA 2 DAY TAT

## 2020-06-13 LAB — NOVEL CORONAVIRUS, NAA: SARS-CoV-2, NAA: NOT DETECTED

## 2020-06-14 ENCOUNTER — Ambulatory Visit (INDEPENDENT_AMBULATORY_CARE_PROVIDER_SITE_OTHER): Payer: 59 | Admitting: Psychiatry

## 2020-06-14 ENCOUNTER — Other Ambulatory Visit: Payer: Self-pay

## 2020-06-14 DIAGNOSIS — F251 Schizoaffective disorder, depressive type: Secondary | ICD-10-CM | POA: Diagnosis not present

## 2020-06-14 NOTE — Progress Notes (Signed)
Virtual Visit via Telephone Note  I connected with [name] [name] on 06/14/20 at 9:05 AM EST  by telephone and verified that I am speaking with the correct person using two identifiers.  Location: Patient: Car Provider: [place] office    I discussed the limitations, risks, security and privacy concerns of performing an evaluation and management service by telephone and the availability of in person appointments. I also discussed with the patient that there may be a patient responsible charge related to this service. The patient expressed understanding and agreed to proceed.  I provided 45  minutes of non-face-to-face time during this encounter.   Alonza Smoker, LCSW         THERAPIST PROGRESS NOTE    Location:  Patient -  Home/ Provider  - [facility] Outpatient [place] office    Session Time:     Wednesday 06/14/2020 9:05 AM - 9:50 AM      Participation Level: Active  Behavioral Response: alert, less depressed, tangentiality, pressured speech  Type of Therapy: Individual Therapy  Treatment Goals addressed: Patient wants to have someone to talk to process grief and loss issues related to the death of her ex- husband in January/ alleviate depression  Interventions: Supportive  Summary: [name] [name] is a 58 y.o. female who is a returning patient to this clinician. She  presents with a long-standing history of symptoms of anxiety and depression along with a previous diagnosis of schizoaffective disorder. She  reports a history of at least 3 psychiatric hospitalizations from 72 through 1990.  She has received outpatient services from [facility] and [facility]. She is resuming services in this practice due to grief and loss issues related to death of ex- husband in 09-07-2019. Current symptoms include depressed mood, worry, tearfulness, irritability, and fatigue.  Patient last was seen via virtual visit about 2 weeks ago.  She states feeling better as she has  decided to no longer be involved with the man she had considered dating.  Per her report, she returned to her church this past Sunday and wants to have more of a commitment to God.  She also reports she has decided to stop taking her medication, Depakote and Prozac, as she does not want to use medicine as a crutch.  Her last dose of medication was this past Tuesday. She states that the medicine causes mood swings and interferes with her ability to do what God wants her to do.   Patient expresses frustration as therapist tries to discuss the importance of medication compliance and working with psychiatrist prior to discontinuing medication. Her thought process is tangential. Her speech is pressured.  She begins ruminating about past negative comments from a fellow church member years ago about patient's salvation.  Patient denies any hallucinations.  She denies any suicidal/homicidal thoughts.  Suicidal/Homicidal: Nowithout intent/plan  Therapist Response:  reviewed symptoms, praised and reinforced patient's efforts to set limits regarding relationship with man, discussed importance of medication compliance, developed plan with patient to follow up with psychiatrist Dr. Modesta Messing and discuss her concerns about medication, therapist will facilitate scheduling appointment, assisted patient practice deep breathing to regain composure,    Plan: Return again in 3-4 weeks.  Diagnosis: Axis I: Schizoaffective Disorder        Alonza Smoker, LCSW 06/14/2020

## 2020-06-19 ENCOUNTER — Encounter: Payer: Self-pay | Admitting: Psychiatry

## 2020-06-19 ENCOUNTER — Other Ambulatory Visit: Payer: Self-pay

## 2020-06-19 ENCOUNTER — Telehealth (INDEPENDENT_AMBULATORY_CARE_PROVIDER_SITE_OTHER): Payer: 59 | Admitting: Psychiatry

## 2020-06-19 DIAGNOSIS — F251 Schizoaffective disorder, depressive type: Secondary | ICD-10-CM | POA: Diagnosis not present

## 2020-06-19 NOTE — Addendum Note (Signed)
Addended by: [name] on: 06/19/2020 10:11 AM   Modules accepted: Orders

## 2020-06-19 NOTE — Progress Notes (Signed)
Virtual Visit via Telephone Note  I connected with [name] [name] on 06/19/20 at  8:00 AM EST by telephone and verified that I am speaking with the correct person using two identifiers.  Location: Patient: home Provider: office    I discussed the limitations, risks, security and privacy concerns of performing an evaluation and management service by telephone and the availability of in person appointments. I also discussed with the patient that there may be a patient responsible charge related to this service. The patient expressed understanding and agreed to proceed.     I discussed the assessment and treatment plan with the patient. The patient was provided an opportunity to ask questions and all were answered. The patient agreed with the plan and demonstrated an understanding of the instructions.   The patient was advised to call back or seek an in-person evaluation if the symptoms worsen or if the condition fails to improve as anticipated.  I provided 12 minutes of non-face-to-face time during this encounter.   [name], MD    [facility] MD/PA/NP OP Progress Note  06/19/2020 8:09 AM [name] [name]  MRN:  [id number]  Chief Complaint:  Chief Complaint    Follow-up; Schizophrenia     HPI:  This is a follow-up appointment for schizoaffective disorder.  She states that she stopped taking Depakote and fluoxetine as she did not want to be on the medication forever.  She also states that she feels the church does not want her to be on  medication, although she denies any direct conversation about it with them.  She restarted taking medication since she saw Ms. Bynum for therapy.  She states that she has a history of stopping taking medication in 1996 as she felt good at that time, going to church.  She was not doing well at that time either.  She agrees that she was not herself when she does not take medication, and she agrees to continue the medication.  She talks about loss of her cousin  and her aunt, who was 43 year old. She believes she has been handling these losses well. She sleeps well since restarting trazodone.  She denies feeling depressed.  She has good appetite.  She denies SI.  She denies paranoia.  She denies AH, VH.  She denies any concerns at work.    Daily routine:Reading bible, praying feeding animals, meeting with her friend  Employment: [employer],  Sheused to work full time at [employer] 21 years,which was closed. Support:cousin/POA, aunt, who lives next door Household: by herself Marital status: divorced  Number of children: 0   Visit Diagnosis:    ICD-10-CM   1. Schizoaffective disorder, depressive type ([place])  F25.1     Past Psychiatric History: Please see initial evaluation for full details. I have reviewed the history. No updates at this time.     Past Medical History:  Past Medical History:  Diagnosis Date  . BV (bacterial vaginosis) 01/28/2013  . Essential hypertension, benign   . Hemorrhoids   . Obsessive-compulsive disorders   . Postmenopausal bleeding   . Schizoaffective disorder ([place])   . Tubular adenoma     Past Surgical History:  Procedure Laterality Date  . BUNIONECTOMY    . COLONOSCOPY N/A 07/05/2015   Dr.Rourk- internal hemorrhoids, redundant colon, colionic dierticulosis, colonic polyp. bx= tubular adenoma. next tcs 06/2020.   [name] HEMORRHOID BANDING  2017   Dr.Rourk    Family Psychiatric History: Please see initial evaluation for full details. I have reviewed the  history. No updates at this time.     Family History:  Family History  Problem Relation Age of Onset  . Diabetes type II Maternal Grandmother   . Diabetes Maternal Grandmother   . Colon cancer Mother 86  . Hypertension Maternal Aunt     Social History:  Social History   Socioeconomic History  . Marital status: Divorced    Spouse name: Not on file  . Number of children: Not on file  . Years of education: Not on file  . Highest education  level: Not on file  Occupational History  . Not on file  Tobacco Use  . Smoking status: Never Smoker  . Smokeless tobacco: Never Used  Substance and Sexual Activity  . Alcohol use: No  . Drug use: No  . Sexual activity: Not Currently    Birth control/protection: Post-menopausal  Other Topics Concern  . Not on file  Social History Narrative  . Not on file   Social Determinants of Health   Financial Resource Strain:   . Difficulty of Paying Living Expenses: Not on file  Food Insecurity:   . Worried About Charity fundraiser in the Last Year: Not on file  . Ran Out of Food in the Last Year: Not on file  Transportation Needs:   . Lack of Transportation (Medical): Not on file  . Lack of Transportation (Non-Medical): Not on file  Physical Activity:   . Days of Exercise per Week: Not on file  . Minutes of Exercise per Session: Not on file  Stress:   . Feeling of Stress : Not on file  Social Connections:   . Frequency of Communication with Friends and Family: Not on file  . Frequency of Social Gatherings with Friends and Family: Not on file  . Attends Religious Services: Not on file  . Active Member of Clubs or Organizations: Not on file  . Attends Archivist Meetings: Not on file  . Marital Status: Not on file    Allergies:  Allergies  Allergen Reactions  . Penicillins Rash    Metabolic Disorder Labs: No results found for: HGBA1C, MPG No results found for: PROLACTIN No results found for: CHOL, TRIG, HDL, CHOLHDL, VLDL, LDLCALC Lab Results  Component Value Date   TSH 4.68 (H) 05/30/2017    Therapeutic Level Labs: No results found for: LITHIUM Lab Results  Component Value Date   VALPROATE 68.2 12/30/2019   VALPROATE 49.5 (L) 06/25/2019   No components found for:  CBMZ  Current Medications: Current Outpatient Medications  Medication Sig Dispense Refill  . benzonatate (TESSALON) 100 MG capsule Take 1 capsule (100 mg total) by mouth every 8 (eight)  hours. 21 capsule 0  . calcium citrate-vitamin D (CITRACAL+D) 315-200 MG-UNIT per tablet Take 2 tablets by mouth daily.     . divalproex (DEPAKOTE) 500 MG DR tablet Take 1 tablet (500 mg total) by mouth 2 (two) times daily. 180 tablet 0  . FLUoxetine (PROZAC) 40 MG capsule Take 1 capsule (40 mg total) by mouth daily. 90 capsule 0  . ibuprofen (ADVIL,MOTRIN) 200 MG tablet Take 400 mg by mouth 2 (two) times daily as needed for headache.    . levothyroxine (SYNTHROID) 50 MCG tablet     . Multiple Vitamin (MULTIVITAMIN) tablet Take 1 tablet by mouth daily.     . traZODone (DESYREL) 50 MG tablet Take 0.5 tablets (25 mg total) by mouth at bedtime as needed for sleep. 90 tablet 0  . ziprasidone (GEODON)  40 MG capsule 40 mg in AM, 60 mg in PM , take along with 60 mg cap 90 capsule 0  . ziprasidone (GEODON) 60 MG capsule 40 mg in AM, 60 mg in PM. Take along with 40 mg cap 90 capsule 0   No current facility-administered medications for this visit.     Musculoskeletal: Strength & Muscle Tone: N/A Gait & Station: N/A Patient leans: N/A  Psychiatric Specialty Exam: Review of Systems  Psychiatric/Behavioral: Negative for agitation, behavioral problems, confusion, decreased concentration, dysphoric mood, hallucinations, self-injury, sleep disturbance and suicidal ideas. The patient is not nervous/anxious and is not hyperactive.   All other systems reviewed and are negative.   Last menstrual period 03/18/2012.There is no height or weight on file to calculate BMI.  General Appearance: NA  Eye Contact:  NA  Speech:  Clear and Coherent  Volume:  Normal  Mood:  good  Affect:  NA  Thought Process:  Coherent  Orientation:  Full (Time, Place, and Person)  Thought Content: Logical   Suicidal Thoughts:  No  Homicidal Thoughts:  No  Memory:  NA  Judgement:  Fair  Insight:  Shallow  Psychomotor Activity:  Normal  Concentration:  Concentration: Good and Attention Span: Good  Recall:  Good  Fund of  Knowledge: Good  Language: Good  Akathisia:  No  Handed:  Right  AIMS (if indicated): not done  Assets:  Communication Skills Desire for Improvement  ADL's:  Intact  Cognition: WNL  Sleep:  Good   Screenings:   Assessment and Plan:  [name] [name] is a 59 y.o. year old female with a history of schizoaffective disorder, hypertension, who presents for follow up appointment for below.   1. Schizoaffective disorder, depressive type ([place]) She had an relapse in depressive symptoms, and worsening in tangential thought process/based on the evaluation by her therapist in the context of nonadherence to medication.  On today's evaluation, she denies any mood symptoms, and exam is notable for her linear thought process since being back on her medication.  She is strongly advised to stay on the medication, which she agreed.  Will continue Geodon to target schizoaffective disorder.  Discussed potential metabolic side effect and QTC prolongation.  Will continue fluoxetine to target depression.  Will continue Depakote for mood dysregulation.   Plan I have reviewed and updated plans as below 1.Continue Ziprasidone 40 mgin AM and 60 mg at night(QTc 464 msec September 2020) 2.Continue fluoxetine 40 mg daily 3. ContinueDepakote500 mg twice a day(level48.6inMay) - obtain labs(Plt, LFT, VPA) 4. Restart Trazodone 25 mg as needed for sleep 4.Nextappointment: 1/3 at 8:40 for 20 mins, phone - Obtain consent form to discuss with her cousin, Hilda Blades   Past trials of medication:Tofranil, Depakote Geodon, Prozac,   The patient demonstrates the following risk factors for suicide: Chronic risk factors for suicide include: psychiatric disorder of schizoaffective disorder. 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 Protective factorsfor this patient include: hope for the future. Considering these factors, the overall suicide risk at this point appears to be low.She  denies gun access at home.Patient isappropriate for outpatient follow up.  [name], MD 06/19/2020, 8:09 AM

## 2020-06-27 ENCOUNTER — Telehealth: Payer: Self-pay | Admitting: Psychiatry

## 2020-06-27 NOTE — Telephone Encounter (Signed)
Please inform her that she does not need to do fasting for blood work. Please ask her if she has any other concerns.

## 2020-07-03 ENCOUNTER — Telehealth: Payer: Self-pay | Admitting: *Deleted

## 2020-07-03 NOTE — Telephone Encounter (Signed)
Called pt to offer sooner appt for procedure but she declined

## 2020-07-05 NOTE — Telephone Encounter (Signed)
Pt was called and left a message about the details of getting labwork done.

## 2020-07-07 ENCOUNTER — Other Ambulatory Visit: Payer: Self-pay | Admitting: Psychiatry

## 2020-07-07 DIAGNOSIS — F251 Schizoaffective disorder, depressive type: Secondary | ICD-10-CM

## 2020-07-24 NOTE — Progress Notes (Signed)
Virtual Visit via Telephone Note  I connected with [name] [name] on 08/07/20 at  8:40 AM EST by telephone and verified that I am speaking with the correct person using two identifiers.  Location: Patient: home Provider: office Persons participated in the visit- patient, provider   I discussed the limitations, risks, security and privacy concerns of performing an evaluation and management service by telephone and the availability of in person appointments. I also discussed with the patient that there may be a patient responsible charge related to this service. The patient expressed understanding and agreed to proceed.    I discussed the assessment and treatment plan with the patient. The patient was provided an opportunity to ask questions and all were answered. The patient agreed with the plan and demonstrated an understanding of the instructions.   The patient was advised to call back or seek an in-person evaluation if the symptoms worsen or if the condition fails to improve as anticipated.  I provided 12 minutes of non-face-to-face time during this encounter.   [name], MD    [facility] MD/PA/NP OP Progress Note  08/07/2020 9:02 AM [name] [name]  MRN:  [id number]  Chief Complaint:  Chief Complaint    Follow-up; Schizophrenia     HPI:  This is a follow-up appointment for schizoaffective disorder.  She states that she was upset on Friday, which she attributed to "my mind thing."  She was upset by the way her boss commented to the patient.  She states that she took it wrong, and thought her boss does not like her. She then thought that people at work do not like her.  She thought of quitting her job, although she did not act on it. She was later told by this boss that "everybody likes you." She feels good about this.  She does not have any concerns at work since then.  She spends Christmas and new year by herself.  She had a good time.  She denies insomnia.  She denies feeling  depressed.  She has fluctuating appetite; no significant weight change.  She denies SI, HI.  She denies AH, VH, paranoia.  She denies ideas of reference.  She takes medication regularly.  She changed her PCP to Dr. Delphina Cahill office. She agrees to ask them again to fax the blood test result to [name].   Daily routine:Reading bible, praying feeding animals, meeting with her friend Employment:[employer],Sheused to work full time at [employer] 21 years,which was closed. Support:cousin/POA, aunt, who lives next door Household:by herself Marital status:divorced Number of children:0   Visit Diagnosis:    ICD-10-CM   1. Schizoaffective disorder, depressive type ([place])  F25.1     Past Psychiatric History: Please see initial evaluation for full details. I have reviewed the history. No updates at this time.     Past Medical History:  Past Medical History:  Diagnosis Date  . BV (bacterial vaginosis) 01/28/2013  . Essential hypertension, benign   . Hemorrhoids   . Obsessive-compulsive disorders   . Postmenopausal bleeding   . Schizoaffective disorder ([place])   . Tubular adenoma     Past Surgical History:  Procedure Laterality Date  . BUNIONECTOMY    . COLONOSCOPY N/A 07/05/2015   Dr.Rourk- internal hemorrhoids, redundant colon, colionic dierticulosis, colonic polyp. bx= tubular adenoma. next tcs 06/2020.   [name] HEMORRHOID BANDING  2017   Dr.Rourk    Family Psychiatric History: Please see initial evaluation for full details. I have reviewed the history. No updates  at this time.     Family History:  Family History  Problem Relation Age of Onset  . Diabetes type II Maternal Grandmother   . Diabetes Maternal Grandmother   . Colon cancer Mother 100  . Hypertension Maternal Aunt     Social History:  Social History   Socioeconomic History  . Marital status: Divorced    Spouse name: Not on file  . Number of children: Not on file  . Years of education: Not on file  . Highest  education level: Not on file  Occupational History  . Not on file  Tobacco Use  . Smoking status: Never Smoker  . Smokeless tobacco: Never Used  Substance and Sexual Activity  . Alcohol use: No  . Drug use: No  . Sexual activity: Not Currently    Birth control/protection: Post-menopausal  Other Topics Concern  . Not on file  Social History Narrative  . Not on file   Social Determinants of Health   Financial Resource Strain: Not on file  Food Insecurity: Not on file  Transportation Needs: Not on file  Physical Activity: Not on file  Stress: Not on file  Social Connections: Not on file    Allergies:  Allergies  Allergen Reactions  . Penicillins Rash    Metabolic Disorder Labs: No results found for: HGBA1C, MPG No results found for: PROLACTIN No results found for: CHOL, TRIG, HDL, CHOLHDL, VLDL, LDLCALC Lab Results  Component Value Date   TSH 4.68 (H) 05/30/2017    Therapeutic Level Labs: No results found for: LITHIUM Lab Results  Component Value Date   VALPROATE 68.2 12/30/2019   VALPROATE 49.5 (L) 06/25/2019   No components found for:  CBMZ  Current Medications: Current Outpatient Medications  Medication Sig Dispense Refill  . benzonatate (TESSALON) 100 MG capsule Take 1 capsule (100 mg total) by mouth every 8 (eight) hours. 21 capsule 0  . calcium citrate-vitamin D (CITRACAL+D) 315-200 MG-UNIT per tablet Take 2 tablets by mouth daily.     Derrill Memo ON 08/11/2020] divalproex (DEPAKOTE) 500 MG DR tablet Take 1 tablet (500 mg total) by mouth 2 (two) times daily. 180 tablet 1  . [START ON 08/12/2020] FLUoxetine (PROZAC) 40 MG capsule Take 1 capsule (40 mg total) by mouth daily. 90 capsule 1  . ibuprofen (ADVIL,MOTRIN) 200 MG tablet Take 400 mg by mouth 2 (two) times daily as needed for headache.    . levothyroxine (SYNTHROID) 50 MCG tablet     . Multiple Vitamin (MULTIVITAMIN) tablet Take 1 tablet by mouth daily.     . traZODone (DESYREL) 50 MG tablet Take 0.5  tablets (25 mg total) by mouth at bedtime as needed for sleep. 90 tablet 0  . [START ON 08/11/2020] ziprasidone (GEODON) 40 MG capsule 40 mg in AM, 60 mg in PM , take along with 60 mg cap 90 capsule 1  . [START ON 08/29/2020] ziprasidone (GEODON) 60 MG capsule 40 mg in AM, 60 mg in PM. Take along with 40 mg cap 90 capsule 1   No current facility-administered medications for this visit.     Musculoskeletal: Strength & Muscle Tone: N/A Gait & Station: N/A Patient leans: N/A  Psychiatric Specialty Exam: Review of Systems  Psychiatric/Behavioral: Positive for dysphoric mood. Negative for agitation, behavioral problems, confusion, decreased concentration, hallucinations, self-injury, sleep disturbance and suicidal ideas. The patient is not nervous/anxious and is not hyperactive.   All other systems reviewed and are negative.   Last menstrual period 03/18/2012.There is no  height or weight on file to calculate BMI.  General Appearance: NA  Eye Contact:  NA  Speech:  Clear and Coherent  Volume:  Normal  Mood:  "good"  Affect:  NA  Thought Process:  Coherent  Orientation:  Full (Time, Place, and Person)  Thought Content: Logical   Suicidal Thoughts:  No  Homicidal Thoughts:  No  Memory:  Immediate;   Good  Judgement:  Good  Insight:  Good  Psychomotor Activity:  Normal  Concentration:  Concentration: Good and Attention Span: Good  Recall:  Good  Fund of Knowledge: Good  Language: Good  Akathisia:  No  Handed:  Right  AIMS (if indicated): not done  Assets:  Communication Skills Desire for Improvement  ADL's:  Intact  Cognition: WNL  Sleep:  Good   Screenings:   Assessment and Plan:  KELLYANN ORDWAY is a 59 y.o. year old female with a history of schizoaffective disorder, hypertension , who presents for follow up appointment for below.   1. Schizoaffective disorder, depressive type ([place]) Although there was an episode of paranoia, she reports improvement overall since being  adherent to her medication.  She agrees to continue her current medication.  We will continue Geodon to target schizoaffective disorder.  Discussed potential metabolic side effect and QTC prolongation.  Will continue Depakote for mood dysregulation.  Will continue fluoxetine to target depression.  Will continue trazodone as needed for insomnia.   Plan I have reviewed and updated plans as below 1.ContinueZiprasidone 40 mgin AM and 60 mg at night(QTc 464 msec September 2020) 2.Continue fluoxetine 40 mg daily 3. ContinueDepakote500 mg twice a day(level48.6inMay) - she agrees to contact her PCP to fax the result to [name] (CBC, CMP, VPA) 4. Continue Trazodone 25 mg as needed for sleep - she declined refill 4.Nextappointment: 3/14 at 9AMfor 20 mins, phone - Obtain consent form to discuss with her cousin, Hilda Blades   Past trials of medication:Tofranil, Depakote Geodon, Prozac,   The patient demonstrates the following risk factors for suicide: Chronic risk factors for suicide include: psychiatric disorder of schizoaffective disorder. 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 Protective factorsfor this patient include: hope for the future. Considering these factors, the overall suicide risk at this point appears to be low.She denies gun access at home.Patient isappropriate for outpatient follow up.  [name], MD 08/07/2020, 9:02 AM

## 2020-08-07 ENCOUNTER — Other Ambulatory Visit: Payer: Self-pay

## 2020-08-07 ENCOUNTER — Encounter: Payer: Self-pay | Admitting: Psychiatry

## 2020-08-07 ENCOUNTER — Telehealth (INDEPENDENT_AMBULATORY_CARE_PROVIDER_SITE_OTHER): Payer: 59 | Admitting: Psychiatry

## 2020-08-07 DIAGNOSIS — F251 Schizoaffective disorder, depressive type: Secondary | ICD-10-CM | POA: Diagnosis not present

## 2020-08-07 MED ORDER — FLUOXETINE HCL 40 MG PO CAPS: 40.0000 mg | ORAL_CAPSULE | Freq: Every day | ORAL | 1 refills | Status: DC

## 2020-08-07 MED ORDER — DIVALPROEX SODIUM 500 MG PO DR TAB: 500.0000 mg | DELAYED_RELEASE_TABLET | Freq: Two times a day (BID) | ORAL | 1 refills | Status: DC

## 2020-08-07 MED ORDER — ZIPRASIDONE HCL 40 MG PO CAPS: ORAL_CAPSULE | ORAL | 1 refills | Status: DC

## 2020-08-07 MED ORDER — ZIPRASIDONE HCL 60 MG PO CAPS: ORAL_CAPSULE | ORAL | 1 refills | Status: DC

## 2020-08-10 ENCOUNTER — Telehealth: Payer: Self-pay | Admitting: Psychiatry

## 2020-08-10 NOTE — Telephone Encounter (Signed)
Reviewed labs from 07/11/2020 CBC wnl, CMP WNL, chol 212, TG 165, LDL 123, VPA 67

## 2020-08-18 ENCOUNTER — Telehealth: Payer: Self-pay

## 2020-08-18 NOTE — Telephone Encounter (Signed)
Medication management - Telephone call with pt after she left a message regarding refills of her Geodon orders and Prozac.  Informed pt these 3 orders had already been sent into her [facility] in [place]. Pt. to call back if any problems filling them.

## 2020-09-06 NOTE — Patient Instructions (Signed)
[name] [name]  09/06/2020     @PREFPERIOPPHARMACY @   Your procedure is scheduled on  09/11/2020.   Report to [facility] at  [place].M.   Call this number if you have problems the morning of surgery:  [phone]   Remember:  Follow the diet and prep instructions given to you by the office.                        Take these medicines the morning of surgery with A SIP OF WATER              Depakote, prozac, levothyroxine.   Please brush your teeth.  Do not wear jewelry, make-up or nail polish.  Do not wear lotions, powders, or perfumes, or deodorant.  Do not shave 48 hours prior to surgery.  Men may shave face and neck.  Do not bring valuables to the hospital.  [facility] is not responsible for any belongings or valuables.  Contacts, dentures or bridgework may not be worn into surgery.  Leave your suitcase in the car.  After surgery it may be brought to your room.  For patients admitted to the hospital, discharge time will be determined by your treatment team.  Patients discharged the day of surgery will not be allowed to drive home and must have someone with them for 24 hours.   Special instructions:   DO NOT smoke tobacco or vape the morning of your procedure.   Please read over the following fact sheets that you were given. Anesthesia Post-op Instructions and Care and Recovery After Surgery       Colonoscopy, Adult, Care After This sheet gives you information about how to care for yourself after your procedure. Your health care provider may also give you more specific instructions. If you have problems or questions, contact your health care provider. What can I expect after the procedure? After the procedure, it is common to have:  A small amount of blood in your stool for 24 hours after the procedure.  Some gas.  Mild cramping or bloating of your abdomen. Follow these instructions at home: Eating and drinking  Drink enough fluid to keep your urine  pale yellow.  Follow instructions from your health care provider about eating or drinking restrictions.  Resume your normal diet as instructed by your health care provider. Avoid heavy or fried foods that are hard to digest.   Activity  Rest as told by your health care provider.  Avoid sitting for a long time without moving. Get up to take short walks every 1-2 hours. This is important to improve blood flow and breathing. Ask for help if you feel weak or unsteady.  Return to your normal activities as told by your health care provider. Ask your health care provider what activities are safe for you. Managing cramping and bloating  Try walking around when you have cramps or feel bloated.  Apply heat to your abdomen as told by your health care provider. Use the heat source that your health care provider recommends, such as a moist heat pack or a heating pad. ? Place a towel between your skin and the heat source. ? Leave the heat on for 20-30 minutes. ? Remove the heat if your skin turns bright red. This is especially important if you are unable to feel pain, heat, or cold. You may have a greater risk of getting burned.   General instructions  If you were given a sedative during the procedure, it can affect you for several hours. Do not drive or operate machinery until your health care provider says that it is safe.  For the first 24 hours after the procedure: ? Do not sign important documents. ? Do not drink alcohol. ? Do your regular daily activities at a slower pace than normal. ? Eat soft foods that are easy to digest.  Take over-the-counter and prescription medicines only as told by your health care provider.  Keep all follow-up visits as told by your health care provider. This is important. Contact a health care provider if:  You have blood in your stool 2-3 days after the procedure. Get help right away if you have:  More than a small spotting of blood in your stool.  Large  blood clots in your stool.  Swelling of your abdomen.  Nausea or vomiting.  A fever.  Increasing pain in your abdomen that is not relieved with medicine. Summary  After the procedure, it is common to have a small amount of blood in your stool. You may also have mild cramping and bloating of your abdomen.  If you were given a sedative during the procedure, it can affect you for several hours. Do not drive or operate machinery until your health care provider says that it is safe.  Get help right away if you have a lot of blood in your stool, nausea or vomiting, a fever, or increased pain in your abdomen. This information is not intended to replace advice given to you by your health care provider. Make sure you discuss any questions you have with your health care provider. Document Revised: 07/16/2019 Document Reviewed: 02/15/2019 Elsevier Patient Education  2021 [place] After This sheet gives you information about how to care for yourself after your procedure. Your health care provider may also give you more specific instructions. If you have problems or questions, contact your health care provider. What can I expect after the procedure? After the procedure, it is common to have:  Tiredness.  Forgetfulness about what happened after the procedure.  Impaired judgment for important decisions.  Nausea or vomiting.  Some difficulty with balance. Follow these instructions at home: For the time period you were told by your health care provider:  Rest as needed.  Do not participate in activities where you could fall or become injured.  Do not drive or use machinery.  Do not drink alcohol.  Do not take sleeping pills or medicines that cause drowsiness.  Do not make important decisions or sign legal documents.  Do not take care of children on your own.      Eating and drinking  Follow the diet that is recommended by your health care  provider.  Drink enough fluid to keep your urine pale yellow.  If you vomit: ? Drink water, juice, or soup when you can drink without vomiting. ? Make sure you have little or no nausea before eating solid foods. General instructions  Have a responsible adult stay with you for the time you are told. It is important to have someone help care for you until you are awake and alert.  Take over-the-counter and prescription medicines only as told by your health care provider.  If you have sleep apnea, surgery and certain medicines can increase your risk for breathing problems. Follow instructions from your health care provider about wearing your sleep device: ? Anytime you are sleeping, including during  daytime naps. ? While taking prescription pain medicines, sleeping medicines, or medicines that make you drowsy.  Avoid smoking.  Keep all follow-up visits as told by your health care provider. This is important. Contact a health care provider if:  You keep feeling nauseous or you keep vomiting.  You feel light-headed.  You are still sleepy or having trouble with balance after 24 hours.  You develop a rash.  You have a fever.  You have redness or swelling around the IV site. Get help right away if:  You have trouble breathing.  You have new-onset confusion at home. Summary  For several hours after your procedure, you may feel tired. You may also be forgetful and have poor judgment.  Have a responsible adult stay with you for the time you are told. It is important to have someone help care for you until you are awake and alert.  Rest as told. Do not drive or operate machinery. Do not drink alcohol or take sleeping pills.  Get help right away if you have trouble breathing, or if you suddenly become confused. This information is not intended to replace advice given to you by your health care provider. Make sure you discuss any questions you have with your health care  provider. Document Revised: 04/06/2020 Document Reviewed: 06/24/2019 Elsevier Patient Education  2021 [employer].

## 2020-09-07 ENCOUNTER — Telehealth: Payer: Self-pay

## 2020-09-07 NOTE — Telephone Encounter (Signed)
TCS w/Propofol w/[name] ASA 3 for 09/11/20 has to be rescheduled per endo.  Tried to call pt, LMOAM for return call.

## 2020-09-07 NOTE — Telephone Encounter (Signed)
Called pt. Aware procedure for 2/7 cancelled and we need to r/s. She has been r/s'd to 3/10 am appt. Aware will mail new instructions with new covid/pre-op appt to her. Hospital will call 3 days prior with arrival time.

## 2020-09-08 ENCOUNTER — Encounter (HOSPITAL_COMMUNITY)
Admission: RE | Admit: 2020-09-08 | Discharge: 2020-09-08 | Disposition: A | Discharge status: Home / Self Care | Payer: 59 | Source: Ambulatory Visit | Attending: Internal Medicine | Admitting: Internal Medicine

## 2020-09-08 ENCOUNTER — Encounter: Payer: Self-pay | Admitting: *Deleted

## 2020-09-08 ENCOUNTER — Other Ambulatory Visit (HOSPITAL_COMMUNITY): Admission: RE | Admit: 2020-09-08 | Payer: 59 | Source: Ambulatory Visit

## 2020-09-20 ENCOUNTER — Other Ambulatory Visit: Payer: 59 | Admitting: Adult Health
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
## 2020-10-09 NOTE — Patient Instructions (Signed)
[name] [name]  10/09/2020     @PREFPERIOPPHARMACY @   Your procedure is scheduled on  10/12/2020.   Report to [facility] at  [email or web address].   Call this number if you have problems the morning of surgery:  [phone]   Remember:  Follow the diet and prep instructions given to you by the office.                       Take these medicines the morning of surgery with A SIP OF WATER  Depakote, prozac, levothyroxine, geodon.     Please brush your teeth.  Do not wear jewelry, make-up or nail polish.  Do not wear lotions, powders, or perfumes, or deodorant.  Do not shave 48 hours prior to surgery.  Men may shave face and neck.  Do not bring valuables to the hospital.  [facility] is not responsible for any belongings or valuables.  Contacts, dentures or bridgework may not be worn into surgery.  Leave your suitcase in the car.  After surgery it may be brought to your room.  For patients admitted to the hospital, discharge time will be determined by your treatment team.  Patients discharged the day of surgery will not be allowed to drive home and must have someone with them for 24 hours.   Special instructions:  DO NOT smoke tobacco or vape the morning of your procedure.  Please read over the following fact sheets that you were given. Anesthesia Post-op Instructions and Care and Recovery After Surgery       Colonoscopy, Adult, Care After This sheet gives you information about how to care for yourself after your procedure. Your health care provider may also give you more specific instructions. If you have problems or questions, contact your health care provider. What can I expect after the procedure? After the procedure, it is common to have:  A small amount of blood in your stool for 24 hours after the procedure.  Some gas.  Mild cramping or bloating of your abdomen. Follow these instructions at home: Eating and drinking  Drink enough fluid to keep your urine pale  yellow.  Follow instructions from your health care provider about eating or drinking restrictions.  Resume your normal diet as instructed by your health care provider. Avoid heavy or fried foods that are hard to digest.   Activity  Rest as told by your health care provider.  Avoid sitting for a long time without moving. Get up to take short walks every 1-2 hours. This is important to improve blood flow and breathing. Ask for help if you feel weak or unsteady.  Return to your normal activities as told by your health care provider. Ask your health care provider what activities are safe for you. Managing cramping and bloating  Try walking around when you have cramps or feel bloated.  Apply heat to your abdomen as told by your health care provider. Use the heat source that your health care provider recommends, such as a moist heat pack or a heating pad. ? Place a towel between your skin and the heat source. ? Leave the heat on for 20-30 minutes. ? Remove the heat if your skin turns bright red. This is especially important if you are unable to feel pain, heat, or cold. You may have a greater risk of getting burned.   General instructions  If you were given a sedative during the procedure,  it can affect you for several hours. Do not drive or operate machinery until your health care provider says that it is safe.  For the first 24 hours after the procedure: ? Do not sign important documents. ? Do not drink alcohol. ? Do your regular daily activities at a slower pace than normal. ? Eat soft foods that are easy to digest.  Take over-the-counter and prescription medicines only as told by your health care provider.  Keep all follow-up visits as told by your health care provider. This is important. Contact a health care provider if:  You have blood in your stool 2-3 days after the procedure. Get help right away if you have:  More than a small spotting of blood in your stool.  Large blood  clots in your stool.  Swelling of your abdomen.  Nausea or vomiting.  A fever.  Increasing pain in your abdomen that is not relieved with medicine. Summary  After the procedure, it is common to have a small amount of blood in your stool. You may also have mild cramping and bloating of your abdomen.  If you were given a sedative during the procedure, it can affect you for several hours. Do not drive or operate machinery until your health care provider says that it is safe.  Get help right away if you have a lot of blood in your stool, nausea or vomiting, a fever, or increased pain in your abdomen. This information is not intended to replace advice given to you by your health care provider. Make sure you discuss any questions you have with your health care provider. Document Revised: 07/16/2019 Document Reviewed: 02/15/2019 Elsevier Patient Education  2021 [place] After This sheet gives you information about how to care for yourself after your procedure. Your health care provider may also give you more specific instructions. If you have problems or questions, contact your health care provider. What can I expect after the procedure? After the procedure, it is common to have:  Tiredness.  Forgetfulness about what happened after the procedure.  Impaired judgment for important decisions.  Nausea or vomiting.  Some difficulty with balance. Follow these instructions at home: For the time period you were told by your health care provider:  Rest as needed.  Do not participate in activities where you could fall or become injured.  Do not drive or use machinery.  Do not drink alcohol.  Do not take sleeping pills or medicines that cause drowsiness.  Do not make important decisions or sign legal documents.  Do not take care of children on your own.      Eating and drinking  Follow the diet that is recommended by your health care  provider.  Drink enough fluid to keep your urine pale yellow.  If you vomit: ? Drink water, juice, or soup when you can drink without vomiting. ? Make sure you have little or no nausea before eating solid foods. General instructions  Have a responsible adult stay with you for the time you are told. It is important to have someone help care for you until you are awake and alert.  Take over-the-counter and prescription medicines only as told by your health care provider.  If you have sleep apnea, surgery and certain medicines can increase your risk for breathing problems. Follow instructions from your health care provider about wearing your sleep device: ? Anytime you are sleeping, including during daytime naps. ? While taking prescription pain medicines, sleeping  medicines, or medicines that make you drowsy.  Avoid smoking.  Keep all follow-up visits as told by your health care provider. This is important. Contact a health care provider if:  You keep feeling nauseous or you keep vomiting.  You feel light-headed.  You are still sleepy or having trouble with balance after 24 hours.  You develop a rash.  You have a fever.  You have redness or swelling around the IV site. Get help right away if:  You have trouble breathing.  You have new-onset confusion at home. Summary  For several hours after your procedure, you may feel tired. You may also be forgetful and have poor judgment.  Have a responsible adult stay with you for the time you are told. It is important to have someone help care for you until you are awake and alert.  Rest as told. Do not drive or operate machinery. Do not drink alcohol or take sleeping pills.  Get help right away if you have trouble breathing, or if you suddenly become confused. This information is not intended to replace advice given to you by your health care provider. Make sure you discuss any questions you have with your health care  provider. Document Revised: 04/06/2020 Document Reviewed: 06/24/2019 Elsevier Patient Education  2021 [employer].

## 2020-10-11 ENCOUNTER — Encounter (HOSPITAL_COMMUNITY)
Admission: RE | Admit: 2020-10-11 | Discharge: 2020-10-11 | Disposition: A | Discharge status: Home / Self Care | Payer: 59 | Source: Ambulatory Visit | Attending: Internal Medicine | Admitting: Internal Medicine

## 2020-10-11 ENCOUNTER — Other Ambulatory Visit: Payer: Self-pay

## 2020-10-11 ENCOUNTER — Encounter (HOSPITAL_COMMUNITY): Payer: Self-pay

## 2020-10-11 ENCOUNTER — Other Ambulatory Visit (HOSPITAL_COMMUNITY)
Admission: RE | Admit: 2020-10-11 | Discharge: 2020-10-11 | Disposition: A | Discharge status: Home / Self Care | Payer: 59 | Source: Ambulatory Visit | Attending: Internal Medicine | Admitting: Internal Medicine

## 2020-10-11 ENCOUNTER — Telehealth: Payer: Self-pay | Admitting: *Deleted

## 2020-10-11 DIAGNOSIS — Z01818 Encounter for other preprocedural examination: Secondary | ICD-10-CM | POA: Insufficient documentation

## 2020-10-11 DIAGNOSIS — Z20822 Contact with and (suspected) exposure to covid-19: Secondary | ICD-10-CM | POA: Diagnosis not present

## 2020-10-11 LAB — SARS CORONAVIRUS 2 (TAT 6-24 HRS): SARS Coronavirus 2: NEGATIVE

## 2020-10-11 NOTE — Telephone Encounter (Signed)
Patient last OV 05/09/2020. She is TCS with propofol, ASA 3. Does patient needs OV to reschedule? Thanks

## 2020-10-11 NOTE — Telephone Encounter (Signed)
Communication noted.  

## 2020-10-11 NOTE — Telephone Encounter (Signed)
-----   Message from Jacqulynn Cadet, RN sent at 10/11/2020 10:08 AM EST ----- Regarding: cancellation Patient has not been taking her thyroid medication for 2 weeks.  She ran out of the medication and will have to see Dr Nevada Crane this Saturday to get it refilled.  We will cancel her procedure for now and reschedule after she is on her thyroid medication for about one week.

## 2020-10-11 NOTE — Telephone Encounter (Signed)
Received a call from [facility] in endo stating they got things straighten out at Dr. Juel Burrow office and patient is being left on the schedule for tomorrow. They are calling pt to make aware. Nevermind [name]. I'm sorry.

## 2020-10-11 NOTE — Progress Notes (Signed)
Patient is scheduled for a Colonoscopy tomorrow.  She stopped taking her thyroid medication a week ago.  Her levels are now out of range.  TSH  Level 7.160 and T4 Level is 0.81.  Dr Nevada Crane notified and is okay for her to preceed with the colonoscopy.

## 2020-10-12 ENCOUNTER — Encounter (HOSPITAL_COMMUNITY)
Admission: RE | Disposition: A | Discharge status: Home / Self Care | Payer: Self-pay | Source: Ambulatory Visit | Attending: Internal Medicine

## 2020-10-12 ENCOUNTER — Ambulatory Visit (HOSPITAL_COMMUNITY): Payer: 59 | Admitting: Anesthesiology

## 2020-10-12 ENCOUNTER — Encounter (HOSPITAL_COMMUNITY): Payer: Self-pay | Admitting: Internal Medicine

## 2020-10-12 ENCOUNTER — Ambulatory Visit (HOSPITAL_COMMUNITY)
Admission: RE | Admit: 2020-10-12 | Discharge: 2020-10-12 | Disposition: A | Discharge status: Home / Self Care | Payer: 59 | Source: Ambulatory Visit | Attending: Internal Medicine | Admitting: Internal Medicine

## 2020-10-12 DIAGNOSIS — Z79890 Hormone replacement therapy (postmenopausal): Secondary | ICD-10-CM | POA: Insufficient documentation

## 2020-10-12 DIAGNOSIS — D120 Benign neoplasm of cecum: Secondary | ICD-10-CM | POA: Insufficient documentation

## 2020-10-12 DIAGNOSIS — Z1211 Encounter for screening for malignant neoplasm of colon: Secondary | ICD-10-CM | POA: Insufficient documentation

## 2020-10-12 DIAGNOSIS — K635 Polyp of colon: Secondary | ICD-10-CM | POA: Diagnosis not present

## 2020-10-12 DIAGNOSIS — Z791 Long term (current) use of non-steroidal anti-inflammatories (NSAID): Secondary | ICD-10-CM | POA: Diagnosis not present

## 2020-10-12 DIAGNOSIS — Q438 Other specified congenital malformations of intestine: Secondary | ICD-10-CM | POA: Diagnosis not present

## 2020-10-12 DIAGNOSIS — Z880 Allergy status to penicillin: Secondary | ICD-10-CM | POA: Diagnosis not present

## 2020-10-12 DIAGNOSIS — Z86010 Personal history of colonic polyps: Secondary | ICD-10-CM | POA: Diagnosis not present

## 2020-10-12 HISTORY — PX: COLONOSCOPY WITH PROPOFOL: SHX5780

## 2020-10-12 HISTORY — PX: POLYPECTOMY: SHX149

## 2020-10-12 SURGERY — COLONOSCOPY WITH PROPOFOL
Anesthesia: General

## 2020-10-12 MED ORDER — LACTATED RINGERS IV SOLN
INTRAVENOUS | Status: DC
  Administered 2020-10-12: 1000 mL via INTRAVENOUS

## 2020-10-12 MED ORDER — PROPOFOL 500 MG/50ML IV EMUL
INTRAVENOUS | Status: DC | PRN
  Administered 2020-10-12: 150 ug/kg/min via INTRAVENOUS

## 2020-10-12 MED ORDER — PROPOFOL 10 MG/ML IV BOLUS
INTRAVENOUS | Status: DC | PRN
  Administered 2020-10-12: 100 mg via INTRAVENOUS

## 2020-10-12 MED ORDER — LIDOCAINE HCL (CARDIAC) PF 100 MG/5ML IV SOSY
PREFILLED_SYRINGE | INTRAVENOUS | Status: DC | PRN
  Administered 2020-10-12: 50 mg via INTRAVENOUS

## 2020-10-12 MED ORDER — STERILE WATER FOR IRRIGATION IR SOLN
Status: DC | PRN
  Administered 2020-10-12: 200 mL

## 2020-10-12 NOTE — Transfer of Care (Signed)
Immediate Anesthesia Transfer of Care Note  Patient: [name] [name]  Procedure(s) Performed: COLONOSCOPY WITH PROPOFOL (N/A ) POLYPECTOMY INTESTINAL  Patient Location: Short Stay  Anesthesia Type:General  Level of Consciousness: awake, alert  and oriented  Airway & Oxygen Therapy: Patient Spontanous Breathing  Post-op Assessment: Report given to RN and Post -op Vital signs reviewed and stable  Post vital signs: Reviewed and stable  Last Vitals:  Vitals Value Taken Time  BP    Temp    Pulse    Resp    SpO2      Last Pain:  Vitals:   10/12/20 0832  TempSrc:   PainSc: 0-No pain      Patients Stated Pain Goal: 5 (14/23/95 3202)  Complications: No complications documented.

## 2020-10-12 NOTE — Anesthesia Preprocedure Evaluation (Signed)
Anesthesia Evaluation  Patient identified by MRN, date of birth, ID band Patient awake    Reviewed: Allergy & Precautions, NPO status , Patient's Chart, lab work & pertinent test results  History of Anesthesia Complications Negative for: history of anesthetic complications  Airway Mallampati: III  TM Distance: >3 FB Neck ROM: Full    Dental  (+) Dental Advisory Given [place] :   Pulmonary neg pulmonary ROS,    Pulmonary exam normal breath sounds clear to auscultation       Cardiovascular Exercise Tolerance: Good hypertension, Pt. on medications Normal cardiovascular exam+ Valvular Problems/Murmurs  Rhythm:Regular Rate:Normal  11-Oct-2020 09:12:15 [place] [facility]-OPS ROUTINE RECORD Normal sinus rhythm Normal ECG No significant change since last tracing Confirmed by Ena Dawley [phone]) on 10/11/2020 11:26:19 AM   Neuro/Psych PSYCHIATRIC DISORDERS Anxiety Depression Schizophrenia OCDnegative neurological ROS     GI/Hepatic negative GI ROS, Neg liver ROS,   Endo/Other  negative endocrine ROS  Renal/GU negative Renal ROS     Musculoskeletal negative musculoskeletal ROS (+)   Abdominal   Peds  Hematology negative hematology ROS (+)   Anesthesia Other Findings   Reproductive/Obstetrics negative OB ROS                            Anesthesia Physical Anesthesia Plan  ASA: II  Anesthesia Plan: General   Post-op Pain Management:    Induction: Intravenous  PONV Risk Score and Plan: Propofol infusion and TIVA  Airway Management Planned: Nasal Cannula and Natural Airway  Additional Equipment:   Intra-op Plan:   Post-operative Plan:   Informed Consent: I have reviewed the patients History and Physical, chart, labs and discussed the procedure including the risks, benefits and alternatives for the proposed anesthesia with the patient or authorized representative who has  indicated his/her understanding and acceptance.     Dental advisory given  Plan Discussed with: CRNA and Surgeon  Anesthesia Plan Comments:         Anesthesia Quick Evaluation

## 2020-10-12 NOTE — Op Note (Signed)
[facility] Patient Name: [name] [name] Procedure Date: 10/12/2020 8:22 AM MRN: [id number] Date of Birth: [birth year] Attending MD: Norvel Richards , MD CSN: [id number] Age: 60 Admit Type: Outpatient Procedure:                Colonoscopy Indications:              High risk colon cancer surveillance: Personal                            history of colonic polyps Providers:                Norvel Richards, MD, Makaha Page, [place]                            Risa Grill, Technician Referring MD:              Medicines:                Propofol per Anesthesia Complications:            No immediate complications. Estimated Blood Loss:     Estimated blood loss was minimal. Procedure:                Pre-Anesthesia Assessment:                           - Prior to the procedure, a History and Physical                            was performed, and patient medications and                            allergies were reviewed. The patient's tolerance of                            previous anesthesia was also reviewed. The risks                            and benefits of the procedure and the sedation                            options and risks were discussed with the patient.                            All questions were answered, and informed consent                            was obtained. Prior Anticoagulants: The patient has                            taken no previous anticoagulant or antiplatelet                            agents. ASA Grade Assessment: III - A patient with  severe systemic disease. After reviewing the risks                            and benefits, the patient was deemed in                            satisfactory condition to undergo the procedure.                           After obtaining informed consent, the colonoscope                            was passed under direct vision. Throughout the                            procedure, the  patient's blood pressure, pulse, and                            oxygen saturations were monitored continuously. The                            CF-[id number] ([id number]) scope was introduced through                            the anus and advanced to the the cecum, identified                            by appendiceal orifice and ileocecal valve. The                            colonoscopy was performed without difficulty. The                            patient tolerated the procedure well. The quality                            of the bowel preparation was adequate. Scope In: 8:37:32 AM Scope Out: 8:51:14 AM Scope Withdrawal Time: 0 hours 6 minutes 52 seconds  Total Procedure Duration: 0 hours 13 minutes 42 seconds  Findings:      The perianal and digital rectal examinations were normal. Redundant       colon required external abdominal pressure to reach the cecum.      A 3 mm polyp was found in the cecum. The polyp was sessile. The polyp       was removed with a cold snare. Resection and retrieval were complete.       Estimated blood loss was minimal.      Scattered medium-mouthed diverticula were found in the sigmoid colon and       descending colon.      The exam was otherwise without abnormality on direct and retroflexion       views. Impression:               - One 3 mm polyp in the cecum, removed with a cold  snare. Resected and retrieved.                           - Diverticulosis in the sigmoid colon and in the                            descending colon. Redundant colon                           - The examination was otherwise normal on direct                            and retroflexion views. Moderate Sedation:      Moderate (conscious) sedation was personally administered by an       anesthesia professional. The following parameters were monitored: oxygen       saturation, heart rate, blood pressure, respiratory rate, EKG, adequacy       of pulmonary  ventilation, and response to care. Recommendation:           - Patient has a contact number available for                            emergencies. The signs and symptoms of potential                            delayed complications were discussed with the                            patient. Return to normal activities tomorrow.                            Written discharge instructions were provided to the                            patient.                           - Advance diet as tolerated. Follow-up on                            pathology. Repeat colonoscopy based on path report                            office visit with [name] in 6weeks. Please note patient                            complained of paper hematochezia last year she                            states she has not had any blood per rectum in                            several months now. Procedure Code(s):        --- Professional ---  45385, Colonoscopy, flexible; with removal of                            tumor(s), polyp(s), or other lesion(s) by snare                            technique Diagnosis Code(s):        --- Professional ---                           Z86.010, Personal history of colonic polyps                           K63.5, Polyp of colon                           K57.30, Diverticulosis of large intestine without                            perforation or abscess without bleeding CPT copyright 2019 American Medical Association. All rights reserved. The codes documented in this report are preliminary and upon coder review may  be revised to meet current compliance requirements. Cristopher Estimable. Aradhya Shellenbarger, MD Norvel Richards, MD 10/12/2020 9:00:52 AM This report has been signed electronically. Number of Addenda: 0

## 2020-10-12 NOTE — Discharge Instructions (Addendum)
Colonoscopy Discharge Instructions  Read the instructions outlined below and refer to this sheet in the next few weeks. These discharge instructions provide you with general information on caring for yourself after you leave the hospital. Your doctor may also give you specific instructions. While your treatment has been planned according to the most current medical practices available, unavoidable complications occasionally occur. If you have any problems or questions after discharge, call [name] at [phone]. ACTIVITY  You may resume your regular activity, but move at a slower pace for the next 24 hours.   Take frequent rest periods for the next 24 hours.   Walking will help get rid of the air and reduce the bloated feeling in your belly (abdomen).   No driving for 24 hours (because of the medicine (anesthesia) used during the test).    Do not sign any important legal documents or operate any machinery for 24 hours (because of the anesthesia used during the test).  NUTRITION  Drink plenty of fluids.   You may resume your normal diet as instructed by your doctor.   Begin with a light meal and progress to your normal diet. Heavy or fried foods are harder to digest and may make you feel sick to your stomach (nauseated).   Avoid alcoholic beverages for 24 hours or as instructed.  MEDICATIONS  You may resume your normal medications unless your doctor tells you otherwise.  WHAT YOU CAN EXPECT TODAY  Some feelings of bloating in the abdomen.   Passage of more gas than usual.   Spotting of blood in your stool or on the toilet paper.  IF YOU HAD POLYPS REMOVED DURING THE COLONOSCOPY:  No aspirin products for 7 days or as instructed.   No alcohol for 7 days or as instructed.   Eat a soft diet for the next 24 hours.  FINDING OUT THE RESULTS OF YOUR TEST Not all test results are available during your visit. If your test results are not back during the visit, make an appointment  with your caregiver to find out the results. Do not assume everything is normal if you have not heard from your caregiver or the medical facility. It is important for you to follow up on all of your test results.  SEEK IMMEDIATE MEDICAL ATTENTION IF:  You have more than a spotting of blood in your stool.   Your belly is swollen (abdominal distention).   You are nauseated or vomiting.   You have a temperature over 101.   You have abdominal pain or discomfort that is severe or gets worse throughout the day.    1 polyp removed today  Polyp and diverticulosis information provided  Further recommendations to follow pending review of pathology report  Office visit with Roseanne Kaufman in 4 to 6 weeks  At patient request, I called and Lacinda Axon at [phone] - wrong number   Colon Polyps  Colon polyps are tissue growths inside the colon, which is part of the large intestine. They are one of the types of polyps that can grow in the body. A polyp may be a round bump or a mushroom-shaped growth. You could have one polyp or more than one. Most colon polyps are noncancerous (benign). However, some colon polyps can become cancerous over time. Finding and removing the polyps early can help prevent this. What are the causes? The exact cause of colon polyps is not known. What increases the risk? The following factors may make you more likely to develop  this condition: Having a family history of colorectal cancer or colon polyps. Being older than 60 years of age. Being younger than 60 years of age and having a significant family history of colorectal cancer or colon polyps or a genetic condition that puts you at higher risk of getting colon polyps. Having inflammatory bowel disease, such as ulcerative colitis or Crohn's disease. Having certain conditions passed from parent to child (hereditary conditions), such as: Familial adenomatous polyposis (FAP). Lynch syndrome. Turcot syndrome. Peutz-Jeghers  syndrome. MUTYH-associated polyposis (MAP). Being overweight. Certain lifestyle factors. These include smoking cigarettes, drinking too much alcohol, not getting enough exercise, and eating a diet that is high in fat and red meat and low in fiber. Having had childhood cancer that was treated with radiation of the abdomen. What are the signs or symptoms? Many times, there are no symptoms. If you have symptoms, they may include: Blood coming from the rectum during a bowel movement. Blood in the stool (feces). The blood may be bright red or very dark in color. Pain in the abdomen. A change in bowel habits, such as constipation or diarrhea. How is this diagnosed? This condition is diagnosed with a colonoscopy. This is a procedure in which a lighted, flexible scope is inserted into the opening between the buttocks (anus) and then passed into the colon to examine the area. Polyps are sometimes found when a colonoscopy is done as part of routine cancer screening tests. How is this treated? This condition is treated by removing any polyps that are found. Most polyps can be removed during a colonoscopy. Those polyps will then be tested for cancer. Additional treatment may be needed depending on the results of testing. Follow these instructions at home: Eating and drinking Eat foods that are high in fiber, such as fruits, vegetables, and whole grains. Eat foods that are high in calcium and vitamin D, such as milk, cheese, yogurt, eggs, liver, fish, and broccoli. Limit foods that are high in fat, such as fried foods and desserts. Limit the amount of red meat, precooked or cured meat, or other processed meat that you eat, such as hot dogs, sausages, bacon, or meat loaves. Limit sugary drinks.   Lifestyle Maintain a healthy weight, or lose weight if recommended by your health care provider. Exercise every day or as told by your health care provider. Do not use any products that contain nicotine or  tobacco, such as cigarettes, e-cigarettes, and chewing tobacco. If you need help quitting, ask your health care provider. Do not drink alcohol if: Your health care provider tells you not to drink. You are pregnant, may be pregnant, or are planning to become pregnant. If you drink alcohol: Limit how much you use to: 0-1 drink a day for women. 0-2 drinks a day for men. Know how much alcohol is in your drink. In the [place] one drink equals one 12 oz bottle of beer (355 mL), one 5 oz glass of wine (148 mL), or one 1 oz glass of hard liquor (44 mL). General instructions Take over-the-counter and prescription medicines only as told by your health care provider. Keep all follow-up visits. This is important. This includes having regularly scheduled colonoscopies. Talk to your health care provider about when you need a colonoscopy. Contact a health care provider if: You have new or worsening bleeding during a bowel movement. You have new or increased blood in your stool. You have a change in bowel habits. You lose weight for no known reason. Summary Colon polyps  are tissue growths inside the colon, which is part of the large intestine. They are one type of polyp that can grow in the body. Most colon polyps are noncancerous (benign), but some can become cancerous over time. This condition is diagnosed with a colonoscopy. This condition is treated by removing any polyps that are found. Most polyps can be removed during a colonoscopy. This information is not intended to replace advice given to you by your health care provider. Make sure you discuss any questions you have with your health care provider. Document Revised: 11/10/2019 Document Reviewed: 11/10/2019 Elsevier Patient Education  2021 [place].  Diverticulosis  Diverticulosis is a condition that develops when small pouches (diverticula) form in the wall of the large intestine (colon). The colon is where water is absorbed and stool  (feces) is formed. The pouches form when the inside layer of the colon pushes through weak spots in the outer layers of the colon. You may have a few pouches or many of them. The pouches usually do not cause problems unless they become inflamed or infected. When this happens, the condition is called diverticulitis. What are the causes? The cause of this condition is not known. What increases the risk? The following factors may make you more likely to develop this condition:  Being older than age 10. Your risk for this condition increases with age. Diverticulosis is rare among people younger than age 58. By age 70, many people have it.  Eating a low-fiber diet.  Having frequent constipation.  Being overweight.  Not getting enough exercise.  Smoking.  Taking over-the-counter pain medicines, like aspirin and ibuprofen.  Having a family history of diverticulosis. What are the signs or symptoms? In most people, there are no symptoms of this condition. If you do have symptoms, they may include:  Bloating.  Cramps in the abdomen.  Constipation or diarrhea.  Pain in the lower left side of the abdomen. How is this diagnosed? Because diverticulosis usually has no symptoms, it is most often diagnosed during an exam for other colon problems. The condition may be diagnosed by:  Using a flexible scope to examine the colon (colonoscopy).  Taking an X-ray of the colon after dye has been put into the colon (barium enema).  Having a CT scan. How is this treated? You may not need treatment for this condition. Your health care provider may recommend treatment to prevent problems. You may need treatment if you have symptoms or if you previously had diverticulitis. Treatment may include:  Eating a high-fiber diet.  Taking a fiber supplement.  Taking a live bacteria supplement (probiotic).  Taking medicine to relax your colon.   Follow these instructions at home: Medicines  Take  over-the-counter and prescription medicines only as told by your health care provider.  If told by your health care provider, take a fiber supplement or probiotic. Constipation prevention Your condition may cause constipation. To prevent or treat constipation, you may need to:  Drink enough fluid to keep your urine pale yellow.  Take over-the-counter or prescription medicines.  Eat foods that are high in fiber, such as beans, whole grains, and fresh fruits and vegetables.  Limit foods that are high in fat and processed sugars, such as fried or sweet foods.   General instructions  Try not to strain when you have a bowel movement.  Keep all follow-up visits as told by your health care provider. This is important. Contact a health care provider if you:  Have pain in your  abdomen.  Have bloating.  Have cramps.  Have not had a bowel movement in 3 days. Get help right away if:  Your pain gets worse.  Your bloating becomes very bad.  You have a fever or chills, and your symptoms suddenly get worse.  You vomit.  You have bowel movements that are bloody or black.  You have bleeding from your rectum. Summary  Diverticulosis is a condition that develops when small pouches (diverticula) form in the wall of the large intestine (colon).  You may have a few pouches or many of them.  This condition is most often diagnosed during an exam for other colon problems.  Treatment may include increasing the fiber in your diet, taking supplements, or taking medicines. This information is not intended to replace advice given to you by your health care provider. Make sure you discuss any questions you have with your health care provider. Document Revised: 02/18/2019 Document Reviewed: 02/18/2019 Elsevier Patient Education  [place].

## 2020-10-12 NOTE — Anesthesia Procedure Notes (Signed)
Date/Time: 10/12/2020 8:38 AM Performed by: Orlie Dakin, CRNA Pre-anesthesia Checklist: Patient identified, Emergency Drugs available, Suction available and Patient being monitored Patient Re-evaluated:Patient Re-evaluated prior to induction Oxygen Delivery Method: Nasal cannula Induction Type: IV induction Placement Confirmation: positive ETCO2

## 2020-10-12 NOTE — Anesthesia Postprocedure Evaluation (Signed)
Anesthesia Post Note  Patient: MISHKA STEGEMANN  Procedure(s) Performed: COLONOSCOPY WITH PROPOFOL (N/A ) POLYPECTOMY INTESTINAL  Patient location during evaluation: Phase II Anesthesia Type: General Level of consciousness: awake and alert and oriented Pain management: pain level controlled Vital Signs Assessment: post-procedure vital signs reviewed and stable Respiratory status: spontaneous breathing, nonlabored ventilation and respiratory function stable Cardiovascular status: blood pressure returned to baseline and stable Postop Assessment: no apparent nausea or vomiting Anesthetic complications: no   No complications documented.   Last Vitals:  Vitals:   10/12/20 0746 10/12/20 0857  BP: 122/76 (!) 101/59  Pulse: 78 72  Resp: 20 19  Temp: 36.7 C 36.4 C  SpO2: 95% 95%    Last Pain:  Vitals:   10/12/20 0857  TempSrc: Axillary  PainSc: 0-No pain                 Orlie Dakin

## 2020-10-12 NOTE — H&P (Signed)
@LOGO @   Primary Care Physician:  Celene Squibb, MD Primary Gastroenterologist:  [name]  Pre-Procedure History & Physical: HPI:  [name] [name] is a 60 y.o. female here for colonoscopy to follow-up on rectal bleeding and history of colonic adenoma.  Patient states rectal bleeding has resolved since she was seen in October of last year.  Past Medical History:  Diagnosis Date  . BV (bacterial vaginosis) 01/28/2013  . Essential hypertension, benign   . Hemorrhoids   . Obsessive-compulsive disorders   . Postmenopausal bleeding   . Schizoaffective disorder ([place])   . Tubular adenoma     Past Surgical History:  Procedure Laterality Date  . BUNIONECTOMY    . COLONOSCOPY N/A 07/05/2015   Dr.Taitum Alms- internal hemorrhoids, redundant colon, colionic dierticulosis, colonic polyp. bx= tubular adenoma. next tcs 06/2020.   [name] HEMORRHOID BANDING  2017   Dr.Jhordan Kinter    Prior to Admission medications   Medication Sig Start Date End Date Taking? Authorizing Provider  Ascorbic Acid (VITAMIN C) 1000 MG tablet Take 1,000 mg by mouth daily.   Yes [provider]  Calcium Citrate-Vitamin D (CALCIUM CITRATE + D3 PO) Take 1 tablet by mouth daily.   Yes [provider]  divalproex (DEPAKOTE) 500 MG DR tablet Take 1 tablet (500 mg total) by mouth 2 (two) times daily. 08/11/20  Yes [name], MD  FLUoxetine (PROZAC) 40 MG capsule Take 1 capsule (40 mg total) by mouth daily. 08/12/20  Yes [name], [name], MD  hydrochlorothiazide (HYDRODIURIL) 12.5 MG tablet Take 12.5 mg by mouth daily. 07/19/20  Yes [provider]  ibuprofen (ADVIL,MOTRIN) 200 MG tablet Take 400 mg by mouth 2 (two) times daily as needed for headache.   Yes [provider]  levothyroxine (SYNTHROID) 50 MCG tablet Take 50 mcg by mouth daily before breakfast. 02/13/19  Yes [provider]  Multiple Vitamin (MULTIVITAMIN WITH MINERALS) TABS tablet Take 1 tablet by mouth daily.   Yes [provider]   Omega-3 Fatty Acids (FISH OIL) 1000 MG CAPS Take 1,000 mg by mouth daily.   Yes [provider]  traZODone (DESYREL) 50 MG tablet Take 0.5 tablets (25 mg total) by mouth at bedtime as needed for sleep. Patient taking differently: Take 25-50 mg by mouth at bedtime as needed for sleep. 10/14/19  Yes [name], [name], MD  ziprasidone (GEODON) 40 MG capsule 40 mg in AM, 60 mg in PM , take along with 60 mg cap Patient taking differently: Take 40 mg by mouth daily. In the morning 08/11/20  Yes [name], [name], MD  ziprasidone (GEODON) 60 MG capsule 40 mg in AM, 60 mg in PM. Take along with 40 mg cap Patient taking differently: Take 60 mg by mouth every evening. 08/29/20  Yes [name], [name], MD  benzonatate (TESSALON) 100 MG capsule Take 1 capsule (100 mg total) by mouth every 8 (eight) hours. Patient not taking: Reported on 08/31/2020 06/12/20   Stacey Drain [place], PA-C    Allergies as of 05/30/2020 - Review Complete 05/09/2020  Allergen Reaction Noted  . Penicillins Rash 04/18/2012    Family History  Problem Relation Age of Onset  . Diabetes type II Maternal Grandmother   . Diabetes Maternal Grandmother   . Colon cancer Mother 28  . Hypertension Maternal Aunt     Social History   Socioeconomic History  . Marital status: Divorced    Spouse name: Not on file  . Number of children: Not on file  . Years of education: Not on  file  . Highest education level: Not on file  Occupational History  . Not on file  Tobacco Use  . Smoking status: Never Smoker  . Smokeless tobacco: Never Used  Substance and Sexual Activity  . Alcohol use: No  . Drug use: No  . Sexual activity: Not Currently    Birth control/protection: Post-menopausal  Other Topics Concern  . Not on file  Social History Narrative  . Not on file   Social Determinants of Health   Financial Resource Strain: Not on file  Food Insecurity: Not on file  Transportation Needs: Not on file  Physical Activity: Not on file  Stress:  Not on file  Social Connections: Not on file  Intimate Partner Violence: Not on file    Review of Systems: See HPI, otherwise negative ROS  Physical Exam: BP 122/76   Pulse 78   Temp 98 F (36.7 C) (Oral)   Resp 20   LMP 03/18/2012   SpO2 95%  General:   Alert,  Well-developed, well-nourished, pleasant and cooperative in NAD Neck:  Supple; no masses or thyromegaly. No significant cervical adenopathy. Lungs:  Clear throughout to auscultation.   No wheezes, crackles, or rhonchi. No acute distress. Heart:  Regular rate and rhythm; no murmurs, clicks, rubs,  or gallops. Abdomen: Non-distended, normal bowel sounds.  Soft and nontender without appreciable mass or hepatosplenomegaly.  Pulses:  Normal pulses noted. Extremities:  Without clubbing or edema.  Impression/Plan: 60 year old lady for surveillance colonoscopy.  History colonic adenoma; history of paper hematochezia but none in several months now.  I have offered the patient to surveillance colonoscopy per plan. The risks, benefits, limitations, alternatives and imponderables have been reviewed with the patient. Questions have been answered. All parties are agreeable.      Notice: This dictation was prepared with Dragon dictation along with smaller phrase technology. Any transcriptional errors that result from this process are unintentional and may not be corrected upon review.

## 2020-10-13 ENCOUNTER — Encounter: Payer: Self-pay | Admitting: Internal Medicine

## 2020-10-13 LAB — SURGICAL PATHOLOGY

## 2020-10-16 ENCOUNTER — Telehealth (INDEPENDENT_AMBULATORY_CARE_PROVIDER_SITE_OTHER): Payer: 59 | Admitting: Psychiatry

## 2020-10-16 ENCOUNTER — Other Ambulatory Visit: Payer: Self-pay

## 2020-10-16 ENCOUNTER — Encounter: Payer: Self-pay | Admitting: Psychiatry

## 2020-10-16 DIAGNOSIS — F251 Schizoaffective disorder, depressive type: Secondary | ICD-10-CM | POA: Diagnosis not present

## 2020-10-16 NOTE — Patient Instructions (Addendum)
1.ContinueZiprasidone 40 mgin AM and 60 mg at night 2.Continue fluoxetine 40 mg daily 3. ContinueDepakote500 mg twice a day 4. Continue Trazodone 25 mg as needed for sleep  4.Nextappointment:4/18 at 8 AM, in person visit  CONTACT INFORMATION  What to do if you need to get in touch with someone regarding a psychiatric issue:  1. EMERGENCY: For psychiatric emergencies (if you are suicidal or if there are any other safety issues) call 911 and/or go to your nearest Emergency Room immediately.   2. IF YOU NEED SOMEONE TO TALK TO RIGHT NOW: Given my clinical responsibilities, I may not be able to speak with you over the phone for a prolonged period of time.  A. You may always call The National Suicide Prevention Lifeline at 1-800-273-TALK 867-224-3375).  B. You may walk in to [facility]  Address: [address]. [place], [place] [address], Phone: [phone].  Open 24/7, No appointment required. [place] of residence will also have local crisis services. For [facility]: [place] at [phone] ([place])

## 2020-10-19 ENCOUNTER — Encounter (HOSPITAL_COMMUNITY): Payer: Self-pay | Admitting: Internal Medicine

## 2020-11-10 ENCOUNTER — Other Ambulatory Visit: Payer: 59 | Admitting: Adult Health

## 2020-11-13 ENCOUNTER — Other Ambulatory Visit (HOSPITAL_COMMUNITY): Payer: Self-pay

## 2020-11-13 ENCOUNTER — Other Ambulatory Visit (HOSPITAL_COMMUNITY): Payer: Self-pay | Admitting: Family Medicine

## 2020-11-13 DIAGNOSIS — M545 Low back pain, unspecified: Secondary | ICD-10-CM

## 2020-11-13 DIAGNOSIS — W19XXXA Unspecified fall, initial encounter: Secondary | ICD-10-CM
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
## 2020-11-15 ENCOUNTER — Other Ambulatory Visit (HOSPITAL_COMMUNITY): Payer: Self-pay | Admitting: Family Medicine

## 2020-11-15 ENCOUNTER — Ambulatory Visit (HOSPITAL_COMMUNITY)
Admission: RE | Admit: 2020-11-15 | Discharge: 2020-11-15 | Disposition: A | Discharge status: Home / Self Care | Payer: 59 | Source: Ambulatory Visit | Attending: Family Medicine | Admitting: Family Medicine

## 2020-11-15 DIAGNOSIS — Y929 Unspecified place or not applicable: Secondary | ICD-10-CM | POA: Diagnosis not present

## 2020-11-15 DIAGNOSIS — Y939 Activity, unspecified: Secondary | ICD-10-CM | POA: Diagnosis not present

## 2020-11-15 DIAGNOSIS — W19XXXA Unspecified fall, initial encounter: Secondary | ICD-10-CM | POA: Diagnosis not present

## 2020-11-15 DIAGNOSIS — M545 Low back pain, unspecified: Secondary | ICD-10-CM | POA: Insufficient documentation

## 2020-11-15 DIAGNOSIS — M5136 Other intervertebral disc degeneration, lumbar region: Secondary | ICD-10-CM | POA: Insufficient documentation

## 2020-11-20 ENCOUNTER — Encounter: Payer: Self-pay | Admitting: Psychiatry

## 2020-11-20 ENCOUNTER — Ambulatory Visit (INDEPENDENT_AMBULATORY_CARE_PROVIDER_SITE_OTHER): Payer: 59 | Admitting: Psychiatry

## 2020-11-20 ENCOUNTER — Other Ambulatory Visit: Payer: Self-pay

## 2020-11-20 VITALS — BP 148/88 | HR 85 | Temp 97.8°F | Wt 199.2 lb

## 2020-11-20 DIAGNOSIS — F251 Schizoaffective disorder, depressive type: Secondary | ICD-10-CM | POA: Diagnosis not present

## 2020-11-20 DIAGNOSIS — G4700 Insomnia, unspecified: Secondary | ICD-10-CM | POA: Diagnosis not present

## 2020-11-20 MED ORDER — ZIPRASIDONE HCL 40 MG PO CAPS: 40.0000 mg | ORAL_CAPSULE | Freq: Two times a day (BID) | ORAL | 0 refills | Status: DC

## 2020-11-20 NOTE — Patient Instructions (Signed)
1.DecreaseZiprasidone 40 mgtwice a day 2.Continue fluoxetine 40 mg daily 3. ContinueDepakote500 mg twice a day 4.ContinueTrazodone 25 mg as needed for sleep  4.Nextappointment:in two months

## 2020-11-28 ENCOUNTER — Other Ambulatory Visit: Payer: Self-pay

## 2020-11-28 ENCOUNTER — Ambulatory Visit (INDEPENDENT_AMBULATORY_CARE_PROVIDER_SITE_OTHER): Payer: 59 | Admitting: Gastroenterology

## 2020-11-28 ENCOUNTER — Encounter: Payer: Self-pay | Admitting: Gastroenterology

## 2020-11-28 VITALS — BP 127/71 | HR 82 | Temp 97.1°F | Ht 62.0 in | Wt 201.0 lb

## 2020-11-28 DIAGNOSIS — K625 Hemorrhage of anus and rectum: Secondary | ICD-10-CM | POA: Diagnosis not present

## 2020-11-28 NOTE — Patient Instructions (Signed)
I recommend taking Benefiber 2 teaspoons daily (or generic equivalent) in the beverage of your choice.   We will see you back in 1 year or sooner if needed! Please call if persistent itching, rectal bleeding, etc.  It was a pleasure to see you today. I want to create trusting relationships with patients to provide genuine, compassionate, and quality care. I value your feedback. If you receive a survey regarding your visit,  I greatly appreciate you taking time to fill this out.   Annitta Needs, PhD, ANP-BC [facility]

## 2020-11-28 NOTE — Progress Notes (Signed)
Referring Provider: Celene Squibb, MD Primary Care Physician:  Celene Squibb, MD Primary GI: Dr Gala Romney   Chief Complaint  Patient presents with  . Follow-up    Reports had itching on back side yesterday. Diarrhea started today    HPI:   [name] [name] is a 60 y.o. female presenting today with a history of rectal bleeding when last seen, undergoing colonoscopy March 2022 with one 3 mm polyp in the cecum (tubular adenoma), sigmoid and descending colon diverticulosis. Seven year surveillance due.   Has abdominal cramping and looser stool today. States chocolate flares this up. Enjoys the chocolate pies from [employer]. Usually has a BM every day. Used to take Benefiber. Mild rectal bleeding today. Had a little itching yesterday. No tissue prolapse. Occasional rectal bleeding. Will think about banding.     Past Medical History:  Diagnosis Date  . BV (bacterial vaginosis) 01/28/2013  . Essential hypertension, benign   . Hemorrhoids   . Obsessive-compulsive disorders   . Postmenopausal bleeding   . Schizoaffective disorder ([place])   . Tubular adenoma     Past Surgical History:  Procedure Laterality Date  . BUNIONECTOMY    . COLONOSCOPY N/A 07/05/2015   Dr.Rourk- internal hemorrhoids, redundant colon, colionic dierticulosis, colonic polyp. bx= tubular adenoma. next tcs 06/2020.   [name] COLONOSCOPY WITH PROPOFOL N/A 10/12/2020   one 3 mm polyp in the cecum (tubular adenoma), sigmoid and descending colon diverticulosis.   [name] HEMORRHOID BANDING  2017   Dr.Rourk  . POLYPECTOMY  10/12/2020   Procedure: POLYPECTOMY INTESTINAL;  Surgeon: Daneil Dolin, MD;  [facility];  Service: Endoscopy;;  cecal colon polyp;     Current Outpatient Medications  Medication Sig Dispense Refill  . Ascorbic Acid (VITAMIN C) 1000 MG tablet Take 1,000 mg by mouth daily.    . Calcium Citrate-Vitamin D (CALCIUM CITRATE + D3 PO) Take 1 tablet by mouth daily.    . divalproex (DEPAKOTE) 500 MG DR tablet  Take 1 tablet (500 mg total) by mouth 2 (two) times daily. 180 tablet 1  . FLUoxetine (PROZAC) 40 MG capsule Take 1 capsule (40 mg total) by mouth daily. 90 capsule 1  . hydrochlorothiazide (HYDRODIURIL) 12.5 MG tablet Take 12.5 mg by mouth daily.    [name] ibuprofen (ADVIL,MOTRIN) 200 MG tablet Take 400 mg by mouth 2 (two) times daily as needed for headache.    . levothyroxine (SYNTHROID) 50 MCG tablet Take 50 mcg by mouth daily before breakfast.    . Multiple Vitamin (MULTIVITAMIN WITH MINERALS) TABS tablet Take 1 tablet by mouth daily.    . Omega-3 Fatty Acids (FISH OIL) 1000 MG CAPS Take 1,000 mg by mouth daily.    . ziprasidone (GEODON) 40 MG capsule Take 1 capsule (40 mg total) by mouth 2 (two) times daily with a meal. 180 capsule 0  . ziprasidone (GEODON) 40 MG capsule 40 mg in AM, 60 mg in PM , take along with 60 mg cap (Patient not taking: Reported on 11/28/2020) 90 capsule 1  . ziprasidone (GEODON) 60 MG capsule 40 mg in AM, 60 mg in PM. Take along with 40 mg cap (Patient not taking: Reported on 11/28/2020) 90 capsule 1   No current facility-administered medications for this visit.    Allergies as of 11/28/2020 - Review Complete 11/28/2020  Allergen Reaction Noted  . Penicillins Rash 04/18/2012    Family History  Problem Relation Age of Onset  . Diabetes type II Maternal Grandmother   .  Diabetes Maternal Grandmother   . Colon cancer Mother 61  . Hypertension Maternal Aunt     Social History   Socioeconomic History  . Marital status: Divorced    Spouse name: Not on file  . Number of children: 0  . Years of education: Not on file  . Highest education level: High school graduate  Occupational History  . Not on file  Tobacco Use  . Smoking status: Never Smoker  . Smokeless tobacco: Never Used  Vaping Use  . Vaping Use: Never used  Substance and Sexual Activity  . Alcohol use: No  . Drug use: No  . Sexual activity: Not Currently    Birth control/protection: Post-menopausal   Other Topics Concern  . Not on file  Social History Narrative  . Not on file   Social Determinants of Health   Financial Resource Strain: Not on file  Food Insecurity: Not on file  Transportation Needs: Not on file  Physical Activity: Not on file  Stress: Not on file  Social Connections: Not on file    Review of Systems: Gen: Denies fever, chills, anorexia. Denies fatigue, weakness, weight loss.  CV: Denies chest pain, palpitations, syncope, peripheral edema, and claudication. Resp: Denies dyspnea at rest, cough, wheezing, coughing up blood, and pleurisy. GI: see HPI Derm: Denies rash, itching, dry skin Psych: Denies depression, anxiety, memory loss, confusion. No homicidal or suicidal ideation.  Heme: Denies bruising, bleeding, and enlarged lymph nodes.  Physical Exam: BP 127/71   Pulse 82   Temp (!) 97.1 F (36.2 C)   Ht 5\' 2"  (1.575 m)   Wt 201 lb (91.2 kg)   LMP 03/18/2012   BMI 36.76 kg/m  General:   Alert and oriented. No distress noted. Pleasant and cooperative.  Head:  Normocephalic and atraumatic. Eyes:  Conjuctiva clear without scleral icterus. Mouth:  Mask in place Abdomen:  +BS, soft, non-tender and non-distended. No rebound or guarding. No HSM or masses noted. Msk:  Symmetrical without gross deformities. Normal posture. Extremities:  Without edema. Neurologic:  Alert and  oriented x4 Psych:  Alert and cooperative. Normal mood and affect.  ASSESSMENT: [name] [name] is a 60 y.o. female presenting today in follow-up after colonoscopy for rectal bleeding, with findings of internal hemorrhoids and one adenoma; surveillance will be due in 2029.   Looser stool noted today is likely due to recent chocolate intake, which she states causes diarrhea. We discussed adding Benefiber daily as previously recommended to assist with bowel regimen as she tends to skip days normally.   We briefly discussed banding in the future, but she has rare, infrequent symptoms. If  this were to persist or worsen, she will call.    PLAN:  1 year return Benefiber daily Colonoscopy 2029  Annitta Needs, PhD, [facility]

## 2020-12-04 ENCOUNTER — Telehealth: Payer: Self-pay

## 2020-12-04 NOTE — Telephone Encounter (Signed)
Left message to contact the office. Please ask her which medication she is referring to.

## 2020-12-04 NOTE — Telephone Encounter (Signed)
pt states she wanted to speak with you about taking a over the counter diet pill

## 2020-12-05 NOTE — Telephone Encounter (Signed)
Although she may try this, advise her to limit its use if it causes palpitation, worsening in anxiety or change in her mood as it contains good dose of caffeine.

## 2020-12-05 NOTE — Telephone Encounter (Signed)
pt called back she states the otc is pro-slim

## 2020-12-06 ENCOUNTER — Ambulatory Visit (INDEPENDENT_AMBULATORY_CARE_PROVIDER_SITE_OTHER): Payer: 59 | Admitting: Psychiatry

## 2020-12-06 ENCOUNTER — Other Ambulatory Visit: Payer: Self-pay

## 2020-12-06 DIAGNOSIS — F251 Schizoaffective disorder, depressive type: Secondary | ICD-10-CM | POA: Diagnosis not present
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
## 2021-01-07 DIAGNOSIS — E039 Hypothyroidism, unspecified: Secondary | ICD-10-CM | POA: Insufficient documentation

## 2021-01-07 DIAGNOSIS — E782 Mixed hyperlipidemia: Secondary | ICD-10-CM | POA: Insufficient documentation

## 2021-01-12 DIAGNOSIS — F209 Schizophrenia, unspecified: Secondary | ICD-10-CM

## 2021-01-12 DIAGNOSIS — F411 Generalized anxiety disorder: Secondary | ICD-10-CM | POA: Insufficient documentation

## 2021-01-12 DIAGNOSIS — F5101 Primary insomnia: Secondary | ICD-10-CM | POA: Insufficient documentation

## 2021-01-12 HISTORY — DX: Schizophrenia, unspecified: F20.9

## 2021-01-13 DIAGNOSIS — R7303 Prediabetes: Secondary | ICD-10-CM | POA: Insufficient documentation

## 2021-01-17 ENCOUNTER — Encounter: Payer: Self-pay | Admitting: Adult Health

## 2021-01-17 ENCOUNTER — Ambulatory Visit (INDEPENDENT_AMBULATORY_CARE_PROVIDER_SITE_OTHER): Payer: 59 | Admitting: Adult Health

## 2021-01-17 ENCOUNTER — Other Ambulatory Visit: Payer: Self-pay

## 2021-01-17 ENCOUNTER — Other Ambulatory Visit (HOSPITAL_COMMUNITY)
Admission: RE | Admit: 2021-01-17 | Discharge: 2021-01-17 | Disposition: A | Discharge status: Home / Self Care | Payer: 59 | Source: Ambulatory Visit | Attending: Adult Health | Admitting: Adult Health

## 2021-01-17 VITALS — BP 114/66 | HR 83 | Ht 61.25 in | Wt 202.0 lb

## 2021-01-17 DIAGNOSIS — Z1211 Encounter for screening for malignant neoplasm of colon: Secondary | ICD-10-CM

## 2021-01-17 DIAGNOSIS — Z01419 Encounter for gynecological examination (general) (routine) without abnormal findings: Secondary | ICD-10-CM

## 2021-01-17 HISTORY — DX: Encounter for gynecological examination (general) (routine) without abnormal findings: Z01.419

## 2021-01-17 HISTORY — DX: Encounter for screening for malignant neoplasm of colon: Z12.11

## 2021-01-17 LAB — HEMOCCULT GUIAC POC 1CARD (OFFICE): Fecal Occult Blood, POC: NEGATIVE
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
## 2021-01-19 LAB — CYTOLOGY - PAP
Chlamydia: NEGATIVE
Comment: NEGATIVE
Comment: NEGATIVE
Comment: NORMAL
Diagnosis: NEGATIVE
High risk HPV: NEGATIVE
Neisseria Gonorrhea: NEGATIVE

## 2021-01-22 ENCOUNTER — Telehealth (INDEPENDENT_AMBULATORY_CARE_PROVIDER_SITE_OTHER): Payer: 59 | Admitting: Psychiatry

## 2021-01-22 ENCOUNTER — Other Ambulatory Visit: Payer: Self-pay

## 2021-01-22 ENCOUNTER — Encounter: Payer: Self-pay | Admitting: Psychiatry

## 2021-01-22 DIAGNOSIS — F251 Schizoaffective disorder, depressive type: Secondary | ICD-10-CM | POA: Diagnosis not present

## 2021-01-22 MED ORDER — DIVALPROEX SODIUM 500 MG PO DR TAB
500.0000 mg | DELAYED_RELEASE_TABLET | Freq: Two times a day (BID) | ORAL | 1 refills | Status: DC
Start: 2021-02-04 — End: 2021-07-09

## 2021-01-22 MED ORDER — FLUOXETINE HCL 40 MG PO CAPS: 40.0000 mg | ORAL_CAPSULE | Freq: Every day | ORAL | 1 refills | Status: DC

## 2021-01-22 MED ORDER — ZIPRASIDONE HCL 40 MG PO CAPS: 40.0000 mg | ORAL_CAPSULE | Freq: Two times a day (BID) | ORAL | 0 refills | Status: DC

## 2021-02-20 ENCOUNTER — Telehealth: Payer: Self-pay | Admitting: Internal Medicine

## 2021-02-20 NOTE — Telephone Encounter (Signed)
Call pt. Tried to leave message pt. Voice mail is full.

## 2021-02-20 NOTE — Telephone Encounter (Signed)
[phone]  please call patient, she I having bad diarrhea

## 2021-02-21 NOTE — Telephone Encounter (Signed)
Lmom for pt to call us back. 

## 2021-02-22 NOTE — Telephone Encounter (Signed)
Spoke to pt this morning.  Pt called in on 02/20/2021 but we have been unable to reach her before now.  She informed me that she had diarrhea for 2 days but better now.  She said she changed the way she was taking fiber.  She is taking it before meals now instead of after meals.  Said that seemed to be working better for her.  Routing to Neil Crouch, PA-C as Juluis Rainier in absence of Roseanne Kaufman, NP and [name].

## 2021-02-22 NOTE — Telephone Encounter (Signed)
Pt made aware to call with any further questions or concerns.

## 2021-02-22 NOTE — Telephone Encounter (Signed)
Noted. She can call with any further questions or concerns.

## 2021-03-01 ENCOUNTER — Other Ambulatory Visit (HOSPITAL_COMMUNITY): Payer: Self-pay | Admitting: Internal Medicine

## 2021-03-01 DIAGNOSIS — Z1231 Encounter for screening mammogram for malignant neoplasm of breast: Secondary | ICD-10-CM

## 2021-03-08 ENCOUNTER — Ambulatory Visit (HOSPITAL_COMMUNITY): Payer: 59 | Admitting: Psychiatry

## 2021-03-23 ENCOUNTER — Other Ambulatory Visit: Payer: Self-pay

## 2021-03-23 ENCOUNTER — Encounter: Payer: Self-pay | Admitting: Emergency Medicine

## 2021-03-23 ENCOUNTER — Ambulatory Visit
Admission: EM | Admit: 2021-03-23 | Discharge: 2021-03-23 | Disposition: A | Discharge status: Home / Self Care | Payer: 59

## 2021-03-23 DIAGNOSIS — R220 Localized swelling, mass and lump, head: Secondary | ICD-10-CM

## 2021-03-23 DIAGNOSIS — T50905A Adverse effect of unspecified drugs, medicaments and biological substances, initial encounter: Secondary | ICD-10-CM

## 2021-03-23 MED ORDER — PREDNISONE 20 MG PO TABS: 20.0000 mg | ORAL_TABLET | Freq: Two times a day (BID) | ORAL | 0 refills | Status: AC

## 2021-03-23 NOTE — ED Triage Notes (Signed)
Pt presents today with c/o of swelling inside of mouth on right side that began this afternoon. No SOB. She does report testing positive for Covid over one week ago and has started an antibiotic (name unknown).

## 2021-03-23 NOTE — Discharge Instructions (Addendum)
Prednisone prescribed Continue with benadryl Discontinue antibiotic (cefdinir or omnicef, I have added this medication to your allergies as well) Follow up with PCP as needed Call 911 or go to ER if patient has any new or worsening symptoms such as fever, chills, nausea, vomiting, worsening sore throat, trouble swallowing, difficulty breathing, cough, abdominal pain, chest pain, changes in bowel or bladder habits, etc..[name]
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
## 2021-04-11 ENCOUNTER — Other Ambulatory Visit: Payer: Self-pay

## 2021-04-11 ENCOUNTER — Ambulatory Visit (HOSPITAL_COMMUNITY)
Admission: RE | Admit: 2021-04-11 | Discharge: 2021-04-11 | Disposition: A | Discharge status: Home / Self Care | Payer: 59 | Source: Ambulatory Visit | Attending: Internal Medicine | Admitting: Internal Medicine

## 2021-04-11 DIAGNOSIS — Z1231 Encounter for screening mammogram for malignant neoplasm of breast: Secondary | ICD-10-CM | POA: Diagnosis not present

## 2021-04-11 NOTE — Progress Notes (Signed)
BH MD/PA/NP OP Progress Note  04/16/2021 8:42 AM [name] [name]  MRN:  [id number]  Chief Complaint:  Chief Complaint   Follow-up    HPI:  This is a follow-up appointment for schizoaffective disorder and insomnia.  She states that she lost her friend and uncle.  She talks about this friend, who did not try to come into the hospital.  She misses this person.  She also had a good relationship with her uncle.  Although she has been missing them, she has been able to handle things well.  She also reports good support from her aunt.  She denies any concern at work.  She reports good relationship with her supervisor.  Although there is a lady, who she is "hard to deal with," she tries to ignore this lady, and do her job.  She talks about another person, who she does not like due to his behavior.  However, she states that everybody has their own way, and she tries to ignore it.  She reports good relationship with her boyfriend, [name] [name], who brought her to the clinic today.  She was able to sell her mobile home to her aunt.  She notices that her tremors has been improving since lowering down Geodon.  She feels comfortable to stay on the medication.  Noted that although she initially reports her wish to transfer to other provider, she feels comfortable continuing phone visit at this time especially given lack of psychiatrists in the area.  She denies feeling depressed except she misses her uncle and friend.  She denies anhedonia.  She sleeps well.  She has good appetite.  She denies SI.  She denies AH, VH.  She denies ideas of reference or thought insertion.    Wt Readings from Last 3 Encounters:  04/16/21 198 lb 6.4 oz (90 kg)  01/17/21 202 lb (91.6 kg)  11/28/20 201 lb (91.2 kg)     Visit Diagnosis:    ICD-10-CM   1. Schizoaffective disorder, depressive type (HCC)  F25.1 Valproic acid level    CBC    Basic metabolic panel    2. Insomnia, unspecified type  G47.00       Past Psychiatric History:  Please see initial evaluation for full details. I have reviewed the history. No updates at this time.     Past Medical History:  Past Medical History:  Diagnosis Date   BV (bacterial vaginosis) 01/28/2013   Essential hypertension, benign    Hemorrhoids    Obsessive-compulsive disorders    Postmenopausal bleeding    Schizoaffective disorder ([place])    Tubular adenoma     Past Surgical History:  Procedure Laterality Date   BUNIONECTOMY     COLONOSCOPY N/A 07/05/2015   Dr.Rourk- internal hemorrhoids, redundant colon, colionic dierticulosis, colonic polyp. bx= tubular adenoma. next tcs 06/2020.    COLONOSCOPY WITH PROPOFOL N/A 10/12/2020   one 3 mm polyp in the cecum (tubular adenoma), sigmoid and descending colon diverticulosis.    HEMORRHOID BANDING  2017   Dr.Rourk   POLYPECTOMY  10/12/2020   Procedure: POLYPECTOMY INTESTINAL;  Surgeon: Daneil Dolin, MD;  Location: [facility];  Service: Endoscopy;;  cecal colon polyp;     Family Psychiatric History: Please see initial evaluation for full details. I have reviewed the history. No updates at this time.     Family History:  Family History  Problem Relation Age of Onset   Diabetes type II Maternal Grandmother    Diabetes Maternal Grandmother  Colon cancer Mother 53   Hypertension Maternal Aunt     Social History:  Social History   Socioeconomic History   Marital status: Widowed    Spouse name: Not on file   Number of children: 0   Years of education: Not on file   Highest education level: High school graduate  Occupational History   Not on file  Tobacco Use   Smoking status: Never   Smokeless tobacco: Never  Vaping Use   Vaping Use: Never used  Substance and Sexual Activity   Alcohol use: No   Drug use: No   Sexual activity: Yes    Birth control/protection: Post-menopausal  Other Topics Concern   Not on file  Social History Narrative   Not on file   Social Determinants of Health   Financial Resource  Strain: High Risk   Difficulty of Paying Living Expenses: Hard  Food Insecurity: No Food Insecurity   Worried About Running Out of Food in the Last Year: Never true   Ran Out of Food in the Last Year: Never true  Transportation Needs: No Transportation Needs   Lack of Transportation (Medical): No   Lack of Transportation (Non-Medical): No  Physical Activity: Inactive   Days of Exercise per Week: 0 days   Minutes of Exercise per Session: 20 min  Stress: Stress Concern Present   Feeling of Stress : To some extent  Social Connections: Unknown   Frequency of Communication with Friends and Family: More than three times a week   Frequency of Social Gatherings with Friends and Family: Once a week   Attends Religious Services: Patient refused   Marine scientist or Organizations: No   Attends Archivist Meetings: Never   Marital Status: Widowed    Allergies:  Allergies  Allergen Reactions   Omnicef [Cefdinir] Swelling    Oral swelling   Penicillins Rash    Metabolic Disorder Labs: No results found for: HGBA1C, MPG No results found for: PROLACTIN No results found for: CHOL, TRIG, HDL, CHOLHDL, VLDL, LDLCALC Lab Results  Component Value Date   TSH 4.68 (H) 05/30/2017    Therapeutic Level Labs: No results found for: LITHIUM Lab Results  Component Value Date   VALPROATE 68.2 12/30/2019   VALPROATE 49.5 (L) 06/25/2019   No components found for:  CBMZ  Current Medications: Current Outpatient Medications  Medication Sig Dispense Refill   albuterol (VENTOLIN HFA) 108 (90 Base) MCG/ACT inhaler Inhale into the lungs.     Ascorbic Acid (VITAMIN C) 1000 MG tablet Take 1,000 mg by mouth daily.     Calcium Citrate-Vitamin D (CALCIUM CITRATE + D3 PO) Take 1 tablet by mouth daily.     divalproex (DEPAKOTE) 500 MG DR tablet Take 1 tablet (500 mg total) by mouth 2 (two) times daily. 180 tablet 1   FLUoxetine (PROZAC) 40 MG capsule Take 1 capsule (40 mg total) by mouth  daily. 90 capsule 1   hydrochlorothiazide (HYDRODIURIL) 12.5 MG tablet Take 12.5 mg by mouth daily.     ibuprofen (ADVIL,MOTRIN) 200 MG tablet Take 400 mg by mouth 2 (two) times daily as needed for headache.     levothyroxine (SYNTHROID) 50 MCG tablet Take 50 mcg by mouth daily before breakfast.     Multiple Vitamin (MULTIVITAMIN WITH MINERALS) TABS tablet Take 1 tablet by mouth daily.     Omega-3 Fatty Acids (FISH OIL) 1000 MG CAPS Take 1,000 mg by mouth daily.     ziprasidone (GEODON) 40 MG  capsule Take 1 capsule (40 mg total) by mouth 2 (two) times daily with a meal. 180 capsule 0   No current facility-administered medications for this visit.     Musculoskeletal: Strength & Muscle Tone:  N/A Gait & Station:  N?A Patient leans: N/A  Psychiatric Specialty Exam: Review of Systems  Psychiatric/Behavioral:  Positive for dysphoric mood. Negative for agitation, behavioral problems, confusion, decreased concentration, hallucinations, self-injury, sleep disturbance and suicidal ideas. The patient is not nervous/anxious and is not hyperactive.   All other systems reviewed and are negative.  Blood pressure (!) 144/92, pulse 96, temperature 97.9 F (36.6 C), temperature source Temporal, weight 198 lb 6.4 oz (90 kg), last menstrual period 03/18/2012.Body mass index is 37.18 kg/m.  General Appearance: Fairly Groomed  Eye Contact:  Good  Speech:  Clear and Coherent  Volume:  Normal  Mood:   good  Affect:  Appropriate, Congruent, and euthymic  Thought Process:  Coherent  Orientation:  Full (Time, Place, and Person)  Thought Content: Logical   Suicidal Thoughts:  No  Homicidal Thoughts:  No  Memory:  Immediate;   Good  Judgement:  Good  Insight:  Good  Psychomotor Activity:  Normal no cogwheel rigidity, tremors, tardive dyskinesia  Concentration:  Concentration: Good and Attention Span: Good  Recall:  Good  Fund of Knowledge: Good  Language: Good  Akathisia:  No  Handed:  Right  AIMS  (if indicated): 1 (she reports occasional tongue movement, which is not observed, and she is not in distress)  Assets:  Communication Skills Desire for Improvement  ADL's:  Intact  Cognition: WNL  Sleep:  Good   Screenings: GAD-7    Flowsheet Row Office Visit from 01/17/2021 in [place]  Total GAD-7 Score 7      PHQ2-9    [place] Visit from 04/16/2021 in [place] Office Visit from 01/17/2021 in [place] Visit from 11/20/2020 in [place]  PHQ-2 Total Score 1 1 0  PHQ-9 Total Score -- 12 --      [place] [facility] from 03/23/2021 in [facility] Video Visit from 01/22/2021 in [place] Office Visit from 11/20/2020 in [place] No Risk Low Risk Low Risk        Assessment and Plan:  ABBEGALE ROUGEAU is a 60 y.o. year old female with a history of schizoaffective disorder, hypertension, who presents for follow up appointment for below.   1. Schizoaffective disorder, depressive type ([place]) Although she reports a very slight paranoia like thoughts against others at times, it has been manageable, and she demonstrates linear thought process on today's evaluation.  Psychosocial stressors includes occasional conflict in relationship with a man, who wants to marry her, and the recent loss of her uncle and her friend.  Will continue current dose of Geodon to target schizoaffective disorder.  Noted that there has been significant improvement in cogwheel rigidity since lowering down Geodon.  Will continue fluoxetine to target depression and anxiety.  Will continue Depakote for mood dysregulation.  Will obtain labs to rule out any side effect.    2. Insomnia, unspecified type She reports good benefit from trazodone.  Will continue current dose to target insomnia.   Plan 1. Continue  Ziprasidone 40 mg twice a day (was on 40 mg/60 mg; used to have cogwheel rigidity)  (QTc 446 msec in 10/2020)  2. Continue  fluoxetine 40 mg daily  3. Continue Depakote 500 mg twice a day  4. Continue Trazodone 25 mg as needed for sleep - she declined refill 4. Next appointment: 12/5 at 8 AM , phone 5. Obtain labs (VPA, CMP, CBC) - She is unable to afford therapy with Ms. Bynum - Reviewed labs from 07/11/2020 CBC wnl, CMP WNL, chol 212, TG 165, LDL 123, VPA 67 - She takes Proslim for appetite, which contains vitamins, lactospore probiotics.  no caffeine included in the label   Past trials of medication: Tofranil,  Depakote Geodon, Prozac,   The patient demonstrates the following risk factors for suicide: Chronic risk factors for suicide include: psychiatric disorder of schizoaffective disorder. Acute risk factors for suicide include: social withdrawal/isolation. Protective factors for this patient include: hope for the future. Considering these factors, the overall suicide risk at this point appears to be low. She denies gun access at home. Patient is appropriate for outpatient follow up.        [name], MD 04/16/2021, 8:42 AM

## 2021-04-16 ENCOUNTER — Other Ambulatory Visit
Admission: RE | Admit: 2021-04-16 | Discharge: 2021-04-16 | Disposition: A | Discharge status: Home / Self Care | Payer: 59 | Source: Ambulatory Visit | Attending: Psychiatry | Admitting: Psychiatry

## 2021-04-16 ENCOUNTER — Encounter: Payer: Self-pay | Admitting: Psychiatry

## 2021-04-16 ENCOUNTER — Other Ambulatory Visit: Payer: Self-pay

## 2021-04-16 ENCOUNTER — Ambulatory Visit (INDEPENDENT_AMBULATORY_CARE_PROVIDER_SITE_OTHER): Payer: 59 | Admitting: Psychiatry

## 2021-04-16 VITALS — BP 144/92 | HR 96 | Temp 97.9°F | Wt 198.4 lb

## 2021-04-16 DIAGNOSIS — F251 Schizoaffective disorder, depressive type: Secondary | ICD-10-CM | POA: Insufficient documentation

## 2021-04-16 DIAGNOSIS — G4700 Insomnia, unspecified: Secondary | ICD-10-CM | POA: Diagnosis not present

## 2021-04-16 LAB — CBC
HCT: 41 % (ref 36.0–46.0)
Hemoglobin: 14.3 g/dL (ref 12.0–15.0)
MCH: 31.5 pg (ref 26.0–34.0)
MCHC: 34.9 g/dL (ref 30.0–36.0)
MCV: 90.3 fL (ref 80.0–100.0)
Platelets: 249 10*3/uL (ref 150–400)
RBC: 4.54 MIL/uL (ref 3.87–5.11)
RDW: 13.2 % (ref 11.5–15.5)
WBC: 5.8 10*3/uL (ref 4.0–10.5)
nRBC: 0 % (ref 0.0–0.2)

## 2021-04-16 LAB — HEPATIC FUNCTION PANEL
ALT: 19 U/L (ref 0–44)
AST: 24 U/L (ref 15–41)
Albumin: 3.6 g/dL (ref 3.5–5.0)
Alkaline Phosphatase: 65 U/L (ref 38–126)
Bilirubin, Direct: <0.1 mg/dL (ref 0.0–0.2)
Total Bilirubin: 0.8 mg/dL (ref 0.3–1.2)
Total Protein: 6.8 g/dL (ref 6.5–8.1)

## 2021-04-16 LAB — BASIC METABOLIC PANEL
Anion gap: 9 (ref 5–15)
BUN: 20 mg/dL (ref 6–20)
CO2: 28 mmol/L (ref 22–32)
Calcium: 9.3 mg/dL (ref 8.9–10.3)
Chloride: 99 mmol/L (ref 98–111)
Creatinine, Ser: 0.61 mg/dL (ref 0.44–1.00)
GFR, Estimated: >60 mL/min (ref >60)
Glucose, Bld: 96 mg/dL (ref 70–99)
Potassium: 3.9 mmol/L (ref 3.5–5.1)
Sodium: 136 mmol/L (ref 135–145)

## 2021-04-16 LAB — VALPROIC ACID LEVEL: Valproic Acid Lvl: 56 ug/mL (ref 50.0–100.0)

## 2021-04-16 NOTE — Addendum Note (Signed)
Addended by: Revonda Humphrey on: 04/16/2021 09:01 AM   Modules accepted: Orders

## 2021-04-16 NOTE — Addendum Note (Signed)
Addended by: [name] on: 04/16/2021 10:35 AM   Modules accepted: Orders

## 2021-04-16 NOTE — Patient Instructions (Signed)
1. Continue Ziprasidone 40 mg twice a day 2. Continue fluoxetine 40 mg daily  3. Continue Depakote 500 mg twice a day  4. Continue Trazodone 25 mg as needed for sleep - she declined refill 4. Next appointment: 12/5 at 8 AM , phone 5. Obtain labs (VPA, CMP, CBC)

## 2021-05-21 ENCOUNTER — Telehealth: Payer: Self-pay | Admitting: Internal Medicine

## 2021-05-21 ENCOUNTER — Telehealth: Payer: Self-pay

## 2021-05-21 ENCOUNTER — Other Ambulatory Visit: Payer: Self-pay | Admitting: Psychiatry

## 2021-05-21 MED ORDER — ZIPRASIDONE HCL 40 MG PO CAPS: 40.0000 mg | ORAL_CAPSULE | Freq: Two times a day (BID) | ORAL | 0 refills | Status: DC

## 2021-05-21 NOTE — Telephone Encounter (Signed)
PLEASE CALL PATIENT, SHE STATES THAT WHEN SHE WIPES THERE IS STILL FECAL MATTER NO MATTER HOW MUCH SHE WIPES.

## 2021-05-21 NOTE — Telephone Encounter (Signed)
received fax requesting a refill on the ziprasidone 52mng

## 2021-05-21 NOTE — Telephone Encounter (Signed)
Returned the pt's call LMOVM to return call 

## 2021-05-21 NOTE — Telephone Encounter (Signed)
Ordered

## 2021-05-22 ENCOUNTER — Telehealth: Payer: Self-pay | Admitting: Internal Medicine

## 2021-05-22 NOTE — Telephone Encounter (Signed)
Returned the pt's call and LMOVM to return call 

## 2021-05-22 NOTE — Telephone Encounter (Signed)
See other note

## 2021-05-22 NOTE — Telephone Encounter (Signed)
Patient called again and made an appointment and would also like to try an antidiarrheal medication.  She has taken over the counter medication and it has helped a little. She was unsure of the name of the medication.  Stated that the loose stools will not stop completely no matter what.

## 2021-05-22 NOTE — Telephone Encounter (Signed)
Patient called again and made an appointment and would also like to try an antidiarrheal medication.  She has taken over the counter medication and it has helped a little. She was unsure of the name of the medication.  Stated that the loose stools will not stop completely no matter what.        Note

## 2021-05-23 NOTE — Telephone Encounter (Signed)
Difficult historian. Biggest concern is fecal smearing. Declining fiber. Would be good hemorrhoid banding candidate.  Stacey: Can you have her see me in the future? She is scheduled with Magda Paganini but should be with me. Thanks!

## 2021-05-23 NOTE — Telephone Encounter (Signed)
Pt returned call and advised me that she no longer has diarrhea that she is somewhat constipated (I advised her not having a bowel movement in one day does not constitute for being constipation especially after having diarrhea so long as she stated). Pt states she began having nausea Friday (never mentioned before today) and she is having it now. There is no diarrhea since taking an anti diarrhea medication from [place]. Pt wants to know what is wrong with her and why is this happening. She asks if we can call her cell number [phone]) to speak with her. She also wanted to know "would you be calling her back because she need answers from the Dr."

## 2021-05-23 NOTE — Telephone Encounter (Signed)
noted 

## 2021-05-24 ENCOUNTER — Encounter: Payer: Self-pay | Admitting: Internal Medicine

## 2021-05-28 ENCOUNTER — Telehealth (HOSPITAL_COMMUNITY): Payer: Self-pay | Admitting: Psychiatry

## 2021-05-28 NOTE — Telephone Encounter (Signed)
Returned patients call to schedule f/u, unable to reach did leave a detailed message

## 2021-06-04 ENCOUNTER — Other Ambulatory Visit: Payer: Self-pay

## 2021-06-04 ENCOUNTER — Ambulatory Visit (INDEPENDENT_AMBULATORY_CARE_PROVIDER_SITE_OTHER): Payer: 59 | Admitting: Psychiatry

## 2021-06-04 ENCOUNTER — Telehealth (HOSPITAL_COMMUNITY): Payer: Self-pay | Admitting: Psychiatry

## 2021-06-04 DIAGNOSIS — F251 Schizoaffective disorder, depressive type: Secondary | ICD-10-CM

## 2021-06-04 NOTE — Progress Notes (Signed)
Virtual Visit via Telephone Note  I connected with [name] [name] on 06/04/21 at 1:25 PM EDT  by telephone and verified that I am speaking with the correct person using two identifiers.  Location: Patient: Work Provider: [place] office    I discussed the limitations, risks, security and privacy concerns of performing an evaluation and management service by telephone and the availability of in person appointments. I also discussed with the patient that there may be a patient responsible charge related to this service. The patient expressed understanding and agreed to proceed.     I provided 35  minutes of non-face-to-face time during this encounter.   Alonza Smoker, LCSW   THERAPIST PROGRESS NOTE        Session Time:   Monday 06/04/2021 1:25 PM -  2:00 PM      Participation Level: Active  Behavioral Response: alert, depressed, tearful   Type of Therapy: Individual Therapy  Treatment Goals addressed: Patient wants to have someone to talk to process grief and loss issues related to the death of her ex- husband in January/ alleviate depression  Interventions: Supportive  Summary: [name] is a 60 y.o. female who is a returning patient to this clinician. She  presents with a long-standing history of symptoms of anxiety and depression along with a previous diagnosis of schizoaffective disorder. She  reports a history of at least 3 psychiatric hospitalizations from 39 through 1990.   She has received outpatient services from [facility] and [facility].    Patient last was seen via virtual visit about 5 months ago.  She is seen emergently today as she reports increased stress regarding family conflict and relationship issues.  Per patient's report, she has agreed to sell a family member some of her land but reports feeling pressured to do so.  She is having second thoughts about this but also reports guilt as the family member has complained about having  to make repairs on a trailer that she already purchased from patient.  Patient also reports stress related to the man she is dating asking her for money.  She reports she has given him some small amounts of money but he now is requesting $2000.  Patient expresses frustration with self as she reports difficulty setting and maintaining limits.  She expresses frustration and sadness.  She denies any suicidal thoughts.  However, patient reports increased restlessness and sleep difficulty.  She plans to contact psychiatrist Dr. Modesta Messing for an earlier appointment.  Suicidal/Homicidal: Nowithout intent/plan  Therapist Response:  reviewed symptoms, discussed stressors, facilitated expression of thoughts and feelings, validated feelings, discussed basic personal rights particularly patient having the right to change her mind/delay making a decision until she is ready, assisted patient identify and replace unhelpful thoughts evoking inappropriate guilt,assisted patient identify other possible resources such as legal counsel to advise patient regarding her property, discussed ways to use assertiveness skills to set and maintain limits regarding man she is dating, encouraged patient to follow through with her plans to contact Dr. Modesta Messing for earlier appointment   Plan: Return again in 2-3 weeks  Diagnosis: Axis I: Schizoaffective Disorder        Alonza Smoker, LCSW 06/04/2021

## 2021-06-06 NOTE — Telephone Encounter (Signed)
Opened in error

## 2021-06-20 ENCOUNTER — Telehealth: Payer: Self-pay | Admitting: Internal Medicine

## 2021-06-20 NOTE — Telephone Encounter (Signed)
Patient called and said she is still having bad diarrhea and is unable to control it.  Please advise what she can do

## 2021-06-20 NOTE — Telephone Encounter (Signed)
Lmom for pt to call back. 

## 2021-06-21 NOTE — Telephone Encounter (Signed)
Spoke to pt.  Informed her to add fiber to help bulk up stools. Advised pt to try metamucil.  Informed her that Roseanne Kaufman, NP feels she is having this issue likely due to internal hemorrhoids, causing fecal smearing, etc.  Pt voiced understanding.    She was made aware that Vicente Males recommends appt for hemorrhoid banding.   Erline Levine, please put on that banding day in December and let's move up appt per Vicente Males.

## 2021-06-21 NOTE — Telephone Encounter (Signed)
Recommend adding fiber to help bulk up stools. Can try metamucil.  I feel she is having this issue likely due to internal hemorrhoids, causing fecal smearing, etc.   I recommend appt for hemorrhoid banding. Erline Levine, please put on that banding day in December and let's move up appt.

## 2021-06-21 NOTE — Telephone Encounter (Signed)
Having regular bm's in the morning.  Pt says every time she feels like she has to urinate, that diarrhea slips out.  Pt says this happens at least 2 times or more a day.  No abd pain, and no recent rectal bleeding or dark stools.  Nausea 3 days ago.

## 2021-07-02 ENCOUNTER — Ambulatory Visit (INDEPENDENT_AMBULATORY_CARE_PROVIDER_SITE_OTHER): Payer: 59 | Admitting: Psychiatry

## 2021-07-02 ENCOUNTER — Other Ambulatory Visit: Payer: Self-pay

## 2021-07-02 DIAGNOSIS — F251 Schizoaffective disorder, depressive type: Secondary | ICD-10-CM | POA: Diagnosis not present

## 2021-07-02 NOTE — Progress Notes (Signed)
Virtual Visit via Telephone Note  I connected with BULAH LURIE on 07/02/21 at 8:33 AM  by telephone and verified that I am speaking with the correct person using two identifiers.  Location: Patient: [place] office    I discussed the limitations, risks, security and privacy concerns of performing an evaluation and management service by telephone and the availability of in person appointments. I also discussed with the patient that there may be a patient responsible charge related to this service. The patient expressed understanding and agreed to proceed.    I provided 23 minutes of non-face-to-face time during this encounter.   Alonza Smoker, LCSW  THERAPIST PROGRESS NOTE        Session Time:   Monday 07/02/2021 8:33 AM - 8:56 AM      Participation Level: Active  Behavioral Response: alert, depressed, tearful   Type of Therapy: Individual Therapy  Treatment Goals addressed: Patient wants to have someone to talk to process grief and loss issues related to the death of her ex- husband in January/ alleviate depression  Interventions: Supportive  Summary: GENEIEVE DUELL is a 60 y.o. female who is a returning patient to this clinician. She  presents with a long-standing history of symptoms of anxiety and depression along with a previous diagnosis of schizoaffective disorder. She  reports a history of at least 3 psychiatric hospitalizations from 77 through 1990.   She has received outpatient services from [facility].    Patient last was seen via virtual visit about 4 weeks ago.  She reports experiencing increased stress and frustration as she was pressured into selling her cousin her land per patient's report.  She also expresses frustration as they did not use an attorney as patient wanted but used a notary,who is another one of pt's cousins, instead to witness the transaction.  Patient reports initially being very upset by the  process as she states she really did not know what she was signing.  She expresses disappointment with some of her family members but reports strong support from others.  She reports she is feeling better today as she has accepted the situation and she has forgiven her cousin per her report.  She reports she did give man she was dating $2000 and then ended the relationship.  She reports now being okay and states she will call should she need therapy in the future   Suicidal/Homicidal: Nowithout intent/plan  Therapist Response:  reviewed symptoms, discussed stressors, facilitated expression of thoughts and feelings, validated feelings, agreed patient will call this practice should she need psychotherapy services  in the future.   Plan: Patient is encouraged to call should she need psychotherapy services in the future  Diagnosis: Axis I: Schizoaffective Disorder        Alonza Smoker, LCSW 07/02/2021

## 2021-07-04 NOTE — Progress Notes (Signed)
Virtual Visit via Telephone Note  I connected with [name] [name] on 07/09/21 at  8:00 AM EST by telephone and verified that I am speaking with the correct person using two identifiers.  Location: Patient: home Provider: office Persons participated in the visit- patient, provider    I discussed the limitations, risks, security and privacy concerns of performing an evaluation and management service by telephone and the availability of in person appointments. I also discussed with the patient that there may be a patient responsible charge related to this service. The patient expressed understanding and agreed to proceed.    I discussed the assessment and treatment plan with the patient. The patient was provided an opportunity to ask questions and all were answered. The patient agreed with the plan and demonstrated an understanding of the instructions.   The patient was advised to call back or seek an in-person evaluation if the symptoms worsen or if the condition fails to improve as anticipated.  I provided 14 minutes of non-face-to-face time during this encounter.   [name], MD    [facility] MD/PA/NP OP Progress Note  07/09/2021 8:23 AM [name] [name]  MRN:  [id number]  Chief Complaint:  Chief Complaint   Follow-up; Schizophrenia    HPI:  This is a follow-up appointment for schizoaffective disorder and insomnia.  She states that she sold the land behind her house and the mobile home on it.  Although she did not want to do it, her family pressured her.  She reports frustration about her cousin and aunt, although she feels better now and she decided to forgive them.  She has not been able to go to work due to being sick for the past several days.  She broke up with her boyfriend.  She did not like the way he told her what to do.  She was also not used to getting phone calls.  She feels good about this break-up.  She has occasional insomnia.  She has fair energy.  Although she was feeling  depressed around selling the property, she feels okay now.  She denies change in weight or appetite.  She has occasional difficulty in concentration.  Although she had fleeting passive SI, she denies any intent, plan and denies any SI recently.  She denies paranoia or hallucinations.  She denies decreased need for sleep or euphonia.  She feels comfortable to stay on the medication as it is.    Daily routine:Reading bible, praying feeding animals, meeting with her friend  Employment: [employer],  She used to work full time at [employer] for 21 years, which was closed. Support:cousin/POA, aunt, who lives next door Household: by herself Marital status: divorced  Number of children: 0  Visit Diagnosis:    ICD-10-CM   1. Schizoaffective disorder, depressive type ([place])  F25.1     2. Insomnia, unspecified type  G47.00       Past Psychiatric History: Please see initial evaluation for full details. I have reviewed the history. No updates at this time.     Past Medical History:  Past Medical History:  Diagnosis Date   BV (bacterial vaginosis) 01/28/2013   Essential hypertension, benign    Hemorrhoids    Obsessive-compulsive disorders    Postmenopausal bleeding    Schizoaffective disorder ([place])    Tubular adenoma     Past Surgical History:  Procedure Laterality Date   BUNIONECTOMY     COLONOSCOPY N/A 07/05/2015   Dr.Rourk- internal hemorrhoids, redundant colon, colionic dierticulosis, colonic  polyp. bx= tubular adenoma. next tcs 06/2020.    COLONOSCOPY WITH PROPOFOL N/A 10/12/2020   one 3 mm polyp in the cecum (tubular adenoma), sigmoid and descending colon diverticulosis.    HEMORRHOID BANDING  2017   Dr.Rourk   POLYPECTOMY  10/12/2020   Procedure: POLYPECTOMY INTESTINAL;  Surgeon: Daneil Dolin, MD;  Location: [facility];  Service: Endoscopy;;  cecal colon polyp;     Family Psychiatric History: Please see initial evaluation for full details. I have reviewed the history. No  updates at this time.     Family History:  Family History  Problem Relation Age of Onset   Diabetes type II Maternal Grandmother    Diabetes Maternal Grandmother    Colon cancer Mother 87   Hypertension Maternal Aunt     Social History:  Social History   Socioeconomic History   Marital status: Widowed    Spouse name: Not on file   Number of children: 0   Years of education: Not on file   Highest education level: High school graduate  Occupational History   Not on file  Tobacco Use   Smoking status: Never   Smokeless tobacco: Never  Vaping Use   Vaping Use: Never used  Substance and Sexual Activity   Alcohol use: No   Drug use: No   Sexual activity: Yes    Birth control/protection: Post-menopausal  Other Topics Concern   Not on file  Social History Narrative   Not on file   Social Determinants of Health   Financial Resource Strain: High Risk   Difficulty of Paying Living Expenses: Hard  Food Insecurity: No Food Insecurity   Worried About Running Out of Food in the Last Year: Never true   Ran Out of Food in the Last Year: Never true  Transportation Needs: No Transportation Needs   Lack of Transportation (Medical): No   Lack of Transportation (Non-Medical): No  Physical Activity: Inactive   Days of Exercise per Week: 0 days   Minutes of Exercise per Session: 20 min  Stress: Stress Concern Present   Feeling of Stress : To some extent  Social Connections: Unknown   Frequency of Communication with Friends and Family: More than three times a week   Frequency of Social Gatherings with Friends and Family: Once a week   Attends Religious Services: Patient refused   Marine scientist or Organizations: No   Attends Archivist Meetings: Never   Marital Status: Widowed    Allergies:  Allergies  Allergen Reactions   Omnicef [Cefdinir] Swelling    Oral swelling   Penicillins Rash    Metabolic Disorder Labs: No results found for: HGBA1C, MPG No  results found for: PROLACTIN No results found for: CHOL, TRIG, HDL, CHOLHDL, VLDL, LDLCALC Lab Results  Component Value Date   TSH 4.68 (H) 05/30/2017    Therapeutic Level Labs: No results found for: LITHIUM Lab Results  Component Value Date   VALPROATE 56 04/16/2021   VALPROATE 68.2 12/30/2019   No components found for:  CBMZ  Current Medications: Current Outpatient Medications  Medication Sig Dispense Refill   albuterol (VENTOLIN HFA) 108 (90 Base) MCG/ACT inhaler Inhale into the lungs.     Ascorbic Acid (VITAMIN C) 1000 MG tablet Take 1,000 mg by mouth daily.     Calcium Citrate-Vitamin D (CALCIUM CITRATE + D3 PO) Take 1 tablet by mouth daily.     [START ON 08/04/2021] divalproex (DEPAKOTE) 500 MG DR tablet Take 1 tablet (  500 mg total) by mouth 2 (two) times daily. 180 tablet 1   [START ON 08/07/2021] FLUoxetine (PROZAC) 40 MG capsule Take 1 capsule (40 mg total) by mouth daily. 90 capsule 1   hydrochlorothiazide (HYDRODIURIL) 12.5 MG tablet Take 12.5 mg by mouth daily.     ibuprofen (ADVIL,MOTRIN) 200 MG tablet Take 400 mg by mouth 2 (two) times daily as needed for headache.     levothyroxine (SYNTHROID) 50 MCG tablet Take 50 mcg by mouth daily before breakfast.     Multiple Vitamin (MULTIVITAMIN WITH MINERALS) TABS tablet Take 1 tablet by mouth daily.     Omega-3 Fatty Acids (FISH OIL) 1000 MG CAPS Take 1,000 mg by mouth daily.     [START ON 08/20/2021] ziprasidone (GEODON) 40 MG capsule Take 1 capsule (40 mg total) by mouth 2 (two) times daily with a meal. 180 capsule 0   No current facility-administered medications for this visit.     Musculoskeletal: Strength & Muscle Tone:  N/A Gait & Station:  N/A Patient leans: N/A  Psychiatric Specialty Exam: Review of Systems  Psychiatric/Behavioral:  Positive for decreased concentration, dysphoric mood, sleep disturbance and suicidal ideas. Negative for agitation, behavioral problems, confusion, hallucinations and self-injury. The  patient is nervous/anxious. The patient is not hyperactive.   All other systems reviewed and are negative.  Last menstrual period 03/18/2012.There is no height or weight on file to calculate BMI.  General Appearance: NA  Eye Contact:  NA  Speech:  Clear and Coherent  Volume:  Normal  Mood:   okay now  Affect:  NA  Thought Process:  Coherent  Orientation:  Full (Time, Place, and Person)  Thought Content: Logical   Suicidal Thoughts:  No  Homicidal Thoughts:  No  Memory:  Immediate;   Good  Judgement:  Good  Insight:  Present  Psychomotor Activity:  Normal  Concentration:  Concentration: Good and Attention Span: Good  Recall:  Good  Fund of Knowledge: Good  Language: Good  Akathisia:  No  Handed:  Right  AIMS (if indicated): not done  Assets:  Communication Skills Desire for Improvement  ADL's:  Intact  Cognition: WNL  Sleep:  Poor   Screenings: GAD-7    Flowsheet Row Office Visit from 01/17/2021 in [place]  Total GAD-7 Score 7      PHQ2-9    [place] Office Visit from 04/16/2021 in [place] Office Visit from 01/17/2021 in [place] Visit from 11/20/2020 in [place]  PHQ-2 Total Score 1 1 0  PHQ-9 Total Score -- 12 --      Flowsheet Row ED from 03/23/2021 in [facility] Video Visit from 01/22/2021 in [place] Office Visit from 11/20/2020 in [place] No Risk Low Risk Low Risk        Assessment and Plan:  JYLA HOPF is a 60 y.o. year old female with a history of schizoaffective disorder, hypertension, who presents for follow up appointment for below.    1. Schizoaffective disorder, depressive type [facility]) Although she reports frustration against her family, she denies other significant mood symptoms, and she demonstrates linear thought process on today's  evaluation.  Other psychosocial stressors includes breaking up with a man, and the recent loss of her uncle and her friend.  Will continue current dose of Geodon to target schizoaffective disorder.  Will continue fluoxetine to target depression  and anxiety.  Will continue Depakote for mood dysregulation.   2. Insomnia, unspecified type She reports fair benefit from trazodone.  Will continue the current dose to target insomnia.    Plan 1. Continue Ziprasidone 40 mg twice a day (was on 40 mg/60 mg; used to have cogwheel rigidity)  (QTc 446 msec in 10/2020)  2. Continue fluoxetine 40 mg daily  3. Continue Depakote 500 mg twice a day  4. Continue Trazodone 25 mg as needed for sleep - she declined refill 4. Next appointment: 2/27 at 2:30 for 30 mins, in person Reviewed labs (VPA, CMP, CBC) 04/2021: VPA 56, otherwise wnl  - Reviewed labs from 07/11/2020 CBC wnl, CMP WNL, chol 212, TG 165, LDL 123, VPA 67 - She takes Proslim for appetite, which contains vitamins, lactospore probiotics.  no caffeine included in the label   Past trials of medication: Tofranil,  Depakote Geodon, Prozac,   The patient demonstrates the following risk factors for suicide: Chronic risk factors for suicide include: psychiatric disorder of schizoaffective disorder. Acute risk factors for suicide include: social withdrawal/isolation. Protective factors for this patient include: hope for the future. Considering these factors, the overall suicide risk at this point appears to be low. She denies gun access at home. Patient is appropriate for outpatient follow up.    [name], MD 07/09/2021, 8:23 AM

## 2021-07-09 ENCOUNTER — Encounter: Payer: Self-pay | Admitting: Psychiatry

## 2021-07-09 ENCOUNTER — Telehealth (INDEPENDENT_AMBULATORY_CARE_PROVIDER_SITE_OTHER): Payer: 59 | Admitting: Psychiatry

## 2021-07-09 DIAGNOSIS — G4700 Insomnia, unspecified: Secondary | ICD-10-CM

## 2021-07-09 DIAGNOSIS — F251 Schizoaffective disorder, depressive type: Secondary | ICD-10-CM | POA: Diagnosis not present

## 2021-07-09 MED ORDER — FLUOXETINE HCL 40 MG PO CAPS: 40.0000 mg | ORAL_CAPSULE | Freq: Every day | ORAL | 1 refills | Status: DC

## 2021-07-09 MED ORDER — DIVALPROEX SODIUM 500 MG PO DR TAB: 500.0000 mg | DELAYED_RELEASE_TABLET | Freq: Two times a day (BID) | ORAL | 1 refills | Status: DC

## 2021-07-09 MED ORDER — ZIPRASIDONE HCL 40 MG PO CAPS: 40.0000 mg | ORAL_CAPSULE | Freq: Two times a day (BID) | ORAL | 0 refills | Status: DC

## 2021-07-09 NOTE — Patient Instructions (Signed)
1. Continue Ziprasidone 40 mg twice a day  2. Continue fluoxetine 40 mg daily  3. Continue Depakote 500 mg twice a day  4. Continue Trazodone 25 mg as needed for sleep  4. Next appointment: 2/27 at 2:30, in person  The next visit will be in person visit. Please arrive 15 mins before the scheduled time.   [facility]  Address: [address], [place], [place] [address]

## 2021-07-25 ENCOUNTER — Other Ambulatory Visit: Payer: Self-pay

## 2021-07-25 ENCOUNTER — Ambulatory Visit (INDEPENDENT_AMBULATORY_CARE_PROVIDER_SITE_OTHER): Payer: 59 | Admitting: Gastroenterology

## 2021-07-25 ENCOUNTER — Encounter: Payer: Self-pay | Admitting: Gastroenterology

## 2021-07-25 VITALS — BP 144/83 | HR 86 | Temp 96.9°F | Ht 61.0 in | Wt 194.6 lb

## 2021-07-25 DIAGNOSIS — K648 Other hemorrhoids: Secondary | ICD-10-CM

## 2021-07-25 NOTE — Progress Notes (Signed)
[place] Banding Note:   [name] [name] is a 61 y.o. female presenting today for consideration of hemorrhoid banding. Last colonoscopy March 2022 with one 3 mm polyp in the cecum (tubular adenoma), sigmoid and descending colon diverticulosis. She has bleeding, pressure, itching, leaking, and soiling.    The patient presents with symptomatic grade 1 hemorrhoids, unresponsive to maximal medical therapy, requesting rubber band ligation of her hemorrhoidal disease. All risks, benefits, and alternative forms of therapy were described and informed consent was obtained.   The decision was made to band the left lateral internal hemorrhoid, and the [place] was used to perform band ligation without complication. Digital anorectal examination was then performed to assure proper positioning of the band, and to adjust the banded tissue as required. The patient was discharged home without pain or other issues. Dietary and behavioral recommendations were given and (if necessary prescriptions were given), along with follow-up instructions. The patient will return  for followup and possible additional banding as required.  No complications were encountered and the patient tolerated the procedure well.   Annitta Needs, PhD, ANP-BC [facility]

## 2021-07-25 NOTE — Patient Instructions (Signed)
Avoid straining. Limit toilet time to 2-3 minutes.  We will see you back for banding again in the near future!  Have a Merry Christmas!  I enjoyed seeing you again today! As you know, I value our relationship and want to provide genuine, compassionate, and quality care. I welcome your feedback. If you receive a survey regarding your visit,  I greatly appreciate you taking time to fill this out. See you next time!  Annitta Needs, PhD, ANP-BC [facility]

## 2021-08-23 ENCOUNTER — Telehealth: Payer: Self-pay

## 2021-08-23 NOTE — Telephone Encounter (Signed)
pt states that the geodon is not working for her anymore is there something else she can try.

## 2021-08-23 NOTE — Telephone Encounter (Signed)
Could you contact her to have sooner visit (30 mins) to discuss this. I can see her tomorrow morning for virtual visit if it works for her.

## 2021-08-28 ENCOUNTER — Ambulatory Visit: Payer: 59 | Admitting: Gastroenterology

## 2021-09-16 NOTE — Progress Notes (Addendum)
BH MD/PA/NP OP Progress Note  09/18/2021 12:01 PM [name] [name]  MRN:  [id number]  Chief Complaint:  Chief Complaint   Follow-up    HPI:  This is a follow-up appointment for schizo affective disorder and insomnia.  This appointment was made due to the request of the patient.  She states that she made this appointment due to some incident at work.  She talks about an episode of her dropping a mat while there was trash.  Her coworker commented as if it was a big deal.  Although she can now think of it as not being a big deal, she was concerned that something is wrong with her.  That is why she made this appointment.  She now feels comfortable the way as it is.  She agrees to leave the situation if others make her feel uncomfortable.  She believes her mood has been better.  Although she feels anxious at times, she has been handling things well.  She also reports good support from her aunt.  She sleeps well.  She denies feeling depressed.  She occasionally has anhedonia and low energy.  She feels good about her weight loss since working on her diet.  She denies SI.  She denies AH, VH, paranoia.  She feels comfortable to stay on the medication as it is at this time.    Wt Readings from Last 3 Encounters:  09/18/21 191 lb 12.8 oz (87 kg)  07/25/21 194 lb 9.6 oz (88.3 kg)  04/16/21 198 lb 6.4 oz (90 kg)      Daily routine:Reading bible, praying feeding animals, meeting with her friend  Employment: [employer],  She used to work full time at [employer] for 21 years, which was closed. Support:cousin/POA, aunt, who lives next door Household: by herself Marital status: divorced  Number of children: 0   Wt Readings from Last 3 Encounters:  09/18/21 191 lb 12.8 oz (87 kg)  07/25/21 194 lb 9.6 oz (88.3 kg)  04/16/21 198 lb 6.4 oz (90 kg)    Visit Diagnosis:    ICD-10-CM   1. Schizoaffective disorder, depressive type ([place])  F25.1     2. Insomnia, unspecified type  G47.00       Past  Psychiatric History: Please see initial evaluation for full details. I have reviewed the history. No updates at this time.     Past Medical History:  Past Medical History:  Diagnosis Date   BV (bacterial vaginosis) 01/28/2013   Essential hypertension, benign    Hemorrhoids    Obsessive-compulsive disorders    Postmenopausal bleeding    Schizoaffective disorder ([place])    Tubular adenoma     Past Surgical History:  Procedure Laterality Date   BUNIONECTOMY     COLONOSCOPY N/A 07/05/2015   Dr.Rourk- internal hemorrhoids, redundant colon, colionic dierticulosis, colonic polyp. bx= tubular adenoma. next tcs 06/2020.    COLONOSCOPY WITH PROPOFOL N/A 10/12/2020   one 3 mm polyp in the cecum (tubular adenoma), sigmoid and descending colon diverticulosis.    HEMORRHOID BANDING  2017   Dr.Rourk   POLYPECTOMY  10/12/2020   Procedure: POLYPECTOMY INTESTINAL;  Surgeon: Daneil Dolin, MD;  Location: [facility];  Service: Endoscopy;;  cecal colon polyp;     Family Psychiatric History: Please see initial evaluation for full details. I have reviewed the history. No updates at this time.     Family History:  Family History  Problem Relation Age of Onset   Diabetes type II Maternal Grandmother  Diabetes Maternal Grandmother    Colon cancer Mother 3   Hypertension Maternal Aunt     Social History:  Social History   Socioeconomic History   Marital status: Widowed    Spouse name: Not on file   Number of children: 0   Years of education: Not on file   Highest education level: High school graduate  Occupational History   Not on file  Tobacco Use   Smoking status: Never   Smokeless tobacco: Never  Vaping Use   Vaping Use: Never used  Substance and Sexual Activity   Alcohol use: No   Drug use: No   Sexual activity: Yes    Birth control/protection: Post-menopausal  Other Topics Concern   Not on file  Social History Narrative   Not on file   Social Determinants of Health    Financial Resource Strain: High Risk   Difficulty of Paying Living Expenses: Hard  Food Insecurity: No Food Insecurity   Worried About Running Out of Food in the Last Year: Never true   Ran Out of Food in the Last Year: Never true  Transportation Needs: No Transportation Needs   Lack of Transportation (Medical): No   Lack of Transportation (Non-Medical): No  Physical Activity: Inactive   Days of Exercise per Week: 0 days   Minutes of Exercise per Session: 20 min  Stress: Stress Concern Present   Feeling of Stress : To some extent  Social Connections: Unknown   Frequency of Communication with Friends and Family: More than three times a week   Frequency of Social Gatherings with Friends and Family: Once a week   Attends Religious Services: Patient refused   Marine scientist or Organizations: No   Attends Archivist Meetings: Never   Marital Status: Widowed    Allergies:  Allergies  Allergen Reactions   Omnicef [Cefdinir] Swelling    Oral swelling   Penicillins Rash    Metabolic Disorder Labs: No results found for: HGBA1C, MPG No results found for: PROLACTIN No results found for: CHOL, TRIG, HDL, CHOLHDL, VLDL, LDLCALC Lab Results  Component Value Date   TSH 4.68 (H) 05/30/2017    Therapeutic Level Labs: No results found for: LITHIUM Lab Results  Component Value Date   VALPROATE 56 04/16/2021   VALPROATE 68.2 12/30/2019   No components found for:  CBMZ  Current Medications: Current Outpatient Medications  Medication Sig Dispense Refill   albuterol (VENTOLIN HFA) 108 (90 Base) MCG/ACT inhaler Inhale into the lungs.     Ascorbic Acid (VITAMIN C) 1000 MG tablet Take 1,000 mg by mouth daily.     Calcium Citrate-Vitamin D (CALCIUM CITRATE + D3 PO) Take 1 tablet by mouth daily.     divalproex (DEPAKOTE) 500 MG DR tablet Take 1 tablet (500 mg total) by mouth 2 (two) times daily. 180 tablet 1   FLUoxetine (PROZAC) 40 MG capsule Take 1 capsule (40 mg  total) by mouth daily. 90 capsule 1   hydrochlorothiazide (HYDRODIURIL) 12.5 MG tablet Take 12.5 mg by mouth daily.     ibuprofen (ADVIL,MOTRIN) 200 MG tablet Take 400 mg by mouth 2 (two) times daily as needed for headache.     levothyroxine (SYNTHROID) 50 MCG tablet Take 50 mcg by mouth daily before breakfast.     Multiple Vitamin (MULTIVITAMIN WITH MINERALS) TABS tablet Take 1 tablet by mouth daily.     Omega-3 Fatty Acids (FISH OIL) 1000 MG CAPS Take 1,000 mg by mouth daily.  psyllium (METAMUCIL) 58.6 % packet Take 1 packet by mouth daily.     [START ON 11/18/2021] ziprasidone (GEODON) 40 MG capsule Take 1 capsule (40 mg total) by mouth 2 (two) times daily with a meal. 180 capsule 0   No current facility-administered medications for this visit.     Musculoskeletal: Strength & Muscle Tone: within normal limits Gait & Station: normal Patient leans: N/A  Psychiatric Specialty Exam: Review of Systems  Psychiatric/Behavioral:  Positive for decreased concentration and sleep disturbance. Negative for agitation, behavioral problems, confusion, dysphoric mood, hallucinations, self-injury and suicidal ideas. The patient is nervous/anxious. The patient is not hyperactive.   All other systems reviewed and are negative.  Blood pressure 134/86, pulse 79, temperature (!) 97.4 F (36.3 C), temperature source Temporal, weight 191 lb 12.8 oz (87 kg), last menstrual period 03/18/2012.Body mass index is 36.24 kg/m.  General Appearance: Fairly Groomed  Eye Contact:  Good  Speech:  Clear and Coherent  Volume:  Normal  Mood:   better  Affect:  Appropriate, Congruent, and Full Range  Thought Process:  Coherent  Orientation:  Full (Time, Place, and Person)  Thought Content: Logical   Suicidal Thoughts:  No  Homicidal Thoughts:  No  Memory:  Immediate;   Good  Judgement:  Good  Insight:  Fair  Psychomotor Activity:  Normal, no rigidity, no resting tremor. +postural tremors in her hands   Concentration:  Concentration: Good and Attention Span: Good  Recall:  Good  Fund of Knowledge: Good  Language: Good  Akathisia:  No  Handed:  Right  AIMS (if indicated): 0   Assets:  Communication Skills Desire for Improvement  ADL's:  Intact  Cognition: WNL  Sleep:  Good   Screenings: GAD-7    Flowsheet Row Office Visit from 01/17/2021 in [place]  Total GAD-7 Score 7      PHQ2-9    [place] Office Visit from 09/18/2021 in [place] Office Visit from 04/16/2021 in [place] Office Visit from 01/17/2021 in [place] Office Visit from 11/20/2020 in [place]  PHQ-2 Total Score 1 1 1  0  PHQ-9 Total Score -- -- 12 --      Flowsheet Row ED from 03/23/2021 in [facility] Video Visit from 01/22/2021 in [place] Office Visit from 11/20/2020 in [place] No Risk Low Risk Low Risk        Assessment and Plan:  [name] [name] is a 61 y.o. year old female with a history of  schizoaffective disorder, hypertension, who presents for follow up appointment for below.   1. Schizoaffective disorder, depressive type ([place]) Although there was an incident of her becoming very upset against her coworker, she denies any psychotic symptoms otherwise since the last visit.  Psychosocial stressors includes occasional conflict with her family, breaking up with a man, and the recent loss of her uncle and her friend.  Will continue current dose of Geodon to target schizoaffective disorder.  Will continue fluoxetine to target depression and anxiety.  Will continue Depakote for mood dysregulation.  Noted that although she will benefit from CBT, she is not interested in having regular appointment at this time due to financial strain.   2. Insomnia, unspecified type She reports good  benefit from trazodone.  Will continue current dose to target insomnia.   Plan 1. Continue Ziprasidone 40 mg twice a day (  was on 40 mg/60 mg; used to have cogwheel rigidity)  (QTc 446 msec in 10/2020)  2. Continue fluoxetine 40 mg daily  3. Continue Depakote 500 mg twice a day  4. Continue Trazodone 25 mg as needed for sleep - she declined refill 4. Next appointment: 2/27 at 2:30 for 30 mins, in person Reviewed labs (VPA, LFT, CBC) 04/2021: VPA 56, otherwise wnl  - She takes Proslim for appetite, which contains vitamins, lactospore probiotics.  no caffeine included in the label   Past trials of medication: Tofranil,  Depakote Geodon, Prozac,   The patient demonstrates the following risk factors for suicide: Chronic risk factors for suicide include: psychiatric disorder of schizoaffective disorder. Acute risk factors for suicide include: social withdrawal/isolation. Protective factors for this patient include: hope for the future. Considering these factors, the overall suicide risk at this point appears to be low. She denies gun access at home. Patient is appropriate for outpatient follow up.   Collaboration of Care: Patient refused AEB see therapist only as needed  Consent: Patient/Guardian gives verbal consent for treatment and assignment of benefits for services provided during this telehealth visit. Patient/Guardian expressed understanding and agreed to proceed.    [name], MD 09/18/2021, 12:01 PM

## 2021-09-18 ENCOUNTER — Encounter: Payer: Self-pay | Admitting: Psychiatry

## 2021-09-18 ENCOUNTER — Other Ambulatory Visit: Payer: Self-pay

## 2021-09-18 ENCOUNTER — Ambulatory Visit (INDEPENDENT_AMBULATORY_CARE_PROVIDER_SITE_OTHER): Payer: Self-pay | Admitting: Psychiatry

## 2021-09-18 VITALS — BP 134/86 | HR 79 | Temp 97.4°F | Wt 191.8 lb

## 2021-09-18 DIAGNOSIS — F251 Schizoaffective disorder, depressive type: Secondary | ICD-10-CM

## 2021-09-18 DIAGNOSIS — G4700 Insomnia, unspecified: Secondary | ICD-10-CM

## 2021-09-18 MED ORDER — ZIPRASIDONE HCL 40 MG PO CAPS: 40.0000 mg | ORAL_CAPSULE | Freq: Two times a day (BID) | ORAL | 0 refills | Status: DC

## 2021-09-19 ENCOUNTER — Encounter: Payer: Self-pay | Admitting: Gastroenterology

## 2021-09-19 ENCOUNTER — Ambulatory Visit: Payer: 59 | Admitting: Gastroenterology

## 2021-09-19 VITALS — BP 127/79 | HR 77 | Temp 97.1°F | Ht 62.0 in | Wt 190.2 lb

## 2021-09-19 DIAGNOSIS — K648 Other hemorrhoids: Secondary | ICD-10-CM

## 2021-09-19 NOTE — Progress Notes (Signed)
[facility] Banding Note:   [name] [name] is a 61 y.o. female presenting today for consideration of hemorrhoid banding. Last colonoscopy March 2022 with one 3 mm polyp in the cecum (tubular adenoma), sigmoid and descending colon diverticulosis. She has had banding of the left lateral. She has noted some improvement. Still with bleeding, itching, soiling.    The patient presents with symptomatic grade 1 hemorrhoids, unresponsive to maximal medical therapy, requesting rubber band ligation of his/her hemorrhoidal disease. All risks, benefits, and alternative forms of therapy were described and informed consent was obtained.   The decision was made to band the right posterior internal hemorrhoid, and the [place] was used to perform band ligation without complication. Digital anorectal examination was then performed to assure proper positioning of the band, and to adjust the banded tissue as required. The patient was discharged home without pain or other issues. Dietary and behavioral recommendations were given and (if necessary prescriptions were given), along with follow-up instructions. The patient will return in several weeks for followup and possible additional banding as required.  No complications were encountered and the patient tolerated the procedure well.   Annitta Needs, PhD, ANP-BC [facility]

## 2021-09-19 NOTE — Patient Instructions (Signed)
I will see you back in a few weeks for banding!  Continue to avoid straining.   Limit toilet time to 2-3 minutes at the most.   Avoid constipation.  Please call me with any concerns!  I enjoyed seeing you again today! As you know, I value our relationship and want to provide genuine, compassionate, and quality care. I welcome your feedback. If you receive a survey regarding your visit,  I greatly appreciate you taking time to fill this out. See you next time!  Annitta Needs, PhD, ANP-BC [facility]

## 2021-09-24 ENCOUNTER — Other Ambulatory Visit: Payer: Self-pay

## 2021-09-24 ENCOUNTER — Ambulatory Visit (INDEPENDENT_AMBULATORY_CARE_PROVIDER_SITE_OTHER): Payer: Self-pay | Admitting: Psychiatry

## 2021-09-24 DIAGNOSIS — F251 Schizoaffective disorder, depressive type: Secondary | ICD-10-CM

## 2021-09-24 NOTE — Progress Notes (Signed)
Virtual Visit via Telephone Note  I connected with [name] [name] on 09/24/21 at 8:37 AM EST  by telephone and verified that I am speaking with the correct person using two identifiers.  Location: Patient: Car Provider: [place] office    I discussed the limitations, risks, security and privacy concerns of performing an evaluation and management service by telephone and the availability of in person appointments. I also discussed with the patient that there may be a patient responsible charge related to this service. The patient expressed understanding and agreed to proceed.    I provided 23 minutes of non-face-to-face time during this encounter.   Alonza Smoker, LCSW   THERAPIST PROGRESS NOTE        Session Time:   Monday 09/24/2021 8:37 AM - 9:00 AM        Participation Level: Active  Behavioral Response: alert, depressed, tearful   Type of Therapy: Individual Therapy  Treatment Goals addressed: Patient wants to have someone to talk to process grief and loss issues related to the death of her ex- husband in January/ alleviate depression  Interventions: Supportive  Summary: [name] [name] is a 61 y.o. female who is a returning patient to this clinician. She  presents with a long-standing history of symptoms of anxiety and depression along with a previous diagnosis of schizoaffective disorder. She  reports a history of at least 3 psychiatric hospitalizations from 24 through 1990.   She has received outpatient services from [facility] and [facility].    Patient last was seen via virtual visit about 3 months ago.  She reports continuing to experience stress regarding family issues.  She expresses continued regret she sold some of her property to a cousin.  She expresses frustration regarding a another cousin pressuring her to sell the property.  This cousin is patient's power of attorney per her report.  Patient now has plans to get another power of  attorney as well as another Chief Executive Officer.  She anticipates cousin will be angry and call her.  Patient reports doing okay otherwise.  She denies any significant periods of depression.  She states just getting upset at times but managing well.  She reports having a strong support from another cousin and an aunt.  She has been going out to dinner with a cousin on the weekends and reports enjoying this.  She also reports continuing to work.  Suicidal/Homicidal: Nowithout intent/plan  Therapist Response:  reviewed symptoms, discussed stressors, facilitated expression of thoughts and feelings, validated feelings, discussed basic personal rights, assisted patient identify ways to use assertiveness skills to set and maintain limits with cousin regarding changing POA, encouraged patient to continue involvement in positive relationships   Plan: Return in 4 weeks  Diagnosis: Axis I: Schizoaffective Disorder        Alonza Smoker, LCSW 09/24/2021

## 2021-09-27 ENCOUNTER — Telehealth: Payer: Self-pay

## 2021-09-27 ENCOUNTER — Encounter: Payer: Self-pay | Admitting: Psychiatry

## 2021-09-27 NOTE — Telephone Encounter (Signed)
pt left a message that she needs a letter from you about her last visit.  that she had an appt with you and date and need to signed by you.

## 2021-09-27 NOTE — Telephone Encounter (Signed)
Done. It is in your folder. Please mail it to the patient.

## 2021-10-01 ENCOUNTER — Ambulatory Visit: Payer: 59 | Admitting: Psychiatry

## 2021-10-03 ENCOUNTER — Encounter: Payer: Self-pay | Admitting: Psychiatry

## 2021-10-03 ENCOUNTER — Telehealth: Payer: Self-pay | Admitting: Psychiatry

## 2021-10-03 NOTE — Telephone Encounter (Signed)
Work note mailed to patient ?

## 2021-10-04 ENCOUNTER — Encounter: Payer: Self-pay | Admitting: Internal Medicine

## 2021-10-04 ENCOUNTER — Other Ambulatory Visit: Payer: Self-pay

## 2021-10-04 ENCOUNTER — Ambulatory Visit (INDEPENDENT_AMBULATORY_CARE_PROVIDER_SITE_OTHER): Payer: 59 | Admitting: Gastroenterology

## 2021-10-04 VITALS — BP 108/76 | HR 88 | Temp 97.4°F | Ht 61.0 in | Wt 188.6 lb

## 2021-10-04 DIAGNOSIS — K640 First degree hemorrhoids: Secondary | ICD-10-CM

## 2021-10-04 NOTE — Progress Notes (Signed)
? ? ?  [facility] BANDING PROCEDURE NOTE ? ?[name] [name] is a 60 y.o. female presenting today for consideration of hemorrhoid banding. Last colonoscopy March 2022 with one 3 mm polyp in the cecum (tubular adenoma), sigmoid and descending colon diverticulosis. She has had banding of the left lateral and right posterior. She has noted improvement overall.  ? ? ?The patient presents with symptomatic grade 1 hemorrhoids, unresponsive to maximal medical therapy, requesting rubber band ligation of her hemorrhoidal disease. All risks, benefits, and alternative forms of therapy were described and informed consent was obtained. ? ? ?The decision was made to band the right anterior internal hemorrhoid, and the [place] was used to perform band ligation without complication. Digital anorectal examination was then performed to assure proper positioning of the band, and to adjust the banded tissue as required. The patient was discharged home without pain or other issues. Dietary and behavioral recommendations were given, along with follow-up instructions. The patient will return as needed.  ? ?No complications were encountered and the patient tolerated the procedure well.  ? ?Annitta Needs, PhD, ANP-BC ?[facility]  ? ?

## 2021-10-04 NOTE — Patient Instructions (Signed)
Continue to avoid straining, limit toilet time to 2-3 minutes, and continue fiber. ? ?Please call me with any concerns or issues! ? ?We will see you back as needed! ? ?I enjoyed seeing you again today! As you know, I value our relationship and want to provide genuine, compassionate, and quality care. I welcome your feedback. If you receive a survey regarding your visit,  I greatly appreciate you taking time to fill this out. See you next time! ? ?Annitta Needs, PhD, ANP-BC ?[facility]  ? ? ? ? ? ? ? ? ? ?

## 2021-10-16 ENCOUNTER — Encounter: Payer: 59 | Admitting: Gastroenterology

## 2021-10-21 ENCOUNTER — Other Ambulatory Visit (HOSPITAL_COMMUNITY): Payer: Self-pay | Admitting: Psychiatry

## 2021-10-23 ENCOUNTER — Ambulatory Visit (HOSPITAL_COMMUNITY)
Admission: RE | Admit: 2021-10-23 | Discharge: 2021-10-23 | Disposition: A | Discharge status: Home / Self Care | Payer: 59 | Source: Ambulatory Visit | Attending: Family Medicine | Admitting: Family Medicine

## 2021-10-23 ENCOUNTER — Other Ambulatory Visit (HOSPITAL_COMMUNITY): Payer: Self-pay | Admitting: Family Medicine

## 2021-10-23 ENCOUNTER — Other Ambulatory Visit: Payer: Self-pay

## 2021-10-23 DIAGNOSIS — R112 Nausea with vomiting, unspecified: Secondary | ICD-10-CM | POA: Diagnosis present

## 2021-10-25 ENCOUNTER — Ambulatory Visit (INDEPENDENT_AMBULATORY_CARE_PROVIDER_SITE_OTHER): Payer: Self-pay | Admitting: Psychiatry

## 2021-10-25 ENCOUNTER — Encounter (HOSPITAL_COMMUNITY): Payer: Self-pay

## 2021-10-25 ENCOUNTER — Other Ambulatory Visit: Payer: Self-pay

## 2021-10-25 DIAGNOSIS — F251 Schizoaffective disorder, depressive type: Secondary | ICD-10-CM
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
## 2021-10-25 NOTE — Plan of Care (Signed)
Patient participated in development of plan ?

## 2021-11-02 ENCOUNTER — Emergency Department (HOSPITAL_COMMUNITY): Payer: 59

## 2021-11-02 ENCOUNTER — Emergency Department (HOSPITAL_COMMUNITY)
Admission: EM | Admit: 2021-11-02 | Discharge: 2021-11-02 | Disposition: A | Discharge status: Home / Self Care | Payer: 59 | Attending: Emergency Medicine | Admitting: Emergency Medicine

## 2021-11-02 ENCOUNTER — Encounter (HOSPITAL_COMMUNITY): Payer: Self-pay

## 2021-11-02 ENCOUNTER — Other Ambulatory Visit: Payer: Self-pay

## 2021-11-02 DIAGNOSIS — W109XXA Fall (on) (from) unspecified stairs and steps, initial encounter: Secondary | ICD-10-CM | POA: Insufficient documentation

## 2021-11-02 DIAGNOSIS — S0990XA Unspecified injury of head, initial encounter: Secondary | ICD-10-CM | POA: Diagnosis present

## 2021-11-02 DIAGNOSIS — Y92510 Bank as the place of occurrence of the external cause: Secondary | ICD-10-CM | POA: Diagnosis not present

## 2021-11-02 DIAGNOSIS — S8002XA Contusion of left knee, initial encounter: Secondary | ICD-10-CM | POA: Insufficient documentation

## 2021-11-02 DIAGNOSIS — W19XXXA Unspecified fall, initial encounter: Secondary | ICD-10-CM

## 2021-11-02 DIAGNOSIS — T148XXA Other injury of unspecified body region, initial encounter: Secondary | ICD-10-CM

## 2021-11-02 DIAGNOSIS — S0191XA Laceration without foreign body of unspecified part of head, initial encounter: Secondary | ICD-10-CM | POA: Diagnosis not present

## 2021-11-02 DIAGNOSIS — S60511A Abrasion of right hand, initial encounter: Secondary | ICD-10-CM | POA: Insufficient documentation

## 2021-11-02 NOTE — Discharge Instructions (Signed)
Please take Ibuprofen (Advil, motrin) and Tylenol (acetaminophen) to relieve your pain.   ? ?You may take up to 600 MG (3 pills) of normal strength ibuprofen every 8 hours as needed.   ?You make take tylenol, up to 1,000 mg (two extra strength pills) every 8 hours as needed.  ? ?It is safe to take ibuprofen and tylenol at the same time as they work differently.  ? Do not take more than 3,000 mg tylenol in a 24 hour period (not more than one dose every 8 hours.  Please check all medication labels as many medications such as pain and cold medications may contain tylenol.  Do not drink alcohol while taking these medications.  Do not take other NSAID'S while taking ibuprofen (such as aleve or naproxen).  Please take ibuprofen with food to decrease stomach upset. ? ?

## 2021-11-02 NOTE — ED Triage Notes (Signed)
Patient reports missed a step and landed on knees and has small abrasion/lac to upper lip.  Complains of left knee pain and right ankle pain. Denies LOC or hitting head.  Patient keeps calling family in triage so getting questions answered is difficult ?

## 2021-11-02 NOTE — ED Notes (Signed)
Patient talking on cellphone so unable to triage at this time.  ?

## 2021-11-02 NOTE — ED Provider Notes (Signed)
?[place] ?Provider Note ? ? ?CSN: [id number] ?Arrival date & time: 11/02/21  1655 ? ?  ? ?History ? ?Chief Complaint  ?Patient presents with  ? Fall  ? ? ?[name] [name] is a 61 y.o. female with a past medical history of schizoaffective disorder, anxiety, prolonged QT interval, who presents today for evaluation after mechanical nonsyncopal fall.  She states that [place] was at the [place] and tripped and fell.  She denies any prodromal symptoms or loss of consciousness.  No chest pain.  She is not sure what she struck her upper lip on.  She does not take any blood thinners.  She denies any pain in her head or neck. ? ?She feels like her teeth and jaw are aligning normally.  She reports mild pain in her right hand from catching herself.  Her area of worst pain is her left knee which she reports is swollen.  She does not know her last tetanus shot. ?Review shows last tetanus shot was 05/24/2014. ? ?HPI ? ?  ? ?Home Medications ?Prior to Admission medications   ?Medication Sig Start Date End Date Taking? Authorizing Provider  ?Ascorbic Acid (VITAMIN C) 1000 MG tablet Take 1,000 mg by mouth daily.    [provider]  ?Calcium Citrate-Vitamin D (CALCIUM CITRATE + D3 PO) Take 1 tablet by mouth daily.    [provider]  ?divalproex (DEPAKOTE) 500 MG DR tablet Take 1 tablet (500 mg total) by mouth 2 (two) times daily. 08/04/21 01/31/22  [name], MD  ?FLUoxetine (PROZAC) 40 MG capsule Take 1 capsule (40 mg total) by mouth daily. 08/07/21 02/03/22  [name], MD  ?hydrochlorothiazide (HYDRODIURIL) 12.5 MG tablet Take 12.5 mg by mouth daily. 07/19/20   [provider]  ?ibuprofen (ADVIL,MOTRIN) 200 MG tablet Take 400 mg by mouth 2 (two) times daily as needed for headache.    [provider]  ?levothyroxine (SYNTHROID) 50 MCG tablet Take 50 mcg by mouth daily before breakfast. 02/13/19   [provider]  ?loperamide (IMODIUM) 2 MG capsule Take by mouth as needed  for diarrhea or loose stools.    [provider]  ?Multiple Vitamin (MULTIVITAMIN WITH MINERALS) TABS tablet Take 1 tablet by mouth daily. ?Patient not taking: Reported on 10/04/2021    [provider]  ?Omega-3 Fatty Acids (FISH OIL) 1000 MG CAPS Take 1,000 mg by mouth daily.    [provider]  ?psyllium (METAMUCIL) 58.6 % packet Take 1 packet by mouth daily.    [provider]  ?traZODone (DESYREL) 50 MG tablet Take 0.5 tablets (25 mg total) by mouth at bedtime as needed for sleep. 10/22/21 01/20/22  [name], MD  ?ziprasidone (GEODON) 40 MG capsule Take 1 capsule (40 mg total) by mouth 2 (two) times daily with a meal. 11/18/21 02/16/22  [name], MD  ?   ? ?Allergies    ?Omnicef [cefdinir] and Penicillins   ? ?Review of Systems   ?Review of Systems ? ?Physical Exam ?Updated Vital Signs ?BP 128/79 (BP Location: Right Arm)   Pulse 65   Temp 98.4 ?F (36.9 ?C) (Oral)   Resp 18   Ht '5\' 1"'$  (1.549 m)   Wt 84.4 kg   LMP 03/18/2012   SpO2 99%   BMI 35.14 kg/m?  ?Physical Exam ?Vitals and nursing note reviewed.  ?Constitutional:   ?   General: She is not in acute distress. ?HENT:  ?   Head: Normocephalic.  ?   Comments:  There is a 1 to 2 cm very superficial abrasion/laceration over the philtrum.  There is no active bleeding, wound does not gape.  There is a subcentimeter abrasion over the left upper lip, not crossing the labial line. ?No trismus, no pain with gentle range of motion.  No tenderness to palpation over nose, midface, or forehead. ?No raccoon's eyes or battle signs bilaterally. ?Neck:  ?   Comments: No midline tenderness to palpation, step-offs, or deformities.  Is able to turn her head past 45 degrees bilaterally without pain. ?Cardiovascular:  ?   Rate and Rhythm: Normal rate.  ?Pulmonary:  ?   Effort: Pulmonary effort is normal. No respiratory distress.  ?Musculoskeletal:  ?   Cervical back: Normal range of motion and neck supple. No rigidity.  ?   Comments:  Left knee with moderate edema.  ?Skin: ?   Comments: Contusion on left knee with scant superficial abrasion.  There are superficial abrasions over the right palm.  ?Neurological:  ?   Mental Status: She is alert. Mental status is at baseline.  ?   Comments: Awake and alert, answers all questions appropriately.  Speech is not slurred.    ?Psychiatric:     ?   Mood and Affect: Mood normal.  ? ? ?ED Results / Procedures / Treatments   ?Labs ?(all labs ordered are listed, but only abnormal results are displayed) ?Labs Reviewed - No data to display ? ?EKG ?None ? ?Radiology ?DG Knee Complete 4 Views Left ? ?Result Date: 11/02/2021 ?CLINICAL DATA:  Fall, pain EXAM: LEFT KNEE - COMPLETE 4+ VIEW COMPARISON:  11/15/2020 FINDINGS: No evidence of fracture, dislocation, or joint effusion. No evidence of arthropathy or other focal bone abnormality. Soft tissues are unremarkable. IMPRESSION: Negative. Electronically Signed   By: Rolm Baptise M.D.   On: 11/02/2021 19:01   ? ?Procedures ?Procedures  ? ? ?Medications Ordered in ED ?Medications - No data to display ? ?ED Course/ Medical Decision Making/ A&P ?  ?                        ?Medical Decision Making ?Patient presents today for evaluation after mechanical nonsyncopal fall when she missed a step.  She does not take any blood thinning medications, she denies striking her head or passing out, no pain in her head or neck.  Her primary area of concern is her left knee which is bruised and swollen.  She has been ambulatory since the fall.  X-rays of the knee are obtained without fracture or other acute abnormality.  She declined crutches or bracing. ? ?She did sustain a superficial abrasion on her upper lip, however she denies any pain in her head, no loss of consciousness.  We discussed role of imaging of her head and her neck however she does not have any complaints or concerns in this area and therefore declined. ?Recommended OTC medications as needed for pain.  Recommended  general conservative wound care for her abrasions that are all superficial and none of which require primary repair including the one on her face. ?Tetanus shot was in 2015, her wounds are exceptionally tetanus prone report does not need to be updated.  She is hemodynamically stable. ? ?Return precautions were discussed with patient who states their understanding.  At the time of discharge patient denied any unaddressed complaints or concerns.  Patient is agreeable for discharge home. ? ?Note: Portions of this report may have been transcribed using voice recognition software.  Every effort was made to ensure accuracy; however, inadvertent computerized transcription errors may be present ? ? ? ? ?Amount and/or Complexity of Data Reviewed ?Radiology: ordered. ? ?Risk ?OTC drugs. ?Decision regarding hospitalization. ? ? ? ? ? ? ? ? ? ? ?Final Clinical Impression(s) / ED Diagnoses ?Final diagnoses:  ?Fall, initial encounter  ?Contusion of left knee, initial encounter  ?Abrasion  ? ? ?Rx / DC Orders ?ED Discharge Orders   ? ? None  ? ?  ? ? ?  ?Lorin Glass, [place] ?11/03/21 1558 ? ?  ?Davonna Belling, MD ?11/05/21 [phone] ? ?

## 2021-11-02 NOTE — ED Notes (Signed)
Pt ambulated to BR with steady gait, no assistance needed, no difficulty reported or observed  ?

## 2021-11-19 ENCOUNTER — Encounter: Payer: Self-pay | Admitting: Internal Medicine

## 2021-11-22 ENCOUNTER — Ambulatory Visit (INDEPENDENT_AMBULATORY_CARE_PROVIDER_SITE_OTHER): Payer: Self-pay | Admitting: Psychiatry

## 2021-11-22 DIAGNOSIS — F251 Schizoaffective disorder, depressive type: Secondary | ICD-10-CM

## 2021-11-22 NOTE — Progress Notes (Signed)
Virtual Visit via Video Note ? ?I connected with [name] [name] on 11/22/21 at  4:00 PM EDT by a video enabled telemedicine application and verified that I am speaking with the correct person using two identifiers. ? ?Location: ?Patient: Home ?Provider: [facility] [place] [facility]  ?  ?I discussed the limitations of evaluation and management by telemedicine and the availability of in person appointments. The patient expressed understanding and agreed to proceed. ? ? ?I provided 48 minutes of non-face-to-face time during this encounter. ? ? ?[name] [name] E [name] Nass, LCSW ? ? ?THERAPIST PROGRESS NOTE ? ? ? ?   ? ?Session Time:   Thursday  11/22/2021 4:12 PM - 4:50 PM  ?    ?Participation Level: Active ? ?Behavioral Response: alert, depressed, tearful  ? ?Type of Therapy: Individual Therapy ? ?Treatment Goals addressed: [name] [name] will score less than 10 on the patient health questionnaire ? ?Progress on goals: Progressing ? ?Interventions: Supportive ? ?Summary: [name] [name] is a 61 y.o. female who is a returning patient to this clinician. She  presents with a long-standing history of symptoms of anxiety and depression along with a previous diagnosis of schizoaffective disorder. She  reports a history of at least 3 psychiatric hospitalizations from 37 through 1990.   She has received outpatient services from [facility].  ? ? ?Patient last was seen via virtual visit about a month ago.  She reports decreased symptoms of depression as reflected in PHQ-9.  She reports less stress regarding selling her property and her relationship with her family.  She states letting things go.  She is trying to become more involved in activities and reports going to church recently she also reports more interaction with her family.  He also is excited about another friendship she recently developed with an 61 year old female who considers patient like a daughter per patient's report.  Patient continues to sometimes become  upset when she thinks others may have negative thoughts about her but also states that she knows that it is just her mind telling her at this and that others may not be thinking that way about her.   ? ?Suicidal/Homicidal: Nowithout intent/plan ? ?Therapist Response:  reviewed symptoms, administered PHQ-9 praised and reinforced patient's increased involvement in activity and increased socialization, discussed effects, discussed stressors, facilitated expression of thoughts and feelings, validated feelings, discussed the effects of involvement in pleasurable activities and overcoming depression, developed plan with patient to participate in 2 pleasant activities per week, will send patient activity menu via mail to assist her in her efforts,. ? ?Plan: Return in 3-4 weeks ? ?Diagnosis: Axis I: Schizoaffective Disorder ? ?   ?Collaboration of Care: Psychiatrist AEB patient works with psychiatrist Dr. Modesta Messing ? ?Patient/Guardian was advised Release of Information must be obtained prior to any record release in order to collaborate their care with an outside provider. Patient/Guardian was advised if they have not already done so to contact the registration department to sign all necessary forms in order for [name] to release information regarding their care.  ? ?Consent: Patient/Guardian gives verbal consent for treatment and assignment of benefits for services provided during this visit. Patient/Guardian expressed understanding and agreed to proceed.  ? ? ?[name] [name] E [name] Venuti, LCSW ?11/22/2021 ? ? ?

## 2021-11-26 ENCOUNTER — Encounter: Payer: Self-pay | Admitting: Internal Medicine

## 2021-11-27 ENCOUNTER — Emergency Department (HOSPITAL_COMMUNITY)
Admission: EM | Admit: 2021-11-27 | Discharge: 2021-11-27 | Discharge status: Against Medical Advice | Payer: 59 | Source: Home / Self Care

## 2021-12-04 ENCOUNTER — Other Ambulatory Visit (HOSPITAL_COMMUNITY): Payer: Self-pay | Admitting: Family Medicine

## 2021-12-04 DIAGNOSIS — M79671 Pain in right foot: Secondary | ICD-10-CM

## 2021-12-08 NOTE — Progress Notes (Signed)
Virtual Visit via Telephone Note ? ?I connected with [name] [name] on 12/11/21 at  8:40 AM EDT by telephone and verified that I am speaking with the correct person using two identifiers. ? ?Location: ?Patient: home ?Provider: office ?Persons participated in the visit- patient, provider  ?  ?I discussed the limitations, risks, security and privacy concerns of performing an evaluation and management service by telephone and the availability of in person appointments. I also discussed with the patient that there may be a patient responsible charge related to this service. The patient expressed understanding and agreed to proceed. ? ?  ?I discussed the assessment and treatment plan with the patient. The patient was provided an opportunity to ask questions and all were answered. The patient agreed with the plan and demonstrated an understanding of the instructions. ?  ?The patient was advised to call back or seek an in-person evaluation if the symptoms worsen or if the condition fails to improve as anticipated. ? ?I provided 13 minutes of non-face-to-face time during this encounter. ? ? ?[name], MD ? ? ? ?[place] MD/PA/NP OP Progress Note ? ?12/11/2021 9:10 AM ?[name] [name]  ?MRN:  [id number] ? ?Chief Complaint:  ?Chief Complaint  ?Patient presents with  ? Follow-up  ? Other  ? ?HPI:  ?This is a follow-up appointment for schizoaffective disorder and insomnia.  ?She states that she tripped as she missed the step and fell.  She denied having a fracture, but had right leg pain.  She denies any dizziness.  She talks about an episode of her being told by a girl in the church that "mercy of god.she will go to hell."  It occurred several years ago. She feels that way now.  She had worsening in depression and SI as it affected her mentally.  She reports improvement in her mood since then, and denies feeling depressed.  She reports good support from her cousin, who is a Environmental education officer. She states that her work is good.  She sleeps  well.  She denies change in appetite.  She feels good about recent weight loss.  She denies SI, AH, VH.  Although she was not taking her medication for a few days when she was depressed, she has been taking all of her medication consistently otherwise.  She feels comfortable to stay on the current medication regimen.  ? ? ?Wt Readings from Last 3 Encounters:  ?11/02/21 186 lb (84.4 kg)  ?10/04/21 188 lb 9.6 oz (85.5 kg)  ?09/19/21 190 lb 3.2 oz (86.3 kg)  ?  ? ?Daily routine:Reading bible, praying feeding animals, meeting with her friend  ?Employment: [employer],  She used to work full time at [employer] for 21 years, which was closed. ?Support:cousin/POA, aunt, who lives next door ?Household: by herself ?Marital status: divorced  ?Number of children: 0 ? ?Visit Diagnosis:  ?  ICD-10-CM   ?1. Schizoaffective disorder, depressive type (HCC)  F25.1 Valproic acid level  ?  Hepatic function panel  ?  CBC  ?  ?2. Insomnia, unspecified type  G47.00   ?  ? ? ?Past Psychiatric History: Please see initial evaluation for full details. I have reviewed the history. No updates at this time.  ?  ? ?Past Medical History:  ?Past Medical History:  ?Diagnosis Date  ? BV (bacterial vaginosis) 01/28/2013  ? Essential hypertension, benign   ? Hemorrhoids   ? Obsessive-compulsive disorders   ? Postmenopausal bleeding   ? Schizoaffective disorder ([place])   ? Tubular adenoma   ?  ?  Past Surgical History:  ?Procedure Laterality Date  ? BUNIONECTOMY    ? COLONOSCOPY N/A 07/05/2015  ? Dr.Rourk- internal hemorrhoids, redundant colon, colionic dierticulosis, colonic polyp. bx= tubular adenoma. next tcs 06/2020.   ? COLONOSCOPY WITH PROPOFOL N/A 10/12/2020  ? one 3 mm polyp in the cecum (tubular adenoma), sigmoid and descending colon diverticulosis.   ? HEMORRHOID BANDING  2017  ? Dr.Rourk  ? POLYPECTOMY  10/12/2020  ? Procedure: POLYPECTOMY INTESTINAL;  Surgeon: Daneil Dolin, MD;  [facility];  Service: Endoscopy;;  cecal colon  polyp;   ? ? ?Family Psychiatric History: Please see initial evaluation for full details. I have reviewed the history. No updates at this time.  ?  ? ?Family History:  ?Family History  ?Problem Relation Age of Onset  ? Diabetes type II Maternal Grandmother   ? Diabetes Maternal Grandmother   ? Colon cancer Mother 54  ? Hypertension Maternal Aunt   ? ? ?Social History:  ?Social History  ? ?Socioeconomic History  ? Marital status: Widowed  ?  Spouse name: Not on file  ? Number of children: 0  ? Years of education: Not on file  ? Highest education level: High school graduate  ?Occupational History  ? Not on file  ?Tobacco Use  ? Smoking status: Never  ? Smokeless tobacco: Never  ?Vaping Use  ? Vaping Use: Never used  ?Substance and Sexual Activity  ? Alcohol use: No  ? Drug use: No  ? Sexual activity: Yes  ?  Birth control/protection: Post-menopausal  ?Other Topics Concern  ? Not on file  ?Social History Narrative  ? Not on file  ? ?Social Determinants of Health  ? ?Financial Resource Strain: High Risk  ? Difficulty of Paying Living Expenses: Hard  ?Food Insecurity: No Food Insecurity  ? Worried About Charity fundraiser in the Last Year: Never true  ? Ran Out of Food in the Last Year: Never true  ?Transportation Needs: No Transportation Needs  ? Lack of Transportation (Medical): No  ? Lack of Transportation (Non-Medical): No  ?Physical Activity: Inactive  ? Days of Exercise per Week: 0 days  ? Minutes of Exercise per Session: 20 min  ?Stress: Stress Concern Present  ? Feeling of Stress : To some extent  ?Social Connections: Unknown  ? Frequency of Communication with Friends and Family: More than three times a week  ? Frequency of Social Gatherings with Friends and Family: Once a week  ? Attends Religious Services: Patient refused  ? Active Member of Clubs or Organizations: No  ? Attends Archivist Meetings: Never  ? Marital Status: Widowed  ? ? ?Allergies:  ?Allergies  ?Allergen Reactions  ? Omnicef  [Cefdinir] Swelling  ?  Oral swelling  ? Penicillins Rash  ? ? ?Metabolic Disorder Labs: ?No results found for: HGBA1C, MPG ?No results found for: PROLACTIN ?No results found for: CHOL, TRIG, HDL, CHOLHDL, VLDL, LDLCALC ?Lab Results  ?Component Value Date  ? TSH 4.68 (H) 05/30/2017  ? ? ?Therapeutic Level Labs: ?No results found for: LITHIUM ?Lab Results  ?Component Value Date  ? VALPROATE 56 04/16/2021  ? VALPROATE 68.2 12/30/2019  ? ?No components found for:  CBMZ ? ?Current Medications: ?Current Outpatient Medications  ?Medication Sig Dispense Refill  ? Ascorbic Acid (VITAMIN C) 1000 MG tablet Take 1,000 mg by mouth daily.    ? Calcium Citrate-Vitamin D (CALCIUM CITRATE + D3 PO) Take 1 tablet by mouth daily.    ? divalproex (  DEPAKOTE) 500 MG DR tablet Take 1 tablet (500 mg total) by mouth 2 (two) times daily. 180 tablet 1  ? FLUoxetine (PROZAC) 40 MG capsule Take 1 capsule (40 mg total) by mouth daily. 90 capsule 1  ? hydrochlorothiazide (HYDRODIURIL) 12.5 MG tablet Take 12.5 mg by mouth daily.    ? ibuprofen (ADVIL,MOTRIN) 200 MG tablet Take 400 mg by mouth 2 (two) times daily as needed for headache.    ? levothyroxine (SYNTHROID) 50 MCG tablet Take 50 mcg by mouth daily before breakfast.    ? loperamide (IMODIUM) 2 MG capsule Take by mouth as needed for diarrhea or loose stools.    ? Multiple Vitamin (MULTIVITAMIN WITH MINERALS) TABS tablet Take 1 tablet by mouth daily. (Patient not taking: Reported on 10/04/2021)    ? Omega-3 Fatty Acids (FISH OIL) 1000 MG CAPS Take 1,000 mg by mouth daily.    ? psyllium (METAMUCIL) 58.6 % packet Take 1 packet by mouth daily.    ? traZODone (DESYREL) 50 MG tablet Take 0.5 tablets (25 mg total) by mouth at bedtime as needed for sleep. 45 tablet 0  ? ziprasidone (GEODON) 40 MG capsule Take 1 capsule (40 mg total) by mouth 2 (two) times daily with a meal. 180 capsule 0  ? ?No current facility-administered medications for this visit.  ? ? ? ?Musculoskeletal: ?Strength & Muscle Tone:   N/A ?Gait & Station:  N/A ?Patient leans: N/A ? ?Psychiatric Specialty Exam: ?Review of Systems  ?Psychiatric/Behavioral:  Positive for dysphoric mood and suicidal ideas. Negative for agitation, behavioral problems

## 2021-12-11 ENCOUNTER — Telehealth (INDEPENDENT_AMBULATORY_CARE_PROVIDER_SITE_OTHER): Payer: Self-pay | Admitting: Psychiatry

## 2021-12-11 ENCOUNTER — Encounter: Payer: Self-pay | Admitting: Psychiatry

## 2021-12-11 DIAGNOSIS — G4700 Insomnia, unspecified: Secondary | ICD-10-CM

## 2021-12-11 DIAGNOSIS — F251 Schizoaffective disorder, depressive type: Secondary | ICD-10-CM

## 2021-12-13 ENCOUNTER — Ambulatory Visit (HOSPITAL_COMMUNITY): Payer: Self-pay | Admitting: Psychiatry

## 2021-12-24 ENCOUNTER — Ambulatory Visit (HOSPITAL_COMMUNITY): Payer: Self-pay | Admitting: Psychiatry

## 2022-01-08 ENCOUNTER — Ambulatory Visit: Payer: 59 | Admitting: Gastroenterology

## 2022-01-14 ENCOUNTER — Telehealth: Payer: Self-pay | Admitting: Psychiatry

## 2022-01-14 NOTE — Telephone Encounter (Signed)
Received a lab done on 6/8 by Dr. Delphina Cahill.   TSH 4.63, fT4 0.94 CBC wnl CMP wnl. LIpid LDL 119, Chol 202, TG 172, HbA1c 5.9% VPA 83

## 2022-01-15 ENCOUNTER — Ambulatory Visit (INDEPENDENT_AMBULATORY_CARE_PROVIDER_SITE_OTHER): Payer: Self-pay | Admitting: Psychiatry

## 2022-01-15 DIAGNOSIS — F251 Schizoaffective disorder, depressive type: Secondary | ICD-10-CM

## 2022-01-15 NOTE — Progress Notes (Signed)
Virtual Visit via Telephone Note  I connected with [name] [name] on 01/15/22 at 4:07 PM EDT by telephone and verified that I am speaking with the correct person using two identifiers.  Location: Patient: Car Provider: [place] office    I discussed the limitations, risks, security and privacy concerns of performing an evaluation and management service by telephone and the availability of in person appointments. I also discussed with the patient that there may be a patient responsible charge related to this service. The patient expressed understanding and agreed to proceed.   I provided 45 minutes of non-face-to-face time during this encounter.   [name] Smoker, LCSW   THERAPIST PROGRESS NOTE        Session Time:   Tuesday 01/15/2022 4:07 PM - 4:52 PM      Participation Level: Active  Behavioral Response: alert, depressed,   Type of Therapy: Individual Therapy  Treatment Goals addressed: [name] [name] will score less than 10 on the patient health questionnaire  Progress on goals: Progressing  Interventions: Supportive  Summary: [name] [name] is a 61 y.o. female who is a returning patient to this clinician. She  presents with a long-standing history of symptoms of anxiety and depression along with a previous diagnosis of schizoaffective disorder. She  reports a history of at least 3 psychiatric hospitalizations from 71 through 1990.   She has received outpatient services from [facility].    Patient last was seen via virtual visit about  2 months ago.  She reports continued symptoms of depression.  She reports stress related to her job.  Per patient's report, she reacted to her supervisor in an inappropriate manner when supervisor approached her regarding an incident on the job.  Patient expresses frustration as Librarian, academic approached her in the presence of another employee rather than talking to the patient in private.  She also expresses frustration  supervisor approached her during her lunch time.  Patient reports she was given some type of reprimand.  She has suspicions this supervisor may be trying to get rid of her per patient's report.  Patient also reports stress related to ongoing issues with family members and continued difficulty being assertive.  Patient also expresses guilt regarding her former tenant who recently died due to an overdose of cocaine.   Suicidal/Homicidal: Nowithout intent/plan  Therapist Response:  reviewed symptoms, discussed stressors, facilitated expression of thoughts and feelings, validated feelings, assisted patient identify early warning signs of anger and ways to intervene to avoid emotional outbursts, assisted patient examine her pattern of interaction with family and the effects on her mood/thoughts, will send basic personal rights handout in preparation for next session, assisted patient identify/challenge/and replace thoughts evoking inappropriate guilt about the death of her former tenant. Plan: Return in 3-4 weeks  Diagnosis: Axis I: Schizoaffective Disorder     Collaboration of Care: Psychiatrist AEB patient works with psychiatrist Dr. Modesta Messing  Patient/Guardian was advised Release of Information must be obtained prior to any record release in order to collaborate their care with an outside provider. Patient/Guardian was advised if they have not already done so to contact the registration department to sign all necessary forms in order for [name] to release information regarding their care.   Consent: Patient/Guardian gives verbal consent for treatment and assignment of benefits for services provided during this visit. Patient/Guardian expressed understanding and agreed to proceed.    [name] Smoker, LCSW 01/15/2022

## 2022-01-19 NOTE — Progress Notes (Unsigned)
[place] MD/PA/NP OP Progress Note  01/22/2022 9:05 AM [name] [name]  MRN:  [id number]  Chief Complaint:  Chief Complaint  Patient presents with   Follow-up   Other   HPI:  - Lipitor, levothyroxine were started.  This is a follow-up appointment for schizoaffective disorder and insomnia.  She states that her supervisor approached her during the lunch time, and told that other employee was fell due to the floor she mopped.  She responded in the way she regretted afterwards.  She still thinks that they should not have approached her during the lunch time, although she agrees to take a pause and communicate with them when she becomes calmer if she were to be in a similar situation.  She states that she should not have eaten lunch despite that the [place] told her not to that day.  She is doing well otherwise.  She had a good birthday with her cousins and siblings.  She feels less depressed compared to before.  She has depressive symptoms as in PHQ-9.  She sleeps well almost night.  She denies change in appetite.  She denies SI.  She denies AH, VH, paranoia.  Although she did not go to church the other time, she attributes it to going to [employer].  She feels comfortable to stay on the medication as it is.    Wt Readings from Last 3 Encounters:  01/22/22 183 lb (83 kg)  11/02/21 186 lb (84.4 kg)  10/04/21 188 lb 9.6 oz (85.5 kg)     Daily routine:Reading bible, praying feeding animals, meeting with her friend  Employment: [employer],  She used to work full time at [employer] for 21 years, which was closed. Support:cousin/POA, aunt, who lives next door Household: by herself Marital status: divorced  Number of children: 0  Visit Diagnosis:    ICD-10-CM   1. Schizoaffective disorder, depressive type ([place])  F25.1     2. Insomnia, unspecified type  G47.00       Past Psychiatric History: Please see initial evaluation for full details. I have reviewed the history. No updates at this time.      Past Medical History:  Past Medical History:  Diagnosis Date   BV (bacterial vaginosis) 01/28/2013   Essential hypertension, benign    Hemorrhoids    Obsessive-compulsive disorders    Postmenopausal bleeding    Schizoaffective disorder ([place])    Tubular adenoma     Past Surgical History:  Procedure Laterality Date   BUNIONECTOMY     COLONOSCOPY N/A 07/05/2015   Dr.Rourk- internal hemorrhoids, redundant colon, colionic dierticulosis, colonic polyp. bx= tubular adenoma. next tcs 06/2020.    COLONOSCOPY WITH PROPOFOL N/A 10/12/2020   one 3 mm polyp in the cecum (tubular adenoma), sigmoid and descending colon diverticulosis.    HEMORRHOID BANDING  2017   Dr.Rourk   POLYPECTOMY  10/12/2020   Procedure: POLYPECTOMY INTESTINAL;  Surgeon: Daneil Dolin, MD;  [facility];  Service: Endoscopy;;  cecal colon polyp;     Family Psychiatric History: Please see initial evaluation for full details. I have reviewed the history. No updates at this time.     Family History:  Family History  Problem Relation Age of Onset   Diabetes type II Maternal Grandmother    Diabetes Maternal Grandmother    Colon cancer Mother 66   Hypertension Maternal Aunt     Social History:  Social History   Socioeconomic History   Marital status: Widowed    Spouse  name: Not on file   Number of children: 0   Years of education: Not on file   Highest education level: High school graduate  Occupational History   Not on file  Tobacco Use   Smoking status: Never   Smokeless tobacco: Never  Vaping Use   Vaping Use: Never used  Substance and Sexual Activity   Alcohol use: No   Drug use: No   Sexual activity: Yes    Birth control/protection: Post-menopausal  Other Topics Concern   Not on file  Social History Narrative   Not on file   Social Determinants of Health   Financial Resource Strain: High Risk (01/17/2021)   Overall Financial Resource Strain (CARDIA)    Difficulty of Paying Living  Expenses: Hard  Food Insecurity: No Food Insecurity (01/17/2021)   Hunger Vital Sign    Worried About Running Out of Food in the Last Year: Never true    Ran Out of Food in the Last Year: Never true  Transportation Needs: No Transportation Needs (01/17/2021)   PRAPARE - Hydrologist (Medical): No    Lack of Transportation (Non-Medical): No  Physical Activity: Inactive (01/17/2021)   Exercise Vital Sign    Days of Exercise per Week: 0 days    Minutes of Exercise per Session: 20 min  Stress: Stress Concern Present (01/17/2021)   [place]    Feeling of Stress : To some extent  Social Connections: Unknown (01/17/2021)   Social Connection and Isolation Panel [NHANES]    Frequency of Communication with Friends and Family: More than three times a week    Frequency of Social Gatherings with Friends and Family: Once a week    Attends Religious Services: Patient refused    Marine scientist or Organizations: No    Attends Archivist Meetings: Never    Marital Status: Widowed    Allergies:  Allergies  Allergen Reactions   Omnicef [Cefdinir] Swelling    Oral swelling   Penicillins Rash    Metabolic Disorder Labs: No results found for: "HGBA1C", "MPG" No results found for: "PROLACTIN" No results found for: "CHOL", "TRIG", "HDL", "CHOLHDL", "VLDL", "LDLCALC" Lab Results  Component Value Date   TSH 4.68 (H) 05/30/2017    Therapeutic Level Labs: No results found for: "LITHIUM" Lab Results  Component Value Date   VALPROATE 56 04/16/2021   VALPROATE 68.2 12/30/2019   No results found for: "CBMZ"  Current Medications: Current Outpatient Medications  Medication Sig Dispense Refill   Ascorbic Acid (VITAMIN C) 1000 MG tablet Take 1,000 mg by mouth daily.     Calcium Citrate-Vitamin D (CALCIUM CITRATE + D3 PO) Take 1 tablet by mouth daily.     divalproex (DEPAKOTE) 500 MG DR  tablet Take 1 tablet (500 mg total) by mouth 2 (two) times daily. 180 tablet 1   hydrochlorothiazide (HYDRODIURIL) 12.5 MG tablet Take 12.5 mg by mouth daily.     ibuprofen (ADVIL,MOTRIN) 200 MG tablet Take 400 mg by mouth 2 (two) times daily as needed for headache.     levothyroxine (SYNTHROID) 75 MCG tablet Take 75 mcg by mouth every morning.     Multiple Vitamin (MULTIVITAMIN WITH MINERALS) TABS tablet Take 1 tablet by mouth daily.     Omega-3 Fatty Acids (FISH OIL) 1000 MG CAPS Take 1,000 mg by mouth daily.     ziprasidone (GEODON) 40 MG capsule Take 1 capsule (40 mg total) by  mouth 2 (two) times daily with a meal. 180 capsule 0   atorvastatin (LIPITOR) 20 MG tablet Take 20 mg by mouth daily.     [START ON 02/04/2022] FLUoxetine (PROZAC) 40 MG capsule Take 1 capsule (40 mg total) by mouth daily. 90 capsule 1   loperamide (IMODIUM) 2 MG capsule Take by mouth as needed for diarrhea or loose stools. (Patient not taking: Reported on 01/22/2022)     psyllium (METAMUCIL) 58.6 % packet Take 1 packet by mouth daily. (Patient not taking: Reported on 01/22/2022)     traZODone (DESYREL) 50 MG tablet Take 0.5 tablets (25 mg total) by mouth at bedtime as needed for sleep. 45 tablet 0   No current facility-administered medications for this visit.     Musculoskeletal: Strength & Muscle Tone: within normal limits Gait & Station: normal Patient leans: N/A  Psychiatric Specialty Exam: Review of Systems  Psychiatric/Behavioral:  Positive for decreased concentration, dysphoric mood and sleep disturbance. Negative for agitation, behavioral problems, confusion, hallucinations, self-injury and suicidal ideas. The patient is nervous/anxious. The patient is not hyperactive.   All other systems reviewed and are negative.   Blood pressure 104/70, pulse 86, temperature 98.7 F (37.1 C), height 5' 1.25" (1.556 m), weight 183 lb (83 kg), last menstrual period 03/18/2012, SpO2 96 %.Body mass index is 34.3 kg/m.   General Appearance: Fairly Groomed  Eye Contact:  Good  Speech:  Clear and Coherent  Volume:  Normal  Mood:   okay  Affect:  Appropriate, Congruent, and occasionally down  Thought Process:  Coherent  Orientation:  Full (Time, Place, and Person)  Thought Content: Logical   Suicidal Thoughts:  No  Homicidal Thoughts:  No  Memory:  Immediate;   Good  Judgement:  Good  Insight:  Good  Psychomotor Activity:  Normal  Concentration:  Concentration: Good and Attention Span: Good  Recall:  Good  Fund of Knowledge: Good  Language: Good  Akathisia:  No  Handed:  Right  AIMS (if indicated): not done  Assets:  Communication Skills Desire for Improvement  ADL's:  Intact  Cognition: WNL  Sleep:  Fair   Screenings: GAD-7    [place] Office Visit from 01/17/2021 in [place]  Total GAD-7 Score 7      PHQ2-9    [place] Office Visit from 01/22/2022 in [place] from 11/22/2021 in [facility] Counselor from 10/25/2021 in [place] Office Visit from 09/18/2021 in [place] Office Visit from 04/16/2021 in [place]  PHQ-2 Total Score '4 3 3 1 1  '$ PHQ-9 Total Score '12 13 16 '$ -- --      [place] Office Visit from 01/22/2022 in [facility] ED from 11/02/2021 in [place] Counselor from 10/25/2021 in [facility] No Risk No Risk No Risk        Assessment and Plan:  [name] [name] is a 61 y.o. year old female with a history of schizoaffective disorder, hypertension , who presents for follow up appointment for below.    1. Schizoaffective disorder, depressive type ([place]) There has been overall improvement in depressive symptoms since the last visit.  Psychosocial  stressors includes work,  occasional conflict with her family, breaking up with a man, and the recent loss of her uncle and her friend.  Will continue current dose of Geodon to target schizoaffective disorder.  Will continue Depakote for mood dysregulation.  Will continue fluoxetine to target depression and anxiety.  She will continue to see Ms. Bynum for therapy.   2. Insomnia, unspecified type Improving. She has good benefit from trazodone.  Will continue the current dose to target insomnia.    Plan Continue Ziprasidone 40 mg twice a day (was on 40 mg/60 mg; used to have cogwheel rigidity)  (QTc 446 msec in 10/2020)  Continue fluoxetine 40 mg daily  Continue Depakote 500 mg twice a day Continue Trazodone 25 mg as needed for sleep - she declined refill Next appointment: 8/21 at 1:30 for 30 mins, in person Labs:  LFT, CBC: wnl, VPA 83 in  01/2022   - She takes Proslim for appetite, which contains vitamins, lactospore probiotics.  no caffeine included in the label   Past trials of medication: imipramine, fluoxetine,Depakote Geodon, Abilify (insomnia)   The patient demonstrates the following risk factors for suicide: Chronic risk factors for suicide include: psychiatric disorder of schizoaffective disorder. Acute risk factors for suicide include: social withdrawal/isolation. Protective factors for this patient include: hope for the future. Considering these factors, the overall suicide risk at this point appears to be low. She denies gun access at home. Patient is appropriate for outpatient follow up.          Collaboration of Care: Collaboration of Care: Other N/A  Patient/Guardian was advised Release of Information must be obtained prior to any record release in order to collaborate their care with an outside provider. Patient/Guardian was advised if they have not already done so to contact the registration department to sign all necessary forms in order for [name] to release information  regarding their care.   Consent: Patient/Guardian gives verbal consent for treatment and assignment of benefits for services provided during this visit. Patient/Guardian expressed understanding and agreed to proceed.    [name], MD 01/22/2022, 9:05 AM

## 2022-01-22 ENCOUNTER — Encounter: Payer: Self-pay | Admitting: Psychiatry

## 2022-01-22 ENCOUNTER — Ambulatory Visit (INDEPENDENT_AMBULATORY_CARE_PROVIDER_SITE_OTHER): Payer: Self-pay | Admitting: Psychiatry

## 2022-01-22 VITALS — BP 104/70 | HR 86 | Temp 98.7°F | Ht 61.25 in | Wt 183.0 lb

## 2022-01-22 DIAGNOSIS — F251 Schizoaffective disorder, depressive type: Secondary | ICD-10-CM

## 2022-01-22 DIAGNOSIS — G4700 Insomnia, unspecified: Secondary | ICD-10-CM

## 2022-01-22 MED ORDER — FLUOXETINE HCL 40 MG PO CAPS: 40.0000 mg | ORAL_CAPSULE | Freq: Every day | ORAL | 1 refills | Status: DC

## 2022-01-22 NOTE — Patient Instructions (Signed)
Continue Ziprasidone 40 mg twice a day  Continue fluoxetine 40 mg daily  Continue Depakote 500 mg twice a day Continue Trazodone 25 mg as needed for sleep - she declined refill Next appointment: 8/21 at 1:30

## 2022-01-30 ENCOUNTER — Telehealth: Payer: Self-pay | Admitting: Psychiatry

## 2022-01-30 NOTE — Telephone Encounter (Signed)
Pt calling having some issues and needs to speak to you possibly changing meds. Wanted sooner appt than 03-25-22. Has appt w/ therapist 01-31-29.

## 2022-01-30 NOTE — Telephone Encounter (Signed)
Jess. Could you contact the patient to explore her concerns? Thanks.

## 2022-01-30 NOTE — Telephone Encounter (Signed)
Noted, thanks!

## 2022-01-30 NOTE — Telephone Encounter (Signed)
left message to call office back to get more details and if she needed to move her appt.

## 2022-01-31 ENCOUNTER — Ambulatory Visit (INDEPENDENT_AMBULATORY_CARE_PROVIDER_SITE_OTHER): Payer: Self-pay | Admitting: Psychiatry

## 2022-01-31 DIAGNOSIS — F251 Schizoaffective disorder, depressive type: Secondary | ICD-10-CM

## 2022-01-31 NOTE — Progress Notes (Signed)
Virtual Visit via Telephone Note  I connected with CRYTAL PENSINGER on 01/31/22 at 8:33 AM EDT  by telephone and verified that I am speaking with the correct person using two identifiers.  Location: Patient: [name] [name]  Provider: [place] office    I discussed the limitations, risks, security and privacy concerns of performing an evaluation and management service by telephone and the availability of in person appointments. I also discussed with the patient that there may be a patient responsible charge related to this service. The patient expressed understanding and agreed to proceed.   I provided 27 minutes of non-face-to-face time during this encounter.   Alonza Smoker, LCSW    THERAPIST PROGRESS NOTE        Session Time:   Thursday 01/31/2022 8:33 AM  - 9:00 AM     Participation Level: Active  Behavioral Response: alert, depressed,   Type of Therapy: Individual Therapy  Treatment Goals addressed: Luz will score less than 10 on the patient health questionnaire  Progress on goals: Not Progressing  Interventions: Supportive  Summary: [name] is a 61 y.o. female who is a returning patient to this clinician. She  presents with a long-standing history of symptoms of anxiety and depression along with a previous diagnosis of schizoaffective disorder. She  reports a history of at least 3 psychiatric hospitalizations from 64 through 1990.   She has received outpatient services from [facility] and [facility].    Patient last was seen via virtual visit about  2 months ago.  She reports continued symptoms of depression as reflected in PHQ 2 and 9.  She continues to express regret and guilt about her reaction to her supervisor a few weeks ago.  However, she reports doing well at work and having no negative interactions with anyone since that time.  She expresses frustration as she reports one of her cousins wants patient to move out of her home so cousin can move  into patient's home.   Suicidal/Homicidal: Nowithout intent/plan  Therapist Response:  reviewed symptoms, discussed stressors, facilitated expression of thoughts and feelings, validated feelings,  assisted patient identify/challenge/and replace thoughts evoking inappropriate guilt regarding incidental job, assisted patient examine her current pattern of interaction on the job and the results to assist patient identify more realistic thoughts about interaction with supervisor, assisted patient examine her thought patterns about recent conversation with cousin, assisted patient identify/challenge/and replace cognitive distortions with more rational thoughts, discussed basic personal rights to assist patient identify statements to promote effective assertion  Plan: Return in 3-4 weeks  Diagnosis: Axis I: Schizoaffective Disorder     Collaboration of Care: Psychiatrist AEB patient works with psychiatrist Dr. Modesta Messing  Patient/Guardian was advised Release of Information must be obtained prior to any record release in order to collaborate their care with an outside provider. Patient/Guardian was advised if they have not already done so to contact the registration department to sign all necessary forms in order for [name] to release information regarding their care.   Consent: Patient/Guardian gives verbal consent for treatment and assignment of benefits for services provided during this visit. Patient/Guardian expressed understanding and agreed to proceed.    Alonza Smoker, LCSW 01/31/2022

## 2022-01-31 NOTE — Telephone Encounter (Signed)
Thanks. I would recommend she continue her current medication, and continue to work on therapy. If she still has any concerns, I would recommend she has sooner follow up.

## 2022-01-31 NOTE — Telephone Encounter (Signed)
called pt and left a message to call office back.

## 2022-01-31 NOTE — Telephone Encounter (Signed)
left message with direction and to also call office back to set up an eariler appt.

## 2022-01-31 NOTE — Telephone Encounter (Signed)
pt called left a message that she been on the medication for years and she feels like that they stopped working.  she states she needs something to help she  not getting along with others and she needs to try something new.

## 2022-01-31 NOTE — Telephone Encounter (Signed)
Noted thanks °

## 2022-02-06 ENCOUNTER — Ambulatory Visit (INDEPENDENT_AMBULATORY_CARE_PROVIDER_SITE_OTHER): Payer: 59 | Admitting: Gastroenterology

## 2022-02-06 ENCOUNTER — Encounter: Payer: Self-pay | Admitting: Gastroenterology

## 2022-02-06 VITALS — BP 112/60 | HR 74 | Temp 98.5°F | Ht 61.25 in | Wt 182.1 lb

## 2022-02-06 DIAGNOSIS — K648 Other hemorrhoids: Secondary | ICD-10-CM

## 2022-02-06 NOTE — Patient Instructions (Signed)
Please continue to avoid straining.  You should limit your toilet time to 2-3 minutes at the most.   Continue to avoid constipation.  Please call me with any concerns or issues!  I recommend taking Benefiber 2 teaspoons daily to help with keeping bowels regular.  I will see you in 6 months, but let me know if you have further hemorrhoid symptoms! I can see you sooner if needed.  I enjoyed seeing you again today! As you know, I value our relationship and want to provide genuine, compassionate, and quality care. I welcome your feedback. If you receive a survey regarding your visit,  I greatly appreciate you taking time to fill this out. See you next time!  Annitta Needs, PhD, ANP-BC [facility]

## 2022-02-06 NOTE — Progress Notes (Signed)
     [place] BANDING PROCEDURE NOTE  [name] [name] is a 61 y.o. female presenting today for consideration of hemorrhoid banding. Last colonoscopy March 2022 with one 3 mm polyp in the cecum (tubular adenoma), sigmoid and descending colon diverticulosis. She has had banding of left lateral, right posterior, and right anterior. She has had recent rectal bleeding, itching, burning. Some prolapse.   The patient presents with symptomatic grade 3 hemorrhoids, unresponsive to maximal medical therapy, requesting rubber band ligation of her hemorrhoidal disease. All risks, benefits, and alternative forms of therapy were described and informed consent was obtained.  In the left lateral decubitus position, anoscopic examination revealed grade 3 hemorrhoids in the left lateral position (s).  The decision was made to band the left lateral internal hemorrhoid, and the [place] was used to perform band ligation without complication. Digital anorectal examination was then performed to assure proper positioning of the band, and to adjust the banded tissue as required. The patient was discharged home without pain or other issues. Dietary and behavioral recommendations were given, along with follow-up instructions. The patient will return in 6 months for routine follow-up. I have asked her to add fiber daily.   No complications were encountered and the patient tolerated the procedure well.   Annitta Needs, PhD, ANP-BC [facility]

## 2022-02-12 ENCOUNTER — Ambulatory Visit (HOSPITAL_COMMUNITY): Payer: Self-pay | Admitting: Psychiatry

## 2022-02-14 ENCOUNTER — Ambulatory Visit: Payer: Self-pay

## 2022-02-14 ENCOUNTER — Other Ambulatory Visit: Payer: Self-pay | Admitting: Nurse Practitioner

## 2022-02-14 DIAGNOSIS — M533 Sacrococcygeal disorders, not elsewhere classified: Secondary | ICD-10-CM

## 2022-02-22 ENCOUNTER — Other Ambulatory Visit: Payer: Self-pay | Admitting: Psychiatry

## 2022-02-25 ENCOUNTER — Other Ambulatory Visit: Payer: Self-pay | Admitting: Psychiatry

## 2022-02-28 ENCOUNTER — Ambulatory Visit (INDEPENDENT_AMBULATORY_CARE_PROVIDER_SITE_OTHER): Payer: 59 | Admitting: Psychiatry

## 2022-02-28 ENCOUNTER — Telehealth (HOSPITAL_COMMUNITY): Payer: Self-pay | Admitting: Psychiatry

## 2022-02-28 DIAGNOSIS — F251 Schizoaffective disorder, depressive type: Secondary | ICD-10-CM | POA: Diagnosis not present

## 2022-02-28 NOTE — Telephone Encounter (Signed)
Therapist called patient for scheduled appointment and received voicemail message mailbox is full.  Therefore, therapist was unable to leave message.

## 2022-02-28 NOTE — Progress Notes (Signed)
Virtual Visit via Telephone Note  I connected with [name] [name] on 02/28/22 at 4:18 PM EDT by telephone and verified that I am speaking with the correct person using two identifiers.  Location: Patient: Home Provider: [place] office    I discussed the limitations, risks, security and privacy concerns of performing an evaluation and management service by telephone and the availability of in person appointments. I also discussed with the patient that there may be a patient responsible charge related to this service. The patient expressed understanding and agreed to proceed.    I provided 42 minutes of non-face-to-face time during this encounter.   Alonza Smoker, LCSW    THERAPIST PROGRESS NOTE        Session Time:   Thursday 02/28/2022 4:18 -  5:00 PM      Participation Level: Active  Behavioral Response: alert, depressed,   Type of Therapy: Individual Therapy  Treatment Goals addressed: [name] [name] will score less than 10 on the patient health questionnaire  Progress on goals: Progressing  Interventions: Supportive  Summary: [name] [name] is a 61 y.o. female who is a returning patient to this clinician. She  presents with a long-standing history of symptoms of anxiety and depression along with a previous diagnosis of schizoaffective disorder. She  reports a history of at least 3 psychiatric hospitalizations from 65 through 1990.   She has received outpatient services from [facility] and [facility] [facility].    Patient last was seen via virtual visit about 4 weeks ago.  She reports continued symptoms of depression as reflected in PHQ 2 and 9 but decreased intensity and frequency.  She reports having passive SI about 2 weeks ago triggered by misunderstanding between she and her aunt.  However, she told her aunt about her thoughts about harming self and aunt cleared up the miscommunication.  Patient denies having a plan at that time.  Patient denies having any SI  since that time.  She talks a lot about past experiences with church and reports sometimes having ruminating thoughts about this particularly being spiritually doomed.  She reports continuing to read her Bible and pray.  She maintains a positive relationship with an older female friend and is looking forward to seeing her this Saturday.   Suicidal/Homicidal: Nowithout intent/plan patient agrees to call this practice, call 911, have someone take her to the ED should symptoms worsen.  Therapist Response:  reviewed symptoms, discussed stressors, facilitated expression of thoughts and feelings, validated feelings,  assisted patient identify/challenge/and replace negative thoughts with more helpful thoughts, assisted patient identify ways to use her spirituality to develop coping statements, developed plan with patient to use replacement statements for negative thoughts, praised and reinforced patient's involvement with her older female friend   Plan: Return in 3-4 weeks        Diagnosis: Axis I: Schizoaffective Disorder     Collaboration of Care: Psychiatrist AEB patient works with psychiatrist Dr. Modesta Messing  Patient/Guardian was advised Release of Information must be obtained prior to any record release in order to collaborate their care with an outside provider. Patient/Guardian was advised if they have not already done so to contact the registration department to sign all necessary forms in order for [name] to release information regarding their care.   Consent: Patient/Guardian gives verbal consent for treatment and assignment of benefits for services provided during this visit. Patient/Guardian expressed understanding and agreed to proceed.    Alonza Smoker, LCSW 02/28/2022

## 2022-03-07 ENCOUNTER — Other Ambulatory Visit (HOSPITAL_COMMUNITY): Payer: Self-pay | Admitting: Internal Medicine

## 2022-03-07 DIAGNOSIS — Z1231 Encounter for screening mammogram for malignant neoplasm of breast: Secondary | ICD-10-CM

## 2022-03-12 ENCOUNTER — Ambulatory Visit (HOSPITAL_COMMUNITY): Payer: Self-pay | Admitting: Psychiatry

## 2022-03-12 ENCOUNTER — Encounter (HOSPITAL_COMMUNITY): Payer: Self-pay

## 2022-03-21 NOTE — Progress Notes (Addendum)
BH MD/PA/NP OP Progress Note  03/25/2022 2:03 PM [name] [name]  MRN:  [id number]  Chief Complaint:  Chief Complaint  Patient presents with   Follow-up   HPI:  This is a follow-up appointment for schizoaffective disorder and insomnia.  She states that she had SI of overdosing her medication when she was stressed.  She states that her 61 year old aunt told her that "you have talked too much."  Although this aunt describes herself as a "therapist," she decided not to talk with her as much.  She states that Ms. Bynum was also helpful during this time.  She states that her mood has been better since then.  She states that she was also stressed as the man who used to live in a mobile home killed himself.  Her uncle and his wife has been very helpful to her. They helped her to pave drive way, which she reimbursed, and helped her to fix the bathroom.  She reports frustration against her supervisor at work in [place].  However, she her from others that this supervisor has been that way to another worker, and she denies any concern at this time.  She has started to go to another church, and finds people to be very welcoming.  Although she still has some paranoia at the previous church, she denies concern. The patient has mood symptoms as in PHQ-9/GAD-7. She enjoys riding a motorcycle with her cousin. She denies SI.  She agrees to contact emergency resources if any worsening in SI.  She denies AH, VH, HI.  She feels comfortable to stay on the current medication regimen.   Visit Diagnosis:    ICD-10-CM   1. Schizoaffective disorder, depressive type ([place])  F25.1 EKG 12-Lead    2. Insomnia, unspecified type  G47.00       Past Psychiatric History: Please see initial evaluation for full details. I have reviewed the history. No updates at this time.     Past Medical History:  Past Medical History:  Diagnosis Date   BV (bacterial vaginosis) 01/28/2013   Essential hypertension, benign    Hemorrhoids     Obsessive-compulsive disorders    Postmenopausal bleeding    Schizoaffective disorder ([place])    Tubular adenoma     Past Surgical History:  Procedure Laterality Date   BUNIONECTOMY     COLONOSCOPY N/A 07/05/2015   Dr.Rourk- internal hemorrhoids, redundant colon, colionic dierticulosis, colonic polyp. bx= tubular adenoma. next tcs 06/2020.    COLONOSCOPY WITH PROPOFOL N/A 10/12/2020   one 3 mm polyp in the cecum (tubular adenoma), sigmoid and descending colon diverticulosis.    HEMORRHOID BANDING  2017   Dr.Rourk   POLYPECTOMY  10/12/2020   Procedure: POLYPECTOMY INTESTINAL;  Surgeon: Daneil Dolin, MD;  Location: [facility];  Service: Endoscopy;;  cecal colon polyp;     Family Psychiatric History: Please see initial evaluation for full details. I have reviewed the history. No updates at this time.     Family History:  Family History  Problem Relation Age of Onset   Diabetes type II Maternal Grandmother    Diabetes Maternal Grandmother    Colon cancer Mother 31   Hypertension Maternal Aunt     Social History:  Social History   Socioeconomic History   Marital status: Widowed    Spouse name: Not on file   Number of children: 0   Years of education: Not on file   Highest education level: High school graduate  Occupational History  Not on file  Tobacco Use   Smoking status: Never    Passive exposure: Current   Smokeless tobacco: Never  Vaping Use   Vaping Use: Never used  Substance and Sexual Activity   Alcohol use: No   Drug use: No   Sexual activity: Yes    Birth control/protection: Post-menopausal  Other Topics Concern   Not on file  Social History Narrative   Not on file   Social Determinants of Health   Financial Resource Strain: High Risk (01/17/2021)   Overall Financial Resource Strain (CARDIA)    Difficulty of Paying Living Expenses: Hard  Food Insecurity: No Food Insecurity (01/17/2021)   Hunger Vital Sign    Worried About Running Out of Food in  the Last Year: Never true    Ran Out of Food in the Last Year: Never true  Transportation Needs: No Transportation Needs (01/17/2021)   PRAPARE - Hydrologist (Medical): No    Lack of Transportation (Non-Medical): No  Physical Activity: Inactive (01/17/2021)   Exercise Vital Sign    Days of Exercise per Week: 0 days    Minutes of Exercise per Session: 20 min  Stress: Stress Concern Present (01/17/2021)   [place]    Feeling of Stress : To some extent  Social Connections: Unknown (01/17/2021)   Social Connection and Isolation Panel [NHANES]    Frequency of Communication with Friends and Family: More than three times a week    Frequency of Social Gatherings with Friends and Family: Once a week    Attends Religious Services: Patient refused    Marine scientist or Organizations: No    Attends Archivist Meetings: Never    Marital Status: Widowed    Allergies:  Allergies  Allergen Reactions   Omnicef [Cefdinir] Swelling    Oral swelling   Penicillins Rash    Metabolic Disorder Labs: No results found for: "HGBA1C", "MPG" No results found for: "PROLACTIN" No results found for: "CHOL", "TRIG", "HDL", "CHOLHDL", "VLDL", "LDLCALC" Lab Results  Component Value Date   TSH 4.68 (H) 05/30/2017    Therapeutic Level Labs: No results found for: "LITHIUM" Lab Results  Component Value Date   VALPROATE 56 04/16/2021   VALPROATE 68.2 12/30/2019   No results found for: "CBMZ"  Current Medications: Current Outpatient Medications  Medication Sig Dispense Refill   Ascorbic Acid (VITAMIN C) 1000 MG tablet Take 1,000 mg by mouth daily.     atorvastatin (LIPITOR) 20 MG tablet Take 20 mg by mouth daily.     Calcium Citrate-Vitamin D (CALCIUM CITRATE + D3 PO) Take 1 tablet by mouth daily.     FLUoxetine (PROZAC) 40 MG capsule Take 1 capsule (40 mg total) by mouth daily. 90 capsule 1    hydrochlorothiazide (HYDRODIURIL) 12.5 MG tablet Take 12.5 mg by mouth daily.     ibuprofen (ADVIL,MOTRIN) 200 MG tablet Take 400 mg by mouth 2 (two) times daily as needed for headache.     levothyroxine (SYNTHROID) 75 MCG tablet Take 75 mcg by mouth every morning.     loperamide (IMODIUM) 2 MG capsule Take by mouth as needed for diarrhea or loose stools.     Multiple Vitamin (MULTIVITAMIN WITH MINERALS) TABS tablet Take 1 tablet by mouth daily.     Omega-3 Fatty Acids (FISH OIL) 1000 MG CAPS Take 1,000 mg by mouth daily.     psyllium (METAMUCIL) 58.6 % packet Take 1  packet by mouth daily. As needed     [START ON 05/27/2022] divalproex (DEPAKOTE) 500 MG DR tablet Take 1 tablet (500 mg total) by mouth 2 (two) times daily. 180 tablet 0   traZODone (DESYREL) 50 MG tablet Take 0.5 tablets (25 mg total) by mouth at bedtime as needed for sleep. 45 tablet 0   [START ON 05/25/2022] ziprasidone (GEODON) 40 MG capsule Take 1 capsule (40 mg total) by mouth 2 (two) times daily with a meal. 180 capsule 0   No current facility-administered medications for this visit.     Musculoskeletal: Strength & Muscle Tone: within normal limits Gait & Station: normal Patient leans: N/A  Psychiatric Specialty Exam: Review of Systems  Psychiatric/Behavioral:  Positive for dysphoric mood. Negative for agitation, behavioral problems, confusion, decreased concentration, hallucinations, self-injury, sleep disturbance and suicidal ideas. The patient is nervous/anxious. The patient is not hyperactive.   All other systems reviewed and are negative.   Blood pressure 117/79, pulse 79, temperature 97.9 F (36.6 C), temperature source Temporal, weight 180 lb 6.4 oz (81.8 kg), last menstrual period 03/18/2012.Body mass index is 33.81 kg/m.  General Appearance: Fairly Groomed  Eye Contact:  Good  Speech:  Clear and Coherent  Volume:  Normal  Mood:   better  Affect:  Appropriate, Congruent, and calmer  Thought Process:   Coherent  Orientation:  Full (Time, Place, and Person)  Thought Content: Logical   Suicidal Thoughts:  No  Homicidal Thoughts:  No  Memory:  Immediate;   Good  Judgement:  Good  Insight:  Present  Psychomotor Activity:  Normal, +postural tremors on bilateral arms. No resting tremors, no rigidity  Concentration:  Concentration: Good and Attention Span: Good  Recall:  Good  Fund of Knowledge: Good  Language: Good  Akathisia:  No  Handed:  Right  AIMS (if indicated): 0   Assets:  Communication Skills Desire for Improvement  ADL's:  Intact  Cognition: WNL  Sleep:  Good   Screenings: GAD-7    Flowsheet Row Office Visit from 03/25/2022 in [place] Office Visit from 01/17/2021 in [place]  Total GAD-7 Score 8 7      PHQ2-9    [place] Office Visit from 03/25/2022 in [place] [facility] from 02/28/2022 in [place] [facility] from 01/31/2022 in [place] Office Visit from 01/22/2022 in [place] [facility] from 11/22/2021 in [facility]  PHQ-2 Total Score 0 '2 3 4 3  '$ PHQ-9 Total Score '7 10 18 12 13      '$ Flowsheet Row Office Visit from 03/25/2022 in [place] Office Visit from 01/22/2022 in [place] ED from 11/02/2021 in [place] No Risk No Risk No Risk        Assessment and Plan:  [name] [name] is a 61 y.o. year old female with a history of schizoaffective disorder, hypertension , who presents for follow up appointment for below.   1. Schizoaffective disorder, depressive type ([place]) There has been overall improvement in her mood symptoms, although there was an episode of her having SI without intent in the context of conflict with her aunt.   Other psychosocial stressors includes work, occasional conflict with her family, loss of her uncle and her friend.  She had a good benefit from Geodon/denies any significant psychotic episodes except occasional paranoia.  Although there has been fluctuation  in her mood at times , this usually resolves without medication adjustment after being provided supportive therapy.  Will continue current dose of Geodon to target schizoaffective disorder.  Will obtain EKG to monitor QTc prolongation.  Will continue Depakote for mood dysregulation.  Will continue fluoxetine to target depression and anxiety.  She will need to see Ms. Bynum for therapy.   2. Insomnia, unspecified type Improving.  Will continue current dose of trazodone as needed for insomnia.    Plan Continue Ziprasidone 40 mg twice a day (she had cogwheel rigidity on 40 mg in AM/60 mg in PM )  (QTc 446 msec in 10/2020)  Obtain EKG to monitor QTc prolongation Continue fluoxetine 40 mg daily  Continue Depakote 500 mg twice a day (Labs:  LFT, CBC: wnl, VPA 83 in  01/2022) Continue Trazodone 25 mg as needed for sleep - she declined refill Next appointment: 10/25 at 8:40 for phone visit.  She prefers to be transfer to [place] office due to difficulty in coming in person visit to [facility] office.   - She takes Proslim for appetite, which contains vitamins, lactospore probiotics.  no caffeine included in the label   Past trials of medication: imipramine, fluoxetine,Depakote Geodon, Abilify (insomnia)   The patient demonstrates the following risk factors for suicide: Chronic risk factors for suicide include: psychiatric disorder of schizoaffective disorder. Acute risk factors for suicide include: social withdrawal/isolation. Protective factors for this patient include: hope for the future. Considering these factors, the overall suicide risk at this point appears to be low. She denies gun access at home. Patient is appropriate for outpatient follow  up.      This clinician has discussed the side effect associated with medication prescribed during this encounter. Please refer to notes in the previous encounters for more details.      Collaboration of Care: Collaboration of Care: Other N/A  Patient/Guardian was advised Release of Information must be obtained prior to any record release in order to collaborate their care with an outside provider. Patient/Guardian was advised if they have not already done so to contact the registration department to sign all necessary forms in order for [name] to release information regarding their care.   Consent: Patient/Guardian gives verbal consent for treatment and assignment of benefits for services provided during this visit. Patient/Guardian expressed understanding and agreed to proceed.    [name], MD 03/25/2022, 2:03 PM

## 2022-03-25 ENCOUNTER — Ambulatory Visit
Admission: RE | Admit: 2022-03-25 | Discharge: 2022-03-25 | Disposition: A | Discharge status: Home / Self Care | Payer: 59 | Source: Ambulatory Visit | Attending: Psychiatry | Admitting: Psychiatry

## 2022-03-25 ENCOUNTER — Encounter: Payer: Self-pay | Admitting: Psychiatry

## 2022-03-25 ENCOUNTER — Ambulatory Visit (INDEPENDENT_AMBULATORY_CARE_PROVIDER_SITE_OTHER): Payer: Self-pay | Admitting: Psychiatry

## 2022-03-25 VITALS — BP 117/79 | HR 79 | Temp 97.9°F | Wt 180.4 lb

## 2022-03-25 DIAGNOSIS — G4700 Insomnia, unspecified: Secondary | ICD-10-CM

## 2022-03-25 DIAGNOSIS — F251 Schizoaffective disorder, depressive type: Secondary | ICD-10-CM

## 2022-03-25 MED ORDER — ZIPRASIDONE HCL 40 MG PO CAPS: 40.0000 mg | ORAL_CAPSULE | Freq: Two times a day (BID) | ORAL | 0 refills | Status: DC

## 2022-03-25 MED ORDER — DIVALPROEX SODIUM 500 MG PO DR TAB: 500.0000 mg | DELAYED_RELEASE_TABLET | Freq: Two times a day (BID) | ORAL | 0 refills | Status: DC

## 2022-04-09 ENCOUNTER — Ambulatory Visit (INDEPENDENT_AMBULATORY_CARE_PROVIDER_SITE_OTHER): Payer: 59 | Admitting: Psychiatry

## 2022-04-09 DIAGNOSIS — F251 Schizoaffective disorder, depressive type: Secondary | ICD-10-CM

## 2022-04-09 DIAGNOSIS — R69 Illness, unspecified: Secondary | ICD-10-CM | POA: Diagnosis not present

## 2022-04-09 NOTE — Progress Notes (Signed)
Virtual Visit via Telephone Note  I connected with [name] [name] on 04/09/22 at 4:10 PM EDT  by telephone and verified that I am speaking with the correct person using two identifiers.  Location: Patient: Home Provider: [place] office    I discussed the limitations, risks, security and privacy concerns of performing an evaluation and management service by telephone and the availability of in person appointments. I also discussed with the patient that there may be a patient responsible charge related to this service. The patient expressed understanding and agreed to proceed.   The patient was advised to call back or seek an in-person evaluation if the symptoms worsen or if the condition fails to improve as anticipated.  I provided 38 minutes of non-face-to-face time during this encounter.   Alonza Smoker, LCSW    THERAPIST PROGRESS NOTE        Session Time:   Tuesday 04/09/2022 4:10 PM - 4:48 PM      Participation Level: Active  Behavioral Response: alert, depressed,   Type of Therapy: Individual Therapy  Treatment Goals addressed: Kitty will score less than 10 on the patient health questionnaire  Progress on goals: Progressing  Interventions: Supportive  Summary: [name] [name] is a 61 y.o. female who is a returning patient to this clinician. She  presents with a long-standing history of symptoms of anxiety and depression along with a previous diagnosis of schizoaffective disorder. She  reports a history of at least 3 psychiatric hospitalizations from 41 through 1990.   She has received outpatient services from [facility] and [facility].    Patient last was seen via virtual visit about 2   weeks ago.  She reports continued symptoms of depression as reflected in PHQ 2 and 9.  She states things have been going well until today.  She expresses frustration regarding interaction with one of her supervisors as she thinks the supervisor mislead her about her  schedule. 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 She reports things are going well with her family members.  She is looking forward to going motorcycle riding in the mountains with one of her cousins this weekend.  Patient denies having any suicidal ideation since last session.     Suicidal/Homicidal: Nowithout intent/plan patient agrees to call this practice, call 911, have someone take her to the ED should symptoms worsen.  Therapist Response:  reviewed symptoms, discussed stressors, facilitated expression of thoughts and feelings, validated feelings,  assisted patient identify/challenge/and replace negative thoughts with more helpful thoughts,   Plan: Return in 3-4 weeks        Diagnosis: Axis I: Schizoaffective Disorder     Collaboration of Care: Psychiatrist AEB patient works with psychiatrist Dr. Modesta Messing  Patient/Guardian was advised Release of Information must be obtained prior to any record release in order to collaborate their care with an outside provider. Patient/Guardian was advised if they have not already done so to contact the registration department to sign all necessary forms in order for [name] to release information regarding their care.   Consent: Patient/Guardian gives verbal consent for treatment and assignment of benefits for services provided during this visit. Patient/Guardian expressed understanding and agreed to proceed.    Alonza Smoker, LCSW 04/09/2022

## 2022-04-15 ENCOUNTER — Ambulatory Visit (HOSPITAL_COMMUNITY)
Admission: RE | Admit: 2022-04-15 | Discharge: 2022-04-15 | Disposition: A | Discharge status: Home / Self Care | Payer: 59 | Source: Ambulatory Visit | Attending: Internal Medicine | Admitting: Internal Medicine

## 2022-04-15 DIAGNOSIS — Z1231 Encounter for screening mammogram for malignant neoplasm of breast: Secondary | ICD-10-CM | POA: Insufficient documentation

## 2022-04-24 DIAGNOSIS — J019 Acute sinusitis, unspecified: Secondary | ICD-10-CM | POA: Diagnosis not present

## 2022-04-30 ENCOUNTER — Telehealth: Payer: Self-pay | Admitting: Psychiatry

## 2022-04-30 NOTE — Telephone Encounter (Signed)
Patient called and left a message wanting to change her appt time on 11/06 from 11:30 to 3. I changed the time and called patient and left a message letting her know it was changed.

## 2022-05-13 ENCOUNTER — Telehealth (HOSPITAL_COMMUNITY): Payer: Self-pay

## 2022-05-13 NOTE — Telephone Encounter (Signed)
Called pt to schedule 30 min f/u with Dr Nehemiah Settle. No answer left vm

## 2022-05-14 NOTE — Telephone Encounter (Signed)
Pt called in will come in today to get help with setting up mychart

## 2022-05-15 ENCOUNTER — Ambulatory Visit (INDEPENDENT_AMBULATORY_CARE_PROVIDER_SITE_OTHER): Payer: 59 | Admitting: Psychiatry

## 2022-05-15 DIAGNOSIS — R69 Illness, unspecified: Secondary | ICD-10-CM | POA: Diagnosis not present

## 2022-05-15 DIAGNOSIS — F251 Schizoaffective disorder, depressive type: Secondary | ICD-10-CM | POA: Diagnosis not present

## 2022-05-15 NOTE — Progress Notes (Signed)
Virtual Visit via Telephone Note  I connected with [name] [name] on 05/15/22 at 4:08 PM EDT  by telephone and verified that I am speaking with the correct person using two identifiers.  Location: Patient: [name] [name]  Provider: [place] office    I discussed the limitations, risks, security and privacy concerns of performing an evaluation and management service by telephone and the availability of in person appointments. I also discussed with the patient that there may be a patient responsible charge related to this service. The patient expressed understanding and agreed to proceed.          I provided 47 minutes of non-face-to-face time during this encounter.   Alonza Smoker, LCSW   THERAPIST PROGRESS NOTE        Session Time:   Wednesday 05/16/2023 4:08 PM - 4:55 PM     Participation Level: Active  Behavioral Response: alert, euthymic  Type of Therapy: Individual Therapy  Treatment Goals addressed: [place] will score less than 10 on the patient health questionnaire  Progress on goals: Progressing  Interventions: Supportive  Summary: [name] [name] is a 61 y.o. female who is a returning patient to this clinician. She  presents with a long-standing history of symptoms of anxiety and depression along with a previous diagnosis of schizoaffective disorder. She  reports a history of at least 3 psychiatric hospitalizations from 70 through 1990.   She has received outpatient services from [facility] and [facility].    Patient last was seen via virtual visit about 5  weeks ago.  She reports continued symptoms of depression but decreased intensity and frequency.  Per patient's report she experiences periods of depression 3 to 4 days/week.  She identifies triggers as worries about interactions with others as well as loneliness.  However, she has increased social involvement since last session.  She reports enjoying 2 motorcycle riding trips in the mountains with one of  her cousins since last session.  She also has resumed attending church.  She reports increased support from a paternal uncle and his wife.  She reports they visit her and have accompanied her to some of her doctors appointments.  She still expresses frustration with some of her other family members but has improved assertive communication along with setting and maintaining limits with one of her cousins.  She expresses less stress regarding interaction with one of her supervisors and admits she has a pattern of thinking the worst about interactions with supervisor.     Suicidal/Homicidal: Nowithout intent/plan patient agrees to call this practice, call 911, have someone take her to the ED should symptoms worsen.  Therapist Response:  reviewed symptoms, praised and reinforced patient's increased involvement and socialization, discussed effects on her mood/thoughts/behavior, explored ways patient can increase social involvement, encouraged patient to follow through with her plan to start attending the senior center, also developed plan with patient to schedule time to meet with a friend,  discussed stressors, facilitated expression of thoughts and feelings, validated feelings, praised and reinforced patient's efforts to improve assertive communication as well as her efforts to set/maintain limits, discussed patient's personal rights to assist patient in efforts to continue to improve assertiveness skills   Plan: Return in 3-4 weeks        Diagnosis: Axis I: Schizoaffective Disorder     Collaboration of Care: Psychiatrist AEB patient works with psychiatrist Dr. Modesta Messing  Patient/Guardian was advised Release of Information must be obtained prior to any record release in order to  collaborate their care with an outside provider. Patient/Guardian was advised if they have not already done so to contact the registration department to sign all necessary forms in order for [name] to release information regarding their  care.   Consent: Patient/Guardian gives verbal consent for treatment and assignment of benefits for services provided during this visit. Patient/Guardian expressed understanding and agreed to proceed.    Alonza Smoker, LCSW 05/15/2022

## 2022-05-23 DIAGNOSIS — R350 Frequency of micturition: Secondary | ICD-10-CM | POA: Diagnosis not present

## 2022-05-23 DIAGNOSIS — N3281 Overactive bladder: Secondary | ICD-10-CM | POA: Diagnosis not present

## 2022-05-23 DIAGNOSIS — M545 Low back pain, unspecified: Secondary | ICD-10-CM | POA: Diagnosis not present

## 2022-05-29 ENCOUNTER — Telehealth (HOSPITAL_COMMUNITY): Payer: 59 | Admitting: Psychiatry

## 2022-05-29 ENCOUNTER — Telehealth: Payer: Self-pay | Admitting: Psychiatry

## 2022-05-30 ENCOUNTER — Encounter (HOSPITAL_COMMUNITY): Payer: Self-pay | Admitting: Psychiatry

## 2022-05-30 ENCOUNTER — Ambulatory Visit (HOSPITAL_COMMUNITY): Payer: 59 | Admitting: Psychiatry

## 2022-05-30 DIAGNOSIS — F251 Schizoaffective disorder, depressive type: Secondary | ICD-10-CM

## 2022-05-30 DIAGNOSIS — R69 Illness, unspecified: Secondary | ICD-10-CM | POA: Diagnosis not present

## 2022-05-30 DIAGNOSIS — R413 Other amnesia: Secondary | ICD-10-CM | POA: Insufficient documentation

## 2022-05-30 DIAGNOSIS — Z5181 Encounter for therapeutic drug level monitoring: Secondary | ICD-10-CM

## 2022-05-30 DIAGNOSIS — F411 Generalized anxiety disorder: Secondary | ICD-10-CM

## 2022-05-30 DIAGNOSIS — F5101 Primary insomnia: Secondary | ICD-10-CM | POA: Diagnosis not present

## 2022-05-30 DIAGNOSIS — R251 Tremor, unspecified: Secondary | ICD-10-CM | POA: Insufficient documentation

## 2022-05-30 DIAGNOSIS — Z79899 Other long term (current) drug therapy: Secondary | ICD-10-CM | POA: Insufficient documentation

## 2022-05-30 NOTE — Patient Instructions (Signed)
We didn't make any changes to your medication today. Please bring all your medication with you to your next visit so we can make sure we have the most up to date regimen for you. Your PCP will help with a neurology referral for tremor.
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
## 2022-06-10 ENCOUNTER — Ambulatory Visit: Payer: Self-pay | Admitting: Psychiatry

## 2022-06-13 DIAGNOSIS — R251 Tremor, unspecified: Secondary | ICD-10-CM | POA: Diagnosis not present

## 2022-06-20 ENCOUNTER — Ambulatory Visit (INDEPENDENT_AMBULATORY_CARE_PROVIDER_SITE_OTHER): Payer: 59 | Admitting: Psychiatry

## 2022-06-20 ENCOUNTER — Telehealth (HOSPITAL_COMMUNITY): Payer: Self-pay | Admitting: Psychiatry

## 2022-06-20 DIAGNOSIS — R69 Illness, unspecified: Secondary | ICD-10-CM | POA: Diagnosis not present

## 2022-06-20 DIAGNOSIS — F251 Schizoaffective disorder, depressive type: Secondary | ICD-10-CM | POA: Diagnosis not present

## 2022-06-20 NOTE — Telephone Encounter (Signed)
Therapist attempted to contact patient via phone for scheduled appointment. Therapist received voicemail message indicating voicemail box is full.

## 2022-06-20 NOTE — Progress Notes (Signed)
Virtual Visit via Telephone Note  I connected with [name] on 06/20/22 at 10:31 AM by telephone and verified that I am speaking with the correct person using two identifiers.  Location: Patient: Car Provider:  [place] office    I discussed the limitations, risks, security and privacy concerns of performing an evaluation and management service by telephone and the availability of in person appointments. I also discussed with the patient that there may be a patient responsible charge related to this service. The patient expressed understanding and agreed to proceed.   I provided 40 minutes of non-face-to-face time during this encounter.   Alonza Smoker, LCSW  THERAPIST PROGRESS NOTE        Session Time:  Thursday 06/20/2022 10:31 AM - 10:51 AM      Participation Level: Active  Behavioral Response: alert, euthymic  Type of Therapy: Individual Therapy  Treatment Goals addressed: [place] will score less than 10 on the patient health questionnaire  Progress on goals: Progressing  Interventions: Supportive  Summary: [name] [name] is a 61 y.o. female who is a returning patient to this clinician. She  presents with a long-standing history of symptoms of anxiety and depression along with a previous diagnosis of schizoaffective disorder. She  reports a history of at least 3 psychiatric hospitalizations from 52 through 1990.   She has received outpatient services from [facility] [place] [facility].    Patient last was seen via virtual visit about 5  weeks ago.  She denies having any symptoms of depression since last session.  She states feeling very loved and having more support. She continues to have strong support from her aunt and uncle per her report.  She reports increased socialization with her cousin and states they still go out to eat, ride the motorcycle, and visit.  She also reports increased involvement with some of her friends reported going out to eat  and is especially happy today as she has gone out with friends to eat breakfast.  Patient states she no longer works on Thursdays due to to income limitations now that she is receiving her deceased husband's Social Security benefits.  Patient reports reduced work schedule has been very helpful and enjoying planning things on Thursdays.  She is looking forward to spending Thanksgiving with her cousin.  She expresses thoughts about not going to her home church like she thinks she should but is not overwhelmed by this as she has been in the past.  She is continuing to look to others charges to attend.  Suicidal/Homicidal: Nowithout intent/plan patient agrees to call this practice, call 911, have someone take her to the ED should symptoms worsen.  Therapist Response:  reviewed symptoms, praised and reinforced patient's increased involvement and socialization, discussed effects on her mood/thoughts/behavior, reviewed personal rights regarding deciding to visit other churches, encouraged patient to maintain consistent social involvement   Plan: Return in 3-4 weeks        Diagnosis: Axis I: Schizoaffective Disorder     Collaboration of Care: Psychiatrist AEB patient works with psychiatrist Dr. Modesta Messing  Patient/Guardian was advised Release of Information must be obtained prior to any record release in order to collaborate their care with an outside provider. Patient/Guardian was advised if they have not already done so to contact the registration department to sign all necessary forms in order for [name] to release information regarding their care.   Consent: Patient/Guardian gives verbal consent for treatment and assignment of benefits for services  provided during this visit. Patient/Guardian expressed understanding and agreed to proceed.    Alonza Smoker, LCSW 06/20/2022

## 2022-07-11 DIAGNOSIS — E782 Mixed hyperlipidemia: Secondary | ICD-10-CM | POA: Diagnosis not present

## 2022-07-11 DIAGNOSIS — E039 Hypothyroidism, unspecified: Secondary | ICD-10-CM | POA: Diagnosis not present

## 2022-07-11 DIAGNOSIS — I10 Essential (primary) hypertension: Secondary | ICD-10-CM | POA: Diagnosis not present

## 2022-07-18 DIAGNOSIS — R251 Tremor, unspecified: Secondary | ICD-10-CM | POA: Diagnosis not present

## 2022-07-18 DIAGNOSIS — R69 Illness, unspecified: Secondary | ICD-10-CM | POA: Diagnosis not present

## 2022-07-18 DIAGNOSIS — E782 Mixed hyperlipidemia: Secondary | ICD-10-CM | POA: Diagnosis not present

## 2022-07-18 DIAGNOSIS — R7303 Prediabetes: Secondary | ICD-10-CM | POA: Diagnosis not present

## 2022-07-18 DIAGNOSIS — E039 Hypothyroidism, unspecified: Secondary | ICD-10-CM | POA: Diagnosis not present

## 2022-07-18 DIAGNOSIS — I10 Essential (primary) hypertension: Secondary | ICD-10-CM | POA: Diagnosis not present

## 2022-07-18 DIAGNOSIS — N3281 Overactive bladder: Secondary | ICD-10-CM | POA: Diagnosis not present

## 2022-08-01 ENCOUNTER — Telehealth (INDEPENDENT_AMBULATORY_CARE_PROVIDER_SITE_OTHER): Payer: 59 | Admitting: Psychiatry

## 2022-08-01 ENCOUNTER — Encounter (HOSPITAL_COMMUNITY): Payer: Self-pay | Admitting: Psychiatry

## 2022-08-01 DIAGNOSIS — Z79899 Other long term (current) drug therapy: Secondary | ICD-10-CM

## 2022-08-01 DIAGNOSIS — F411 Generalized anxiety disorder: Secondary | ICD-10-CM

## 2022-08-01 DIAGNOSIS — R69 Illness, unspecified: Secondary | ICD-10-CM | POA: Diagnosis not present

## 2022-08-01 DIAGNOSIS — F251 Schizoaffective disorder, depressive type: Secondary | ICD-10-CM | POA: Diagnosis not present

## 2022-08-01 DIAGNOSIS — F5101 Primary insomnia: Secondary | ICD-10-CM | POA: Diagnosis not present

## 2022-08-01 DIAGNOSIS — Z5181 Encounter for therapeutic drug level monitoring: Secondary | ICD-10-CM

## 2022-08-01 MED ORDER — ZIPRASIDONE HCL 40 MG PO CAPS: 40.0000 mg | ORAL_CAPSULE | Freq: Two times a day (BID) | ORAL | 0 refills | Status: DC

## 2022-08-01 MED ORDER — FLUOXETINE HCL 40 MG PO CAPS
40.0000 mg | ORAL_CAPSULE | Freq: Every day | ORAL | 1 refills | Status: DC
Start: 2022-08-01 — End: 2022-10-10

## 2022-08-01 MED ORDER — DIVALPROEX SODIUM 500 MG PO DR TAB: 500.0000 mg | DELAYED_RELEASE_TABLET | Freq: Two times a day (BID) | ORAL | 0 refills | Status: DC
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
## 2022-08-01 NOTE — Patient Instructions (Signed)
We did not make any medication changes today but please coordinate with your primary care doctor to do a memory test.  Please also have your primary care doctor obtain an ECG.

## 2022-08-08 ENCOUNTER — Ambulatory Visit (HOSPITAL_COMMUNITY): Payer: 59 | Admitting: Psychiatry

## 2022-08-08 DIAGNOSIS — R413 Other amnesia: Secondary | ICD-10-CM | POA: Diagnosis not present

## 2022-08-08 DIAGNOSIS — F251 Schizoaffective disorder, depressive type: Secondary | ICD-10-CM | POA: Diagnosis not present

## 2022-08-08 DIAGNOSIS — R69 Illness, unspecified: Secondary | ICD-10-CM | POA: Diagnosis not present
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
## 2022-08-13 ENCOUNTER — Ambulatory Visit: Payer: 59 | Admitting: Gastroenterology

## 2022-08-15 ENCOUNTER — Ambulatory Visit (HOSPITAL_COMMUNITY): Payer: 59 | Admitting: Psychiatry

## 2022-08-15 DIAGNOSIS — F251 Schizoaffective disorder, depressive type: Secondary | ICD-10-CM | POA: Diagnosis not present

## 2022-08-15 DIAGNOSIS — R69 Illness, unspecified: Secondary | ICD-10-CM | POA: Diagnosis not present

## 2022-08-15 NOTE — Progress Notes (Signed)
IN- PERSON THERAPIST PROGRESS NOTE        Session Time:  Thursday 08/15/2022  4:15 PM - 4:55 PM      Participation Level: Active  Behavioral Response: alert, anxious, euthymic  Type of Therapy: Individual Therapy  Treatment Goals addressed: [name] will score less than 10 on the patient health questionnaire  Progress on goals: Progressing  Interventions: Supportive  Summary: [name] [name] is a 62 y.o. female who is a returning patient to this clinician. She  presents with a long-standing history of symptoms of anxiety and depression along with a previous diagnosis of schizoaffective disorder. She  reports a history of at least 3 psychiatric hospitalizations from 47 through 1990.   She has received outpatient services from [facility] and [facility].     Patient last was seen via virtual visit about a week ago.  She is seen emergently today due to experiencing increased depressed mood.  Per patient's report, this was triggered by family members not initiating contact with patient to inform her of her 47 year old aunt's death last week.  She reports calling another ought to check on the welfare of 30 year old aunt and was informed of the aunt's death at that time.  Patient reports having thoughts of other family members not informing her because they are angry with her about the recent incident of patient's aunt and uncle's accusation of patient lying.  This triggered spiraling thoughts of negative thoughts no one caring about her and negative thoughts about self.  Patient reports being very upset at work regarding this and being encouraged by her supervisor to contact therapist.   Suicidal/Homicidal: Nowithout intent/plan patient agrees to call this practice, call 911, have someone take her to the ED should symptoms worsen.  Therapist Response:  reviewed symptoms, discussed stressors, facilitated expression of thoughts and feelings, validated feelings, assisted patient  identify/challenge/and replace negative thoughts with more rational thoughts, assisted patient develop list of coping statements and evidence contrary to her negative thoughts, developed plan with patient to read list daily  Plan: Return in 3-4 weeks        Diagnosis: Axis I: Schizoaffective Disorder     Collaboration of Care: Psychiatrist AEB patient works with psychiatrist Dr. Modesta Messing  Patient/Guardian was advised Release of Information must be obtained prior to any record release in order to collaborate their care with an outside provider. Patient/Guardian was advised if they have not already done so to contact the registration department to sign all necessary forms in order for [name] to release information regarding their care.   Consent: Patient/Guardian gives verbal consent for treatment and assignment of benefits for services provided during this visit. Patient/Guardian expressed understanding and agreed to proceed.    Alonza Smoker, LCSW 08/15/2022

## 2022-08-27 DIAGNOSIS — J069 Acute upper respiratory infection, unspecified: Secondary | ICD-10-CM | POA: Diagnosis not present

## 2022-08-27 DIAGNOSIS — R059 Cough, unspecified: Secondary | ICD-10-CM | POA: Diagnosis not present

## 2022-08-27 DIAGNOSIS — Z20822 Contact with and (suspected) exposure to covid-19: Secondary | ICD-10-CM | POA: Diagnosis not present

## 2022-08-30 DIAGNOSIS — J029 Acute pharyngitis, unspecified: Secondary | ICD-10-CM | POA: Diagnosis not present

## 2022-08-30 DIAGNOSIS — R5383 Other fatigue: Secondary | ICD-10-CM | POA: Diagnosis not present

## 2022-09-19 ENCOUNTER — Ambulatory Visit (HOSPITAL_COMMUNITY): Payer: 59 | Admitting: Psychiatry

## 2022-09-19 ENCOUNTER — Encounter (HOSPITAL_COMMUNITY): Payer: Self-pay

## 2022-09-19 DIAGNOSIS — R69 Illness, unspecified: Secondary | ICD-10-CM | POA: Diagnosis not present

## 2022-09-19 DIAGNOSIS — F251 Schizoaffective disorder, depressive type: Secondary | ICD-10-CM

## 2022-09-19 NOTE — Progress Notes (Signed)
IN- PERSON THERAPIST PROGRESS NOTE        Session Time:  Thursday 09/19/2022  9:03 AM - 9:55 AM      Participation Level: Active  Behavioral Response: alert, anxious,   Type of Therapy: Individual Therapy  Treatment Goals addressed: Layan will score less than 10 on the patient health questionnaire  Progress on goals: Progressing  Interventions: Supportive  Summary: NAYSHA TUFFY is a 62 y.o. female who is a returning patient to this clinician. She  presents with a long-standing history of symptoms of anxiety and depression along with a previous diagnosis of schizoaffective disorder. She  reports a history of at least 3 psychiatric hospitalizations from 13 through 1990.   She has received outpatient services from [facility] and [facility].     Patient last was seen via virtual visit about 3-4 weeks ago.  She reports increased fatigue but attributes this mainly to having the flu and an upper respiratory infection about 2 weeks ago.  She is beginning to have more energy and resume involvement in activity.  She expresses sadness and frustration as well as guilt regarding the relationship with her aunt and uncle.  Per patient's report, she called her aunt to apologize and  try to explain incident that occurred when aunt and uncle accused patient of lying.  She expresses hurt and disappointment aunt has not made contact with her since that phone conversation.  Patient reports continued support from other family members and friends.  She also reports increased efforts using assertiveness skills and cites a recent example.   Suicidal/Homicidal: Nowithout intent/plan patient agrees to call this practice, call 911, have someone take her to the ED should symptoms worsen.  Therapist Response:  reviewed symptoms, administered PHQ 2-9, discussed results, discussed stressors, facilitated expression of thoughts and feelings, validated feelings, assisted patient review personal rights to help dispel  inappropriate guilt regarding recent incident regarding aunt and  uncle, patient reinforced patient's efforts to set and maintain limits in the situation with her friend, discussed effects, reviewed treatment plan, obtained patient's permission to electronically sign plan for patient, discussed next steps for treatment, also encouraged patient to continue reading her list of coping statements   Plan: Return in 3-4 weeks        Diagnosis: Axis I: Schizoaffective Disorder     Collaboration of Care: Psychiatrist AEB patient works with psychiatrist Dr. Modesta Messing  Patient/Guardian was advised Release of Information must be obtained prior to any record release in order to collaborate their care with an outside provider. Patient/Guardian was advised if they have not already done so to contact the registration department to sign all necessary forms in order for [name] to release information regarding their care.   Consent: Patient/Guardian gives verbal consent for treatment and assignment of benefits for services provided during this visit. Patient/Guardian expressed understanding and agreed to proceed.    Alonza Smoker, LCSW 09/19/2022

## 2022-09-26 DIAGNOSIS — R42 Dizziness and giddiness: Secondary | ICD-10-CM | POA: Diagnosis not present

## 2022-10-03 ENCOUNTER — Ambulatory Visit (HOSPITAL_COMMUNITY): Payer: 59 | Admitting: Psychiatry

## 2022-10-03 DIAGNOSIS — F251 Schizoaffective disorder, depressive type: Secondary | ICD-10-CM | POA: Diagnosis not present

## 2022-10-03 NOTE — Progress Notes (Signed)
IN- PERSON THERAPIST PROGRESS NOTE        Session Time:  Thursday 10/03/2022  10:06 AM -  10:50 AM      Participation Level: Active  Behavioral Response: alert, anxious,   Type of Therapy: Individual Therapy  Treatment Goals addressed: Karliah will score less than 10 on the patient health questionnaire  Progress on goals: Progressing  Interventions: Supportive/CBT  Summary: [name] [name] is a 62 y.o. female who is a returning patient to this clinician. She  presents with a long-standing history of symptoms of anxiety and depression along with a previous diagnosis of schizoaffective disorder. She  reports a history of at least 3 psychiatric hospitalizations from 32 through 1990.   She has received outpatient services from [facility] and [facility].     Patient last was seen via virtual visit about 3-4 weeks ago.  She reports decreased intensity, frequency, and duration of depressive symptoms since last session as reflected in the PHQ 2 and 9.  She is maintaining involvement in activity but realizes she is pushing self to be involved in too many activities at times especially regarding going out to eat with her cousin.  She reports increased fatigue.  She reports and proved use of assertiveness skills and setting and maintaining limits with her relatives and cites examples.  She also reports improved use of assertive communication at work.  Patient reports no longer experiencing negative thoughts about self or self blame regarding past conflict with aunt and uncle.  She also reports no longer dwelling on the situation.  She reports continued support from other family members and her friend.    Suicidal/Homicidal: Nowithout intent/plan patient agrees to call this practice, call 911, have someone take her to the ED should symptoms worsen.  Therapist Response:  reviewed symptoms, administered PHQ 2-9, discussed results, praised and reinforced patient's use of assertiveness skills with her  relatives and at work, discussed effects, discussed stressors, facilitated expression of thoughts and feelings, validated feelings, assisted patient identify ways to set and maintain limits with her cousin as well as ways to ensure she schedules time for self and self care   Plan: Return in 3-4 weeks        Diagnosis: Axis I: Schizoaffective Disorder     Collaboration of Care: Psychiatrist AEB patient works with psychiatrist Dr. Modesta Messing  Patient/Guardian was advised Release of Information must be obtained prior to any record release in order to collaborate their care with an outside provider. Patient/Guardian was advised if they have not already done so to contact the registration department to sign all necessary forms in order for [name] to release information regarding their care.   Consent: Patient/Guardian gives verbal consent for treatment and assignment of benefits for services provided during this visit. Patient/Guardian expressed understanding and agreed to proceed.    Alonza Smoker, LCSW 10/03/2022

## 2022-10-10 ENCOUNTER — Encounter (HOSPITAL_COMMUNITY): Payer: Self-pay | Admitting: Psychiatry

## 2022-10-10 ENCOUNTER — Telehealth (INDEPENDENT_AMBULATORY_CARE_PROVIDER_SITE_OTHER): Payer: 59 | Admitting: Psychiatry

## 2022-10-10 DIAGNOSIS — F251 Schizoaffective disorder, depressive type: Secondary | ICD-10-CM

## 2022-10-10 DIAGNOSIS — Z79899 Other long term (current) drug therapy: Secondary | ICD-10-CM

## 2022-10-10 DIAGNOSIS — F5101 Primary insomnia: Secondary | ICD-10-CM | POA: Diagnosis not present

## 2022-10-10 DIAGNOSIS — F411 Generalized anxiety disorder: Secondary | ICD-10-CM | POA: Diagnosis not present

## 2022-10-10 DIAGNOSIS — Z5181 Encounter for therapeutic drug level monitoring: Secondary | ICD-10-CM

## 2022-10-10 MED ORDER — FLUOXETINE HCL 40 MG PO CAPS: 40.0000 mg | ORAL_CAPSULE | Freq: Every day | ORAL | 1 refills | Status: DC

## 2022-10-10 MED ORDER — ZIPRASIDONE HCL 40 MG PO CAPS: 40.0000 mg | ORAL_CAPSULE | Freq: Two times a day (BID) | ORAL | 0 refills | Status: DC

## 2022-10-10 MED ORDER — DIVALPROEX SODIUM 500 MG PO DR TAB: 500.0000 mg | DELAYED_RELEASE_TABLET | Freq: Two times a day (BID) | ORAL | 0 refills | Status: DC
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
## 2022-10-10 NOTE — Patient Instructions (Signed)
We did not make any medication changes today.  Please coordinate with your primary care doctor's office to obtain an EKG, lipid panel, A1c, Depakote level, CBC, CMP.  Also if they can send over the memory test results that would be great.

## 2022-10-23 ENCOUNTER — Encounter: Payer: 59 | Admitting: Gastroenterology

## 2022-10-31 ENCOUNTER — Ambulatory Visit (HOSPITAL_COMMUNITY): Payer: 59 | Admitting: Psychiatry

## 2022-10-31 DIAGNOSIS — F251 Schizoaffective disorder, depressive type: Secondary | ICD-10-CM | POA: Diagnosis not present

## 2022-10-31 NOTE — Progress Notes (Signed)
IN- PERSON THERAPIST PROGRESS NOTE        Session Time:  Thursday 10/31/2022  11:06 AM -  11:49 AM      Participation Level: Active  Behavioral Response: alert, anxious,   Type of Therapy: Individual Therapy  Treatment Goals addressed: [name] will score less than 10 on the patient health questionnaire  Progress on goals: Progressing  Interventions: Supportive/CBT  Summary: [name] [name] is a 62 y.o. female who is a returning patient to this clinician. She  presents with a long-standing history of symptoms of anxiety and depression along with a previous diagnosis of schizoaffective disorder. She  reports a history of at least 3 psychiatric hospitalizations from 42 through 1990.   She has received outpatient services from [facility] and [facility].     Patient last was seen via virtual visit about 3-4 weeks ago.  Patient is distressed today as she reports receiving message from her bank regarding an unauthorized purchase from her account.  She reports this triggered increased sleep difficulty last night and spiraling negative thoughts.  She verbalizes negative thoughts about self and expresses frustration about thoughts and her feelings regarding her interaction with her cousin.  She is pleased with her recent efforts to set and maintain limits with one of her cousins and cites two examples.  She still has difficulty scheduling time for self and reports this results in continued fatigue as she is not getting enough rest.     Suicidal/Homicidal: Nowithout intent/plan patient agrees to call this practice, call 911, have someone take her to the ED should symptoms worsen.  Therapist Response:  reviewed symptoms, discussed stressors, facilitated expression of thoughts and feelings, validated feelings, discussed self compassion and assisted patient identify realistic expectations of self, praised and reinforced patient's use of assertiveness skills with cousin, discussed effects on  thoughts/mood/behavior, assisted patient  identify ways to continue to set maintain limits that align with her values regarding interaction with her cousin, encouraged patient to schedule time for self to ensure adequate time for rest and sleep . Plan: Return in 3-4 weeks        Diagnosis: Axis I: Schizoaffective Disorder     Collaboration of Care: Psychiatrist AEB patient works with psychiatrist Dr. Modesta Messing  Patient/Guardian was advised Release of Information must be obtained prior to any record release in order to collaborate their care with an outside provider. Patient/Guardian was advised if they have not already done so to contact the registration department to sign all necessary forms in order for [name] to release information regarding their care.   Consent: Patient/Guardian gives verbal consent for treatment and assignment of benefits for services provided during this visit. Patient/Guardian expressed understanding and agreed to proceed.    Alonza Smoker, LCSW 10/31/2022

## 2022-11-14 ENCOUNTER — Ambulatory Visit (HOSPITAL_COMMUNITY): Payer: 59 | Admitting: Psychiatry

## 2022-11-14 DIAGNOSIS — F251 Schizoaffective disorder, depressive type: Secondary | ICD-10-CM | POA: Diagnosis not present

## 2022-11-14 DIAGNOSIS — Z79899 Other long term (current) drug therapy: Secondary | ICD-10-CM | POA: Diagnosis not present

## 2022-11-14 DIAGNOSIS — N3281 Overactive bladder: Secondary | ICD-10-CM | POA: Diagnosis not present

## 2022-11-14 DIAGNOSIS — R413 Other amnesia: Secondary | ICD-10-CM | POA: Diagnosis not present

## 2022-11-14 DIAGNOSIS — R42 Dizziness and giddiness: Secondary | ICD-10-CM | POA: Diagnosis not present
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
## 2022-11-15 LAB — COMPREHENSIVE METABOLIC PANEL: EGFR: 100

## 2022-11-16 DIAGNOSIS — Z79890 Hormone replacement therapy (postmenopausal): Secondary | ICD-10-CM | POA: Diagnosis not present

## 2022-11-16 DIAGNOSIS — J45909 Unspecified asthma, uncomplicated: Secondary | ICD-10-CM | POA: Diagnosis not present

## 2022-11-16 DIAGNOSIS — Z79899 Other long term (current) drug therapy: Secondary | ICD-10-CM | POA: Diagnosis not present

## 2022-11-16 DIAGNOSIS — I10 Essential (primary) hypertension: Secondary | ICD-10-CM | POA: Diagnosis not present

## 2022-11-16 DIAGNOSIS — J069 Acute upper respiratory infection, unspecified: Secondary | ICD-10-CM | POA: Diagnosis not present

## 2022-11-16 DIAGNOSIS — E039 Hypothyroidism, unspecified: Secondary | ICD-10-CM | POA: Diagnosis not present

## 2022-11-28 ENCOUNTER — Ambulatory Visit (INDEPENDENT_AMBULATORY_CARE_PROVIDER_SITE_OTHER): Payer: 59 | Admitting: Psychiatry

## 2022-11-28 ENCOUNTER — Encounter: Payer: Self-pay | Admitting: Gastroenterology

## 2022-11-28 ENCOUNTER — Telehealth (HOSPITAL_COMMUNITY): Payer: Self-pay | Admitting: *Deleted

## 2022-11-28 ENCOUNTER — Ambulatory Visit (INDEPENDENT_AMBULATORY_CARE_PROVIDER_SITE_OTHER): Payer: 59 | Admitting: Gastroenterology

## 2022-11-28 VITALS — BP 92/54 | HR 73 | Temp 97.7°F | Ht 61.0 in | Wt 193.8 lb

## 2022-11-28 DIAGNOSIS — K529 Noninfective gastroenteritis and colitis, unspecified: Secondary | ICD-10-CM | POA: Diagnosis not present

## 2022-11-28 DIAGNOSIS — F251 Schizoaffective disorder, depressive type: Secondary | ICD-10-CM | POA: Diagnosis not present
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
## 2022-11-28 NOTE — Telephone Encounter (Signed)
Opened in Error.
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
## 2022-11-28 NOTE — Patient Instructions (Signed)
I am so glad things have evened out!  Please call if you feel like you need any hemorrhoid bandings in the future. Call if you have any recurrent looser stool.  We will see you back as needed!   I enjoyed seeing you again today! At our first visit, I mentioned how I value our relationship and want to provide genuine, compassionate, and quality care. You may receive a survey regarding your visit with me, and I welcome your feedback! Thanks so much for taking the time to complete this. I look forward to seeing you again.   Gelene Mink, PhD, ANP-BC [facility]

## 2022-11-29 DIAGNOSIS — J069 Acute upper respiratory infection, unspecified: Secondary | ICD-10-CM | POA: Diagnosis not present

## 2022-11-29 DIAGNOSIS — R413 Other amnesia: Secondary | ICD-10-CM | POA: Diagnosis not present

## 2022-11-29 DIAGNOSIS — Z79890 Hormone replacement therapy (postmenopausal): Secondary | ICD-10-CM | POA: Diagnosis not present

## 2022-11-29 DIAGNOSIS — Z6834 Body mass index (BMI) 34.0-34.9, adult: Secondary | ICD-10-CM | POA: Diagnosis not present

## 2022-11-29 DIAGNOSIS — E039 Hypothyroidism, unspecified: Secondary | ICD-10-CM | POA: Diagnosis not present

## 2022-11-29 DIAGNOSIS — Z79899 Other long term (current) drug therapy: Secondary | ICD-10-CM | POA: Diagnosis not present

## 2022-11-29 DIAGNOSIS — I10 Essential (primary) hypertension: Secondary | ICD-10-CM | POA: Diagnosis not present

## 2022-11-29 DIAGNOSIS — F209 Schizophrenia, unspecified: Secondary | ICD-10-CM | POA: Diagnosis not present

## 2022-11-29 DIAGNOSIS — J45909 Unspecified asthma, uncomplicated: Secondary | ICD-10-CM | POA: Diagnosis not present

## 2023-01-13 ENCOUNTER — Encounter (HOSPITAL_COMMUNITY): Payer: Self-pay | Admitting: Psychiatry

## 2023-01-13 ENCOUNTER — Telehealth (HOSPITAL_COMMUNITY): Payer: 59 | Admitting: Psychiatry

## 2023-01-13 DIAGNOSIS — F411 Generalized anxiety disorder: Secondary | ICD-10-CM | POA: Diagnosis not present

## 2023-01-13 DIAGNOSIS — F251 Schizoaffective disorder, depressive type: Secondary | ICD-10-CM

## 2023-01-13 DIAGNOSIS — Z79899 Other long term (current) drug therapy: Secondary | ICD-10-CM | POA: Diagnosis not present

## 2023-01-13 DIAGNOSIS — Z5181 Encounter for therapeutic drug level monitoring: Secondary | ICD-10-CM

## 2023-01-13 MED ORDER — ZIPRASIDONE HCL 40 MG PO CAPS: 40.0000 mg | ORAL_CAPSULE | Freq: Two times a day (BID) | ORAL | 1 refills | Status: DC

## 2023-01-13 MED ORDER — DIVALPROEX SODIUM 500 MG PO DR TAB: 500.0000 mg | DELAYED_RELEASE_TABLET | Freq: Two times a day (BID) | ORAL | 1 refills | Status: DC

## 2023-01-13 MED ORDER — FLUOXETINE HCL 40 MG PO CAPS: 40.0000 mg | ORAL_CAPSULE | Freq: Every day | ORAL | 1 refills | Status: DC
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
## 2023-01-13 NOTE — Patient Instructions (Addendum)
We did not make any medication changes today.  Keep taking the fluoxetine (Prozac) 40 mg once daily, ziprasidone 40 mg twice daily, Depakote 500 mg twice daily.  Blood work all looks good and we will not need to get it again for another year.

## 2023-01-15 ENCOUNTER — Ambulatory Visit (HOSPITAL_COMMUNITY): Payer: 59 | Admitting: Psychiatry

## 2023-01-15 DIAGNOSIS — F251 Schizoaffective disorder, depressive type: Secondary | ICD-10-CM

## 2023-01-15 DIAGNOSIS — F411 Generalized anxiety disorder: Secondary | ICD-10-CM | POA: Diagnosis not present
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
## 2023-01-23 ENCOUNTER — Telehealth (HOSPITAL_COMMUNITY): Payer: 59 | Admitting: Psychiatry

## 2023-01-23 DIAGNOSIS — I10 Essential (primary) hypertension: Secondary | ICD-10-CM | POA: Diagnosis not present

## 2023-01-23 DIAGNOSIS — Z87891 Personal history of nicotine dependence: Secondary | ICD-10-CM | POA: Diagnosis not present

## 2023-01-23 DIAGNOSIS — J069 Acute upper respiratory infection, unspecified: Secondary | ICD-10-CM | POA: Diagnosis not present

## 2023-01-23 DIAGNOSIS — Z79899 Other long term (current) drug therapy: Secondary | ICD-10-CM | POA: Diagnosis not present

## 2023-01-23 DIAGNOSIS — J45909 Unspecified asthma, uncomplicated: Secondary | ICD-10-CM | POA: Diagnosis not present

## 2023-01-25 DIAGNOSIS — W01198A Fall on same level from slipping, tripping and stumbling with subsequent striking against other object, initial encounter: Secondary | ICD-10-CM | POA: Diagnosis not present

## 2023-01-25 DIAGNOSIS — W010XXA Fall on same level from slipping, tripping and stumbling without subsequent striking against object, initial encounter: Secondary | ICD-10-CM | POA: Diagnosis not present

## 2023-01-25 DIAGNOSIS — R58 Hemorrhage, not elsewhere classified: Secondary | ICD-10-CM | POA: Diagnosis not present

## 2023-01-25 DIAGNOSIS — S8002XA Contusion of left knee, initial encounter: Secondary | ICD-10-CM | POA: Diagnosis not present

## 2023-01-25 DIAGNOSIS — S0181XA Laceration without foreign body of other part of head, initial encounter: Secondary | ICD-10-CM | POA: Diagnosis not present

## 2023-01-25 DIAGNOSIS — I10 Essential (primary) hypertension: Secondary | ICD-10-CM | POA: Diagnosis not present

## 2023-01-25 DIAGNOSIS — S0083XA Contusion of other part of head, initial encounter: Secondary | ICD-10-CM | POA: Diagnosis not present

## 2023-01-25 DIAGNOSIS — M79643 Pain in unspecified hand: Secondary | ICD-10-CM | POA: Diagnosis not present

## 2023-01-25 DIAGNOSIS — F32A Depression, unspecified: Secondary | ICD-10-CM | POA: Diagnosis not present

## 2023-01-25 DIAGNOSIS — S8001XA Contusion of right knee, initial encounter: Secondary | ICD-10-CM | POA: Diagnosis not present

## 2023-01-25 DIAGNOSIS — W19XXXA Unspecified fall, initial encounter: Secondary | ICD-10-CM | POA: Diagnosis not present

## 2023-01-27 DIAGNOSIS — S0181XD Laceration without foreign body of other part of head, subsequent encounter: Secondary | ICD-10-CM | POA: Diagnosis not present

## 2023-01-27 DIAGNOSIS — S8001XD Contusion of right knee, subsequent encounter: Secondary | ICD-10-CM | POA: Diagnosis not present

## 2023-01-27 DIAGNOSIS — S8002XD Contusion of left knee, subsequent encounter: Secondary | ICD-10-CM | POA: Diagnosis not present

## 2023-01-27 DIAGNOSIS — W19XXXD Unspecified fall, subsequent encounter: Secondary | ICD-10-CM | POA: Diagnosis not present

## 2023-01-27 DIAGNOSIS — Z79899 Other long term (current) drug therapy: Secondary | ICD-10-CM | POA: Diagnosis not present

## 2023-01-28 ENCOUNTER — Telehealth (HOSPITAL_COMMUNITY): Payer: 59 | Admitting: Psychiatry

## 2023-01-28 ENCOUNTER — Encounter (HOSPITAL_COMMUNITY): Payer: Self-pay | Admitting: Psychiatry

## 2023-01-28 DIAGNOSIS — Z79899 Other long term (current) drug therapy: Secondary | ICD-10-CM | POA: Diagnosis not present

## 2023-01-28 DIAGNOSIS — F411 Generalized anxiety disorder: Secondary | ICD-10-CM

## 2023-01-28 DIAGNOSIS — F251 Schizoaffective disorder, depressive type: Secondary | ICD-10-CM | POA: Diagnosis not present

## 2023-01-28 DIAGNOSIS — Z5181 Encounter for therapeutic drug level monitoring: Secondary | ICD-10-CM | POA: Diagnosis not present

## 2023-01-28 DIAGNOSIS — R296 Repeated falls: Secondary | ICD-10-CM | POA: Insufficient documentation

## 2023-01-28 DIAGNOSIS — R251 Tremor, unspecified: Secondary | ICD-10-CM | POA: Diagnosis not present

## 2023-01-28 DIAGNOSIS — R413 Other amnesia: Secondary | ICD-10-CM

## 2023-01-28 DIAGNOSIS — W19XXXA Unspecified fall, initial encounter: Secondary | ICD-10-CM
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
## 2023-01-29 ENCOUNTER — Ambulatory Visit (HOSPITAL_COMMUNITY): Payer: 59 | Admitting: Psychiatry

## 2023-01-30 DIAGNOSIS — Z4802 Encounter for removal of sutures: Secondary | ICD-10-CM | POA: Diagnosis not present

## 2023-02-03 DIAGNOSIS — R2689 Other abnormalities of gait and mobility: Secondary | ICD-10-CM | POA: Diagnosis not present

## 2023-02-03 DIAGNOSIS — F209 Schizophrenia, unspecified: Secondary | ICD-10-CM | POA: Diagnosis not present

## 2023-02-05 DIAGNOSIS — I10 Essential (primary) hypertension: Secondary | ICD-10-CM | POA: Diagnosis not present

## 2023-02-05 DIAGNOSIS — E782 Mixed hyperlipidemia: Secondary | ICD-10-CM | POA: Diagnosis not present

## 2023-02-05 DIAGNOSIS — E039 Hypothyroidism, unspecified: Secondary | ICD-10-CM | POA: Diagnosis not present

## 2023-02-08 ENCOUNTER — Emergency Department (HOSPITAL_COMMUNITY)
Admission: EM | Admit: 2023-02-08 | Discharge: 2023-02-08 | Disposition: A | Discharge status: Home / Self Care | Payer: 59 | Attending: Emergency Medicine | Admitting: Emergency Medicine

## 2023-02-08 ENCOUNTER — Emergency Department (HOSPITAL_COMMUNITY): Payer: 59

## 2023-02-08 ENCOUNTER — Encounter (HOSPITAL_COMMUNITY): Payer: Self-pay

## 2023-02-08 ENCOUNTER — Other Ambulatory Visit: Payer: Self-pay

## 2023-02-08 DIAGNOSIS — S5001XA Contusion of right elbow, initial encounter: Secondary | ICD-10-CM | POA: Insufficient documentation

## 2023-02-08 DIAGNOSIS — M25521 Pain in right elbow: Secondary | ICD-10-CM | POA: Diagnosis not present

## 2023-02-08 DIAGNOSIS — S5001XD Contusion of right elbow, subsequent encounter: Secondary | ICD-10-CM | POA: Diagnosis not present

## 2023-02-08 DIAGNOSIS — S59901A Unspecified injury of right elbow, initial encounter: Secondary | ICD-10-CM | POA: Diagnosis not present

## 2023-02-08 DIAGNOSIS — W19XXXA Unspecified fall, initial encounter: Secondary | ICD-10-CM | POA: Diagnosis not present

## 2023-02-08 NOTE — Discharge Instructions (Addendum)
Seen today for injury to your right elbow.  X-ray shows arthritis but no broken bones.  Follow-up closely with your primary care doctor you can use Tylenol or ibuprofen as needed for pain.  You are given a work note for tomorrow so you can rest.

## 2023-02-08 NOTE — ED Triage Notes (Signed)
Pt arrived via POV c/o right arm injury that occurred from a fall 2 weeks ago. Pt reports she never came to have it evaluated or have it X-Rayed and is concerned she may not be able to return to work.

## 2023-02-08 NOTE — ED Provider Notes (Signed)
[place] EMERGENCY DEPARTMENT AT [facility] Provider Note   CSN: [id number] Arrival date & time: 02/08/23  4098     History  Chief Complaint  Patient presents with   [name] [name] is a 62 y.o. female.  Has history of schizophrenia, who presents the ER fo right elbow pain.  States she fell on 6/22 and went to ED and.  She had a laceration of her lip but had also injured her right arm.  She states she still having pain and has to go back to work tomorrow and wanted x-rays to figure out what is going on a work note for tomorrow as well.  She denies numbness or tingling, denies interval trauma.  States they did not do x-rays at that time.   Fall       Home Medications Prior to Admission medications   Medication Sig Start Date End Date Taking? Authorizing Provider  Ascorbic Acid (VITAMIN C) 1000 MG tablet Take 1,000 mg by mouth daily.    [provider]  atorvastatin (LIPITOR) 20 MG tablet Take 20 mg by mouth daily.    [provider]  Calcium Citrate-Vitamin D (CALCIUM CITRATE + D3 PO) Take 1 tablet by mouth daily.    [provider]  divalproex (DEPAKOTE) 500 MG DR tablet Take 1 tablet (500 mg total) by mouth 2 (two) times daily. 01/13/23 07/12/23  Elsie Lincoln, MD  FLUoxetine (PROZAC) 40 MG capsule Take 1 capsule (40 mg total) by mouth daily. 01/13/23 07/12/23  Elsie Lincoln, MD  hydrochlorothiazide (HYDRODIURIL) 12.5 MG tablet Take 12.5 mg by mouth daily. 07/19/20   [provider]  ibuprofen (ADVIL,MOTRIN) 200 MG tablet Take 400 mg by mouth 2 (two) times daily as needed for headache.    [provider]  levothyroxine (SYNTHROID) 75 MCG tablet Take 75 mcg by mouth every morning. 01/15/22   [provider]  loperamide (IMODIUM) 2 MG capsule Take by mouth as needed for diarrhea or loose stools.    [provider]  Multiple Vitamin (MULTIVITAMIN WITH MINERALS) TABS tablet Take 1 tablet by mouth  daily.    [provider]  Omega-3 Fatty Acids (FISH OIL) 1000 MG CAPS Take 1,000 mg by mouth daily.    [provider]  oxybutynin (DITROPAN-XL) 5 MG 24 hr tablet Take 5 mg by mouth at bedtime. 06/04/22   [provider]  propranolol (INDERAL) 10 MG tablet Take 20 mg by mouth 2 (two) times daily.    [provider]  ziprasidone (GEODON) 40 MG capsule Take 1 capsule (40 mg total) by mouth 2 (two) times daily with a meal. 01/13/23 07/12/23  Elsie Lincoln, MD      Allergies    Omnicef [cefdinir] and Penicillins    Review of Systems   Review of Systems  Physical Exam Updated Vital Signs BP 125/62 (BP Location: Left Arm)   Pulse 81   Temp 98.7 F (37.1 C) (Oral)   Resp 16   Ht 5\' 1"  (1.549 m)   Wt 88 kg   LMP 03/18/2012   SpO2 96%   BMI 36.66 kg/m  Physical Exam Vitals and nursing note reviewed.  Constitutional:      General: She is not in acute distress.    Appearance: She is well-developed.  HENT:     Head: Normocephalic and atraumatic.  Eyes:     Conjunctiva/sclera: Conjunctivae normal.  Cardiovascular:     Rate and Rhythm:  Normal rate and regular rhythm.     Heart sounds: No murmur heard. Pulmonary:     Effort: Pulmonary effort is normal. No respiratory distress.     Breath sounds: Normal breath sounds.  Abdominal:     Palpations: Abdomen is soft.     Tenderness: There is no abdominal tenderness.  Musculoskeletal:        General: No swelling.     Cervical back: Neck supple.     Comments: Tenderness and small ecchymosis noted to medial aspect of right elbow with no crepitus or swelling.  Normal range of motion of right elbow wrist and shoulder.  Radial pulses 2+.  Sensation intact in right upper extremity.  Skin:    General: Skin is warm and dry.     Capillary Refill: Capillary refill takes less than 2 seconds.  Neurological:     General: No focal deficit present.     Mental Status: She is alert and oriented to person, place,  and time.  Psychiatric:        Mood and Affect: Mood normal.     ED Results / Procedures / Treatments   Labs (all labs ordered are listed, but only abnormal results are displayed) Labs Reviewed - No data to display  EKG None  Radiology DG Elbow Complete Right  Result Date: 02/08/2023 CLINICAL DATA:  Fall and injury 1 week ago. Persistent right elbow pain. EXAM: RIGHT ELBOW - COMPLETE 3+ VIEW COMPARISON:  Elbow radiograph 04/01/2019 FINDINGS: No fracture or dislocation. No definite elbow joint effusion. Prominent degenerative changes with osteophytes, spurring and intra-articular bodies, progressed from 2020 exam. Linear calcifications in the volar soft tissues is chronic and unchanged. IMPRESSION: 1. No fracture or dislocation of the right elbow. 2. Prominent degenerative change with intra-articular bodies, progressed from 2020 exam. Electronically Signed   By: Narda Rutherford M.D.   On: 02/08/2023 20:28    Procedures Procedures    Medications Ordered in ED Medications - No data to display  ED Course/ Medical Decision Making/ A&P                             Medical Decision Making DDx: Fracture, sprain, strain, contusion, dislocation, other  Course: Patient wanted x-rays of her right elbow after fall 2 weeks ago, Because she still having pain. she has arthritis on her x-ray but no fracture or dislocation.  Discussed supportive care, she is given Ace wrap, advised on follow-up and return precautions.  Amount and/or Complexity of Data Reviewed Radiology: ordered and independent interpretation performed.    Details: No fracture or dislocation, I agree with radiology read           Final Clinical Impression(s) / ED Diagnoses Final diagnoses:  Contusion of right elbow, subsequent encounter    Rx / DC Orders ED Discharge Orders     None         Josem Kaufmann 02/08/23 2350    Eber Hong, MD 02/09/23 2059

## 2023-02-12 ENCOUNTER — Ambulatory Visit (HOSPITAL_COMMUNITY): Payer: 59 | Admitting: Psychiatry

## 2023-02-13 DIAGNOSIS — I10 Essential (primary) hypertension: Secondary | ICD-10-CM | POA: Diagnosis not present

## 2023-02-13 DIAGNOSIS — Z23 Encounter for immunization: Secondary | ICD-10-CM | POA: Diagnosis not present

## 2023-02-13 DIAGNOSIS — E039 Hypothyroidism, unspecified: Secondary | ICD-10-CM | POA: Diagnosis not present

## 2023-02-13 DIAGNOSIS — F411 Generalized anxiety disorder: Secondary | ICD-10-CM | POA: Diagnosis not present

## 2023-02-13 DIAGNOSIS — F5101 Primary insomnia: Secondary | ICD-10-CM | POA: Diagnosis not present

## 2023-02-13 DIAGNOSIS — F209 Schizophrenia, unspecified: Secondary | ICD-10-CM | POA: Diagnosis not present

## 2023-02-13 DIAGNOSIS — R7303 Prediabetes: Secondary | ICD-10-CM | POA: Diagnosis not present

## 2023-02-13 DIAGNOSIS — F329 Major depressive disorder, single episode, unspecified: Secondary | ICD-10-CM | POA: Diagnosis not present

## 2023-02-13 DIAGNOSIS — Z0000 Encounter for general adult medical examination without abnormal findings: Secondary | ICD-10-CM | POA: Diagnosis not present

## 2023-02-13 DIAGNOSIS — E782 Mixed hyperlipidemia: Secondary | ICD-10-CM | POA: Diagnosis not present

## 2023-02-20 DIAGNOSIS — M79674 Pain in right toe(s): Secondary | ICD-10-CM | POA: Diagnosis not present

## 2023-02-23 ENCOUNTER — Other Ambulatory Visit (HOSPITAL_COMMUNITY): Payer: Self-pay | Admitting: Psychiatry

## 2023-02-23 DIAGNOSIS — F411 Generalized anxiety disorder: Secondary | ICD-10-CM

## 2023-02-27 ENCOUNTER — Other Ambulatory Visit (HOSPITAL_COMMUNITY): Payer: Self-pay | Admitting: Psychiatry

## 2023-02-27 DIAGNOSIS — F411 Generalized anxiety disorder: Secondary | ICD-10-CM

## 2023-02-27 DIAGNOSIS — F251 Schizoaffective disorder, depressive type: Secondary | ICD-10-CM

## 2023-03-06 ENCOUNTER — Encounter (HOSPITAL_COMMUNITY): Payer: Self-pay | Admitting: Physical Therapy

## 2023-03-06 ENCOUNTER — Ambulatory Visit (HOSPITAL_COMMUNITY): Payer: 59 | Attending: Internal Medicine | Admitting: Physical Therapy

## 2023-03-06 ENCOUNTER — Other Ambulatory Visit: Payer: Self-pay

## 2023-03-06 DIAGNOSIS — M6281 Muscle weakness (generalized): Secondary | ICD-10-CM | POA: Insufficient documentation

## 2023-03-06 DIAGNOSIS — R2689 Other abnormalities of gait and mobility: Secondary | ICD-10-CM | POA: Insufficient documentation

## 2023-03-06 DIAGNOSIS — R29898 Other symptoms and signs involving the musculoskeletal system: Secondary | ICD-10-CM | POA: Diagnosis not present

## 2023-03-06 NOTE — Therapy (Signed)
OUTPATIENT PHYSICAL THERAPY LOWER EXTREMITY EVALUATION   Patient Name: [name] [name] MRN: [id number] DOB:Mar 05, 1961, 62 y.o., female Today's Date: 03/06/2023  END OF SESSION:  PT End of Session - 03/06/23 1029     Visit Number 1    Number of Visits 12    Date for PT Re-Evaluation 04/17/23    Authorization Type [employer] (no auth, no VL)    Progress Note Due on Visit 10    PT Start Time 1030    PT Stop Time 1110    PT Time Calculation (min) 40 min    Activity Tolerance Patient tolerated treatment well    Behavior During Therapy WFL for tasks assessed/performed             Past Medical History:  Diagnosis Date   BV (bacterial vaginosis) 01/28/2013   Closed nondisplaced fracture of head of radius with routine healing 03/30/2019   Encounter for gynecological examination with Papanicolaou smear of cervix 01/17/2021   Encounter for screening fecal occult blood testing 01/17/2021   Essential hypertension, benign    Hemorrhoids    Obsessive-compulsive disorders    Postmenopausal bleeding    Prolonged Q-T interval on ECG 06/02/2012   Rectal bleeding 06/21/2015   Schizoaffective disorder (HCC)    Schizophrenia (HCC) 01/12/2021   Syncope 06/02/2012   Tubular adenoma    Past Surgical History:  Procedure Laterality Date   BUNIONECTOMY     COLONOSCOPY N/A 07/05/2015   Dr.Rourk- internal hemorrhoids, redundant colon, colionic dierticulosis, colonic polyp. bx= tubular adenoma. next tcs 06/2020.    COLONOSCOPY WITH PROPOFOL N/A 10/12/2020   one 3 mm polyp in the cecum (tubular adenoma), sigmoid and descending colon diverticulosis.    HEMORRHOID BANDING  2017   Dr.Rourk   POLYPECTOMY  10/12/2020   Procedure: POLYPECTOMY INTESTINAL;  Surgeon: Corbin Ade, MD;  Location: [facility];  Service: Endoscopy;;  cecal colon polyp;    Patient Active Problem List   Diagnosis Date Noted   Falls 01/28/2023   Frequent stools 11/28/2022   High risk medication monitoring: divalproex  05/30/2022   Long term current use of antipsychotic medication 05/30/2022   Tremor 05/30/2022   Memory difficulties 05/30/2022   Internal hemorrhoids 07/25/2021   Prediabetes 01/13/2021   Morbid obesity (HCC) 01/13/2021   Primary insomnia 01/12/2021   Generalized anxiety disorder 01/12/2021   Mixed hyperlipidemia 01/07/2021   Hypothyroidism 01/07/2021   Schizoaffective disorder, depressive type (HCC) 05/08/2017   History of colonic polyps    Diverticulosis of colon without hemorrhage    Hemorrhoid    Family hx of colon cancer 06/21/2015   Postmenopausal bleeding 03/18/2013   BV (bacterial vaginosis) 01/28/2013   Murmur, cardiac 06/02/2012   Essential hypertension, benign 06/02/2012    PCP: Benita Stabile MD  REFERRING PROVIDER: Benita Stabile, MD  REFERRING DIAG: R26.89 (ICD-10-CM) - Other abnormalities of gait and mobility  THERAPY DIAG:  Other abnormalities of gait and mobility  Muscle weakness (generalized)  Other symptoms and signs involving the musculoskeletal system  Rationale for Evaluation and Treatment: Rehabilitation  ONSET DATE: 1 year  SUBJECTIVE:   SUBJECTIVE STATEMENT: Patient states she has had falls and some balance issues. She falls at work, at stores, and at home. She fell and had to get stitches. Some times legs feel like they want to give away.   PERTINENT HISTORY: HTN, Schizophrenia, Falls PAIN:  Are you having pain? No  PRECAUTIONS: Fall  WEIGHT BEARING RESTRICTIONS: No  FALLS:  Has patient fallen  in last 6 months? Yes. Number of falls 3-4  OCCUPATION: works at [employer]   PLOF: Independent  PATIENT GOALS: see what her strengths are   OBJECTIVE:    COGNITION: Overall cognitive status: Within functional limits for tasks assessed     SENSATION: WFL  POSTURE: rounded shoulders and forward head   LOWER EXTREMITY ROM:  Active ROM Right eval Left eval  Hip flexion    Hip extension    Hip abduction    Hip adduction    Hip  internal rotation    Hip external rotation    Knee flexion    Knee extension    Ankle dorsiflexion    Ankle plantarflexion    Ankle inversion    Ankle eversion     (Blank rows = not tested)  LOWER EXTREMITY MMT:  MMT Right eval Left eval  Hip flexion 4 4  Hip extension 3- 3-  Hip abduction 4- 3+  Hip adduction    Hip internal rotation    Hip external rotation    Knee flexion 4 4  Knee extension 4+ 4+  Ankle dorsiflexion 5 5  Ankle plantarflexion    Ankle inversion    Ankle eversion     (Blank rows = not tested)    FUNCTIONAL TESTS:  5 times sit to stand: 18.81 seconds without UE support 2 minute walk test: 310 feet Dynamic Gait Index: 16/24   GAIT: Distance walked: 310 feet Assistive device utilized: None Level of assistance: Complete Independence Comments: , antalgic on R but no c/o pain, decreased R hip extension and R ankle DF with R LE slightly Externally rotated  DGI 1. Gait level surface (2) Mild Impairment: Walks 20', uses assistive devices, slower speed, mild gait deviations. 2. Change in gait speed (2) Mild Impairment: Is able to change speed but demonstrates mild gait deviations, or not gait deviations but unable to achieve a significant change in velocity, or uses an assistive device. 3. Gait with horizontal head turns (2) Mild Impairment: Performs head turns smoothly with slight change in gait velocity, i.e., minor disruption to smooth gait path or uses walking aid. 4. Gait with vertical head turns (2) Mild Impairment: Performs head turns smoothly with slight change in gait velocity, i.e., minor disruption to smooth gait path or uses walking aid. 5. Gait and pivot turn (2) Mild Impairment: Pivot turns safely in > 3 seconds and stops with no loss of balance. 6. Step over obstacle (2) Mild Impairment: Is able to step over box, but must slow down and adjust steps to clear box safely. 7. Step around obstacles (2) Mild Impairment: Is able to step  around both cones, but must slow down and adjust steps to clear cones. 8. Stairs (2) Mild Impairment: Alternating feet, must use rail.  TOTAL SCORE: 16 / 24   TODAY'S TREATMENT:  DATE:  03/06/23 LAQ 10 x 5 second holds  Bridge 1x 10  Standing hip abduction 1 x 10 bilateral    PATIENT EDUCATION:  Education details: Patient educated on exam findings, POC, scope of PT, HEP. Person educated: Patient Education method: Explanation, Demonstration, and Handouts Education comprehension: verbalized understanding, returned demonstration, verbal cues required, and tactile cues required  HOME EXERCISE PROGRAM: Access Code: [email or web address] DRFE URL: [email or web address]  Date: 03/06/2023 - Seated Long Arc Quad  - 2-3 x daily - 7 x weekly - 2 sets - 10 reps - 5 second hold - Supine Bridge  - 2-3 x daily - 7 x weekly - 2 sets - 10 reps - Standing Hip Abduction with Counter Support  - 2-3 x daily - 7 x weekly - 2 sets - 10 reps  ASSESSMENT:  CLINICAL IMPRESSION: Patient a 62 y.o. y.o. female who was seen today for physical therapy evaluation and treatment for gait and mobility. Patient presents with deficits in bilateral LE strength, ROM, endurance, activity tolerance, gait, balance, and functional mobility with ADL. Patient is having to modify and restrict ADL as indicated by DGI score as well as subjective information and objective measures which is affecting overall participation. Patient will benefit from skilled physical therapy in order to improve function and reduce impairment.  OBJECTIVE IMPAIRMENTS: Abnormal gait, decreased activity tolerance, decreased balance, decreased endurance, decreased mobility, difficulty walking, decreased strength, impaired flexibility, and improper body mechanics.   ACTIVITY LIMITATIONS: carrying, lifting, bending, standing, squatting,  stairs, transfers, locomotion level, and caring for others  PARTICIPATION LIMITATIONS: meal prep, cleaning, laundry, shopping, community activity, occupation, and yard work  PERSONAL FACTORS: Fitness, Past/current experiences, Time since onset of injury/illness/exacerbation, and 3+ comorbidities: HTN, Schizophrenia, Falls  are also affecting patient's functional outcome.   REHAB POTENTIAL: Good  CLINICAL DECISION MAKING: Stable/uncomplicated  EVALUATION COMPLEXITY: Low   GOALS: Goals reviewed with patient? Yes  SHORT TERM GOALS: Target date: 03/27/2023    Patient will be independent with HEP in order to improve functional outcomes. Baseline: Goal status: INITIAL  2.  Patient will report at least 25% improvement in symptoms for improved quality of life. Baseline: Goal status: INITIAL   LONG TERM GOALS: Target date: 04/17/2023    Patient will report at least 75% improvement in symptoms for improved quality of life. Baseline:  Goal status: INITIAL  2.  Patient will improve DGI score by at least 5 points in order to indicate improved tolerance to activity. Baseline: 16/24 Goal status: INITIAL  3.    Patient will demonstrate grade of 4+/5 MMT grade in all tested musculature as evidence of improved strength to assist with stair ambulation and gait.   Baseline: see above Goal status: INITIAL  4.  Patient will be able to ambulate at least 375 feet in in order to demonstrate improved tolerance to activity. Baseline: 310 feet Goal status: INITIAL  5.  Patient will be able to complete 5x STS in under 11.4 seconds in order to reduce the risk of falls. Baseline: 18.81 seconds Goal status: INITIAL    PLAN:  PT FREQUENCY: 2x/week  PT DURATION: 6 weeks  PLANNED INTERVENTIONS: Therapeutic exercises, Therapeutic activity, Neuromuscular re-education, Balance training, Gait training, Patient/Family education, Joint manipulation, Joint mobilization, Stair training,  Orthotic/Fit training, DME instructions, Aquatic Therapy, Dry Needling, Electrical stimulation, Spinal manipulation, Spinal mobilization, Cryotherapy, Moist heat, Compression bandaging, scar mobilization, Splintting, Taping, Traction, Ultrasound, Ionotophoresis 4mg /ml Dexamethasone, and Manual therapy  PLAN FOR NEXT SESSION: bilateral hip and LE strength, hip extension  mobility, functional strength, gait and balance training   Wyman Songster, PT 03/06/2023, 11:15 AM

## 2023-03-11 NOTE — Therapy (Signed)
OUTPATIENT PHYSICAL THERAPY LOWER EXTREMITY EVALUATION   Patient Name: [name] [name] MRN: [id number] DOB:19-Jan-1961, 62 y.o., female Today's Date: 03/12/2023  END OF SESSION:  PT End of Session - 03/12/23 0853     Visit Number 2    Number of Visits 12    Date for PT Re-Evaluation 04/17/23    Authorization Type [employer]et[employer] (no auth, no VL)    Authorization - Visit Number 1    Progress Note Due on Visit 10    PT Start Time 0815    PT Stop Time 0854    PT Time Calculation (min) 39 min    Equipment Utilized During Treatment Gait belt    Activity Tolerance Patient tolerated treatment well    Behavior During Therapy WFL for tasks assessed/performed              Past Medical History:  Diagnosis Date   BV (bacterial vaginosis) 01/28/2013   Closed nondisplaced fracture of head of radius with routine healing 03/30/2019   Encounter for gynecological examination with Papanicolaou smear of cervix 01/17/2021   Encounter for screening fecal occult blood testing 01/17/2021   Essential hypertension, benign    Hemorrhoids    Obsessive-compulsive disorders    Postmenopausal bleeding    Prolonged Q-T interval on ECG 06/02/2012   Rectal bleeding 06/21/2015   Schizoaffective disorder (HCC)    Schizophrenia (HCC) 01/12/2021   Syncope 06/02/2012   Tubular adenoma    Past Surgical History:  Procedure Laterality Date   BUNIONECTOMY     COLONOSCOPY N/A 07/05/2015   Dr.Rourk- internal hemorrhoids, redundant colon, colionic dierticulosis, colonic polyp. bx= tubular adenoma. next tcs 06/2020.    COLONOSCOPY WITH PROPOFOL N/A 10/12/2020   one 3 mm polyp in the cecum (tubular adenoma), sigmoid and descending colon diverticulosis.    HEMORRHOID BANDING  2017   Dr.Rourk   POLYPECTOMY  10/12/2020   Procedure: POLYPECTOMY INTESTINAL;  Surgeon: Corbin Ade, MD;  Location: [facility];  Service: Endoscopy;;  cecal colon polyp;    Patient Active Problem List   Diagnosis Date Noted   Falls  01/28/2023   Frequent stools 11/28/2022   High risk medication monitoring: divalproex 05/30/2022   Long term current use of antipsychotic medication 05/30/2022   Tremor 05/30/2022   Memory difficulties 05/30/2022   Internal hemorrhoids 07/25/2021   Prediabetes 01/13/2021   Morbid obesity (HCC) 01/13/2021   Primary insomnia 01/12/2021   Generalized anxiety disorder 01/12/2021   Mixed hyperlipidemia 01/07/2021   Hypothyroidism 01/07/2021   Schizoaffective disorder, depressive type (HCC) 05/08/2017   History of colonic polyps    Diverticulosis of colon without hemorrhage    Hemorrhoid    Family hx of colon cancer 06/21/2015   Postmenopausal bleeding 03/18/2013   BV (bacterial vaginosis) 01/28/2013   Murmur, cardiac 06/02/2012   Essential hypertension, benign 06/02/2012    PCP: Benita Stabile MD  REFERRING PROVIDER: Benita Stabile, MD  REFERRING DIAG: R26.89 (ICD-10-CM) - Other abnormalities of gait and mobility  THERAPY DIAG:  Other abnormalities of gait and mobility  Muscle weakness (generalized)  Other symptoms and signs involving the musculoskeletal system  Rationale for Evaluation and Treatment: Rehabilitation  ONSET DATE: 1 year  SUBJECTIVE:   SUBJECTIVE STATEMENT: Pt reporting she had a fall this morning and hurt her right foot. She went into the kitchen because she thought something was burning and tripped in the dark. Only the lateral anterior aspect of R foot is hurting. Did not rate pain.   Eval:Patient  states she has had falls and some balance issues. She falls at work, at stores, and at home. She fell and had to get stitches. Some times legs feel like they want to give away.   PERTINENT HISTORY: HTN, Schizophrenia, Falls PAIN:  Are you having pain? No  PRECAUTIONS: Fall  WEIGHT BEARING RESTRICTIONS: No  FALLS:  Has patient fallen in last 6 months? Yes. Number of falls 3-4  OCCUPATION: works at [employer]   PLOF: Independent  PATIENT GOALS: see what  her strengths are   OBJECTIVE:    COGNITION: Overall cognitive status: Within functional limits for tasks assessed     SENSATION: WFL  POSTURE: rounded shoulders and forward head   LOWER EXTREMITY ROM:  Active ROM Right eval Left eval  Hip flexion    Hip extension    Hip abduction    Hip adduction    Hip internal rotation    Hip external rotation    Knee flexion    Knee extension    Ankle dorsiflexion    Ankle plantarflexion    Ankle inversion    Ankle eversion     (Blank rows = not tested)  LOWER EXTREMITY MMT:  MMT Right eval Left eval  Hip flexion 4 4  Hip extension 3- 3-  Hip abduction 4- 3+  Hip adduction    Hip internal rotation    Hip external rotation    Knee flexion 4 4  Knee extension 4+ 4+  Ankle dorsiflexion 5 5  Ankle plantarflexion    Ankle inversion    Ankle eversion     (Blank rows = not tested)    FUNCTIONAL TESTS:  5 times sit to stand: 18.81 seconds without UE support 2 minute walk test: 310 feet Dynamic Gait Index: 16/24   GAIT: Distance walked: 310 feet Assistive device utilized: None Level of assistance: Complete Independence Comments: , antalgic on R but no c/o pain, decreased R hip extension and R ankle DF with R LE slightly Externally rotated  DGI 1. Gait level surface (2) Mild Impairment: Walks 20', uses assistive devices, slower speed, mild gait deviations. 2. Change in gait speed (2) Mild Impairment: Is able to change speed but demonstrates mild gait deviations, or not gait deviations but unable to achieve a significant change in velocity, or uses an assistive device. 3. Gait with horizontal head turns (2) Mild Impairment: Performs head turns smoothly with slight change in gait velocity, i.e., minor disruption to smooth gait path or uses walking aid. 4. Gait with vertical head turns (2) Mild Impairment: Performs head turns smoothly with slight change in gait velocity, i.e., minor disruption to smooth gait path  or uses walking aid. 5. Gait and pivot turn (2) Mild Impairment: Pivot turns safely in > 3 seconds and stops with no loss of balance. 6. Step over obstacle (2) Mild Impairment: Is able to step over box, but must slow down and adjust steps to clear box safely. 7. Step around obstacles (2) Mild Impairment: Is able to step around both cones, but must slow down and adjust steps to clear cones. 8. Stairs (2) Mild Impairment: Alternating feet, must use rail.  TOTAL SCORE: 16 / 24   TODAY'S TREATMENT:  DATE:  03/12/2023  -Recumbent Bike x 5'- for gross LE blood flow; cues for pacing. 2-3/10 RPE -3x5 STS with blue foam pad w/ supervision- cues for anterior weight shift and no UE support. L lateral weight shift in standing -x10 2K gram yellow ball front squats w/  blue foam pad.  -60ft x 5 with RTB side stepping with handheld support along rail cues for proper squat and movement patterns -4in stair negotiation x 4 w/o  UE support.  W/ reciprocal ascending and stepto--> reciprocal descending with cues for proper speed and movement patterns. CGA with descending without UE support.  -7in stepups x 20 bilaterally cues for no UE support and pacing.  -   03/06/23 LAQ 10 x 5 second holds  Bridge 1x 10  Standing hip abduction 1 x 10 bilateral    PATIENT EDUCATION:  Education details: Patient educated on exam findings, POC, scope of PT, HEP. Person educated: Patient Education method: Explanation, Demonstration, and Handouts Education comprehension: verbalized understanding, returned demonstration, verbal cues required, and tactile cues required  HOME EXERCISE PROGRAM: Access Code: [email or web address] DRFE URL: [email or web address]  Date: 03/06/2023 - Seated Long Arc Quad  - 2-3 x daily - 7 x weekly - 2 sets - 10 reps - 5 second hold - Supine Bridge  - 2-3 x daily - 7 x  weekly - 2 sets - 10 reps - Standing Hip Abduction with Counter Support  - 2-3 x daily - 7 x weekly - 2 sets - 10 reps  Access Code: [email or web address] URL: [email or web address] Date: 03/12/2023 Prepared by: Starling Manns  Exercises - Side Stepping with Resistance at Ankles and Counter Support  - 1 x daily - 7 x weekly - 3 sets - 10 reps - Squat with Chair Touch  - 1 x daily - 7 x weekly - 3 sets - 10 reps ASSESSMENT:  CLINICAL IMPRESSION:  Pt tolerating session well, with focus on BLE functional strengthening without UE support for balancing. Fatiguing appropriately. Needing consistent cuing for proper movement patterns and CGA for unsteadiness. Pt did report falling earlier this morning. Educated on importance of awareness and taking increased time for fast movements early in morning.. Pt will continue to benefit from skilled physical therapy services to address functional deficits and improve overall safety with functional mobility.     OBJECTIVE IMPAIRMENTS: Abnormal gait, decreased activity tolerance, decreased balance, decreased endurance, decreased mobility, difficulty walking, decreased strength, impaired flexibility, and improper body mechanics.   ACTIVITY LIMITATIONS: carrying, lifting, bending, standing, squatting, stairs, transfers, locomotion level, and caring for others  PARTICIPATION LIMITATIONS: meal prep, cleaning, laundry, shopping, community activity, occupation, and yard work  PERSONAL FACTORS: Fitness, Past/current experiences, Time since onset of injury/illness/exacerbation, and 3+ comorbidities: HTN, Schizophrenia, Falls  are also affecting patient's functional outcome.   REHAB POTENTIAL: Good  CLINICAL DECISION MAKING: Stable/uncomplicated  EVALUATION COMPLEXITY: Low   GOALS: Goals reviewed with patient? Yes  SHORT TERM GOALS: Target date: 03/27/2023    Patient will be independent with HEP in order to improve functional outcomes. Baseline: Goal  status: INITIAL  2.  Patient will report at least 25% improvement in symptoms for improved quality of life. Baseline: Goal status: INITIAL   LONG TERM GOALS: Target date: 04/17/2023    Patient will report at least 75% improvement in symptoms for improved quality of life. Baseline:  Goal status: INITIAL  2.  Patient will improve DGI score by at least 5 points in order to indicate improved tolerance to activity. Baseline: 16/24 Goal status: INITIAL  3.    Patient will demonstrate grade of 4+/5 MMT grade in all tested musculature as evidence of improved strength to assist with stair ambulation and gait.   Baseline: see above Goal status: INITIAL  4.  Patient will be able to ambulate at least 375 feet in in order to demonstrate improved tolerance to activity. Baseline: 310 feet Goal status: INITIAL  5.  Patient will be able to complete 5x STS in under 11.4 seconds in order to reduce the risk of falls. Baseline: 18.81 seconds Goal status: INITIAL    PLAN:  PT FREQUENCY: 2x/week  PT DURATION: 6 weeks  PLANNED INTERVENTIONS: Therapeutic exercises, Therapeutic activity, Neuromuscular re-education, Balance training, Gait training, Patient/Family education, Joint manipulation, Joint mobilization, Stair training, Orthotic/Fit training, DME instructions, Aquatic Therapy, Dry Needling, Electrical stimulation, Spinal manipulation, Spinal mobilization, Cryotherapy, Moist heat, Compression bandaging, scar mobilization, Splintting, Taping, Traction, Ultrasound, Ionotophoresis 4mg /ml Dexamethasone, and Manual therapy  PLAN FOR NEXT SESSION: backwards walking, bilateral hip and LE strength, hip extension mobility, functional strength, gait and balance training  Nelida Meuse PT, DPT Physical Therapist with [facility] [phone] office Nelida Meuse, PT 03/12/2023, 8:54 AM

## 2023-03-12 ENCOUNTER — Encounter (HOSPITAL_COMMUNITY): Payer: Self-pay

## 2023-03-12 ENCOUNTER — Ambulatory Visit (HOSPITAL_COMMUNITY): Payer: 59

## 2023-03-12 DIAGNOSIS — R29898 Other symptoms and signs involving the musculoskeletal system: Secondary | ICD-10-CM | POA: Diagnosis not present

## 2023-03-12 DIAGNOSIS — M6281 Muscle weakness (generalized): Secondary | ICD-10-CM

## 2023-03-12 DIAGNOSIS — R2689 Other abnormalities of gait and mobility: Secondary | ICD-10-CM

## 2023-03-13 ENCOUNTER — Telehealth (HOSPITAL_COMMUNITY): Payer: 59 | Admitting: Psychiatry

## 2023-03-13 ENCOUNTER — Other Ambulatory Visit (HOSPITAL_COMMUNITY): Payer: Self-pay | Admitting: Internal Medicine

## 2023-03-13 DIAGNOSIS — Z1231 Encounter for screening mammogram for malignant neoplasm of breast: Secondary | ICD-10-CM

## 2023-03-19 ENCOUNTER — Ambulatory Visit (HOSPITAL_COMMUNITY): Payer: 59 | Admitting: Physical Therapy

## 2023-03-19 DIAGNOSIS — M6281 Muscle weakness (generalized): Secondary | ICD-10-CM

## 2023-03-19 DIAGNOSIS — R29898 Other symptoms and signs involving the musculoskeletal system: Secondary | ICD-10-CM | POA: Diagnosis not present

## 2023-03-19 DIAGNOSIS — R2689 Other abnormalities of gait and mobility: Secondary | ICD-10-CM

## 2023-03-19 NOTE — Therapy (Signed)
OUTPATIENT PHYSICAL THERAPY TREATMENT   Patient Name: [name] [name] MRN: [id number] DOB:12-Feb-1961, 62 y.o., female Today's Date: 03/19/2023  END OF SESSION:  PT End of Session - 03/19/23 0746     Visit Number 3    Number of Visits 12    Date for PT Re-Evaluation 04/17/23    Authorization Type [employer] (no auth, no VL)    Progress Note Due on Visit 10    PT Start Time 0745    PT Stop Time 0815    PT Time Calculation (min) 30 min    Equipment Utilized During Treatment Gait belt    Activity Tolerance Patient tolerated treatment well    Behavior During Therapy WFL for tasks assessed/performed               Past Medical History:  Diagnosis Date   BV (bacterial vaginosis) 01/28/2013   Closed nondisplaced fracture of head of radius with routine healing 03/30/2019   Encounter for gynecological examination with Papanicolaou smear of cervix 01/17/2021   Encounter for screening fecal occult blood testing 01/17/2021   Essential hypertension, benign    Hemorrhoids    Obsessive-compulsive disorders    Postmenopausal bleeding    Prolonged Q-T interval on ECG 06/02/2012   Rectal bleeding 06/21/2015   Schizoaffective disorder (HCC)    Schizophrenia (HCC) 01/12/2021   Syncope 06/02/2012   Tubular adenoma    Past Surgical History:  Procedure Laterality Date   BUNIONECTOMY     COLONOSCOPY N/A 07/05/2015   Dr.Rourk- internal hemorrhoids, redundant colon, colionic dierticulosis, colonic polyp. bx= tubular adenoma. next tcs 06/2020.    COLONOSCOPY WITH PROPOFOL N/A 10/12/2020   one 3 mm polyp in the cecum (tubular adenoma), sigmoid and descending colon diverticulosis.    HEMORRHOID BANDING  2017   Dr.Rourk   POLYPECTOMY  10/12/2020   Procedure: POLYPECTOMY INTESTINAL;  Surgeon: Corbin Ade, MD;  Location: [facility];  Service: Endoscopy;;  cecal colon polyp;    Patient Active Problem List   Diagnosis Date Noted   Falls 01/28/2023   Frequent stools 11/28/2022   High risk  medication monitoring: divalproex 05/30/2022   Long term current use of antipsychotic medication 05/30/2022   Tremor 05/30/2022   Memory difficulties 05/30/2022   Internal hemorrhoids 07/25/2021   Prediabetes 01/13/2021   Morbid obesity (HCC) 01/13/2021   Primary insomnia 01/12/2021   Generalized anxiety disorder 01/12/2021   Mixed hyperlipidemia 01/07/2021   Hypothyroidism 01/07/2021   Schizoaffective disorder, depressive type (HCC) 05/08/2017   History of colonic polyps    Diverticulosis of colon without hemorrhage    Hemorrhoid    Family hx of colon cancer 06/21/2015   Postmenopausal bleeding 03/18/2013   BV (bacterial vaginosis) 01/28/2013   Murmur, cardiac 06/02/2012   Essential hypertension, benign 06/02/2012    PCP: Benita Stabile MD  REFERRING PROVIDER: Benita Stabile, MD  REFERRING DIAG: R26.89 (ICD-10-CM) - Other abnormalities of gait and mobility  THERAPY DIAG:  Other abnormalities of gait and mobility  Muscle weakness (generalized)  Other symptoms and signs involving the musculoskeletal system  Rationale for Evaluation and Treatment: Rehabilitation  ONSET DATE: 1 year  SUBJECTIVE:   SUBJECTIVE STATEMENT: Pt reports no new symptoms today.  Eval:Patient states she has had falls and some balance issues. She falls at work, at stores, and at home. She fell and had to get stitches. Some times legs feel like they want to give away.   PERTINENT HISTORY: HTN, Schizophrenia, Falls PAIN:  Are you having  pain? No  PRECAUTIONS: Fall  WEIGHT BEARING RESTRICTIONS: No  FALLS:  Has patient fallen in last 6 months? Yes. Number of falls 3-4  OCCUPATION: works at [employer]   PLOF: Independent  PATIENT GOALS: see what her strengths are   OBJECTIVE:    COGNITION: Overall cognitive status: Within functional limits for tasks assessed     SENSATION: WFL  POSTURE: rounded shoulders and forward head   LOWER EXTREMITY ROM:  Active ROM Right eval Left eval   Hip flexion    Hip extension    Hip abduction    Hip adduction    Hip internal rotation    Hip external rotation    Knee flexion    Knee extension    Ankle dorsiflexion    Ankle plantarflexion    Ankle inversion    Ankle eversion     (Blank rows = not tested)  LOWER EXTREMITY MMT:  MMT Right eval Left eval  Hip flexion 4 4  Hip extension 3- 3-  Hip abduction 4- 3+  Hip adduction    Hip internal rotation    Hip external rotation    Knee flexion 4 4  Knee extension 4+ 4+  Ankle dorsiflexion 5 5  Ankle plantarflexion    Ankle inversion    Ankle eversion     (Blank rows = not tested)    FUNCTIONAL TESTS:  5 times sit to stand: 18.81 seconds without UE support 2 minute walk test: 310 feet Dynamic Gait Index: 16/24   GAIT: Distance walked: 310 feet Assistive device utilized: None Level of assistance: Complete Independence Comments: , antalgic on R but no c/o pain, decreased R hip extension and R ankle DF with R LE slightly Externally rotated  DGI 1. Gait level surface (2) Mild Impairment: Walks 20', uses assistive devices, slower speed, mild gait deviations. 2. Change in gait speed (2) Mild Impairment: Is able to change speed but demonstrates mild gait deviations, or not gait deviations but unable to achieve a significant change in velocity, or uses an assistive device. 3. Gait with horizontal head turns (2) Mild Impairment: Performs head turns smoothly with slight change in gait velocity, i.e., minor disruption to smooth gait path or uses walking aid. 4. Gait with vertical head turns (2) Mild Impairment: Performs head turns smoothly with slight change in gait velocity, i.e., minor disruption to smooth gait path or uses walking aid. 5. Gait and pivot turn (2) Mild Impairment: Pivot turns safely in > 3 seconds and stops with no loss of balance. 6. Step over obstacle (2) Mild Impairment: Is able to step over box, but must slow down and adjust steps to clear box  safely. 7. Step around obstacles (2) Mild Impairment: Is able to step around both cones, but must slow down and adjust steps to clear cones. 8. Stairs (2) Mild Impairment: Alternating feet, must use rail.  TOTAL SCORE: 16 / 24   TODAY'S TREATMENT:  DATE:  03/19/23 Therapeutic exercise x33min -NuStep seat #7 , handles #8, L3 for warm up -Sidestepping vs G theraband in // bars x3laps  NMR x - S2S vs Yellow Medicine Ball 2x10 in //bars  - Step-to on L2 step with Isometric holds in // bars, 3" holds     03/12/2023  -Recumbent Bike x 5'- for gross LE blood flow; cues for pacing. 2-3/10 RPE -3x5 STS with blue foam pad w/ supervision- cues for anterior weight shift and no UE support. L lateral weight shift in standing -x10 2K gram yellow ball front squats w/  blue foam pad.  -68ft x 5 with RTB side stepping with handheld support along rail cues for proper squat and movement patterns -4in stair negotiation x 4 w/o  UE support.  W/ reciprocal ascending and stepto--> reciprocal descending with cues for proper speed and movement patterns. CGA with descending without UE support.  -7in stepups x 20 bilaterally cues for no UE support and pacing.  -   03/06/23 LAQ 10 x 5 second holds  Bridge 1x 10  Standing hip abduction 1 x 10 bilateral    PATIENT EDUCATION:  Education details: Patient educated on exam findings, POC, scope of PT, HEP. Person educated: Patient Education method: Explanation, Demonstration, and Handouts Education comprehension: verbalized understanding, returned demonstration, verbal cues required, and tactile cues required  HOME EXERCISE PROGRAM: Access Code: [email or web address] DRFE URL: [email or web address]  Date: 03/06/2023 - Seated Long Arc Quad  - 2-3 x daily - 7 x weekly - 2 sets - 10 reps - 5 second hold - Supine Bridge  - 2-3 x  daily - 7 x weekly - 2 sets - 10 reps - Standing Hip Abduction with Counter Support  - 2-3 x daily - 7 x weekly - 2 sets - 10 reps  Access Code: [email or web address] URL: [email or web address] Date: 03/12/2023 Prepared by: Starling Manns  Exercises - Side Stepping with Resistance at Ankles and Counter Support  - 1 x daily - 7 x weekly - 3 sets - 10 reps - Squat with Chair Touch  - 1 x daily - 7 x weekly - 3 sets - 10 reps  ASSESSMENT: CLINICAL IMPRESSION:  Pt tolerating session well, with focus on BLE functional strengthening. Pt will continue to benefit from skilled physical therapy services to address functional deficits and improve overall safety with functional mobility.     OBJECTIVE IMPAIRMENTS: Abnormal gait, decreased activity tolerance, decreased balance, decreased endurance, decreased mobility, difficulty walking, decreased strength, impaired flexibility, and improper body mechanics.   ACTIVITY LIMITATIONS: carrying, lifting, bending, standing, squatting, stairs, transfers, locomotion level, and caring for others  PARTICIPATION LIMITATIONS: meal prep, cleaning, laundry, shopping, community activity, occupation, and yard work  PERSONAL FACTORS: Fitness, Past/current experiences, Time since onset of injury/illness/exacerbation, and 3+ comorbidities: HTN, Schizophrenia, Falls  are also affecting patient's functional outcome.   REHAB POTENTIAL: Good  CLINICAL DECISION MAKING: Stable/uncomplicated  EVALUATION COMPLEXITY: Low   GOALS: Goals reviewed with patient? Yes  SHORT TERM GOALS: Target date: 03/27/2023    Patient will be independent with HEP in order to improve functional outcomes. Baseline: Goal status: INITIAL  2.  Patient will report at least 25% improvement in symptoms for improved quality of life. Baseline: Goal status: INITIAL   LONG TERM GOALS: Target date: 04/17/2023    Patient will report at least 75% improvement in symptoms for improved quality  of life. Baseline:  Goal status: INITIAL  2.  Patient will improve DGI score by at least 5  points in order to indicate improved tolerance to activity. Baseline: 16/24 Goal status: INITIAL  3.    Patient will demonstrate grade of 4+/5 MMT grade in all tested musculature as evidence of improved strength to assist with stair ambulation and gait.   Baseline: see above Goal status: INITIAL  4.  Patient will be able to ambulate at least 375 feet in in order to demonstrate improved tolerance to activity. Baseline: 310 feet Goal status: INITIAL  5.  Patient will be able to complete 5x STS in under 11.4 seconds in order to reduce the risk of falls. Baseline: 18.81 seconds Goal status: INITIAL    PLAN:  PT FREQUENCY: 2x/week  PT DURATION: 6 weeks  PLANNED INTERVENTIONS: Therapeutic exercises, Therapeutic activity, Neuromuscular re-education, Balance training, Gait training, Patient/Family education, Joint manipulation, Joint mobilization, Stair training, Orthotic/Fit training, DME instructions, Aquatic Therapy, Dry Needling, Electrical stimulation, Spinal manipulation, Spinal mobilization, Cryotherapy, Moist heat, Compression bandaging, scar mobilization, Splintting, Taping, Traction, Ultrasound, Ionotophoresis 4mg /ml Dexamethasone, and Manual therapy  PLAN FOR NEXT SESSION: backwards walking, bilateral hip and LE strength, hip extension mobility, functional strength, gait and balance training   Seymour Bars, PT 03/19/2023, 7:48 AM

## 2023-03-19 NOTE — Therapy (Signed)
OUTPATIENT PHYSICAL THERAPY TREATMENT   Patient Name: [name] [name] MRN: [id number] DOB:August 28, 1960, 62 y.o., female Today's Date: 03/20/2023  END OF SESSION:  PT End of Session - 03/20/23 1441     Visit Number 4    Number of Visits 12    Date for PT Re-Evaluation 04/17/23    Authorization Type Aet[employer] (no auth, no VL)    Authorization - Visit Number 3    Progress Note Due on Visit 10    PT Start Time 1436    PT Stop Time 1515    PT Time Calculation (min) 39 min    Equipment Utilized During Treatment Gait belt    Activity Tolerance Patient tolerated treatment well    Behavior During Therapy WFL for tasks assessed/performed                Past Medical History:  Diagnosis Date   BV (bacterial vaginosis) 01/28/2013   Closed nondisplaced fracture of head of radius with routine healing 03/30/2019   Encounter for gynecological examination with Papanicolaou smear of cervix 01/17/2021   Encounter for screening fecal occult blood testing 01/17/2021   Essential hypertension, benign    Hemorrhoids    Obsessive-compulsive disorders    Postmenopausal bleeding    Prolonged Q-T interval on ECG 06/02/2012   Rectal bleeding 06/21/2015   Schizoaffective disorder (HCC)    Schizophrenia (HCC) 01/12/2021   Syncope 06/02/2012   Tubular adenoma    Past Surgical History:  Procedure Laterality Date   BUNIONECTOMY     COLONOSCOPY N/A 07/05/2015   Dr.Rourk- internal hemorrhoids, redundant colon, colionic dierticulosis, colonic polyp. bx= tubular adenoma. next tcs 06/2020.    COLONOSCOPY WITH PROPOFOL N/A 10/12/2020   one 3 mm polyp in the cecum (tubular adenoma), sigmoid and descending colon diverticulosis.    HEMORRHOID BANDING  2017   Dr.Rourk   POLYPECTOMY  10/12/2020   Procedure: POLYPECTOMY INTESTINAL;  Surgeon: Corbin Ade, MD;  Location: [facility];  Service: Endoscopy;;  cecal colon polyp;    Patient Active Problem List   Diagnosis Date Noted   Falls 01/28/2023    Frequent stools 11/28/2022   High risk medication monitoring: divalproex 05/30/2022   Long term current use of antipsychotic medication 05/30/2022   Tremor 05/30/2022   Memory difficulties 05/30/2022   Internal hemorrhoids 07/25/2021   Prediabetes 01/13/2021   Morbid obesity (HCC) 01/13/2021   Primary insomnia 01/12/2021   Generalized anxiety disorder 01/12/2021   Mixed hyperlipidemia 01/07/2021   Hypothyroidism 01/07/2021   Schizoaffective disorder, depressive type (HCC) 05/08/2017   History of colonic polyps    Diverticulosis of colon without hemorrhage    Hemorrhoid    Family hx of colon cancer 06/21/2015   Postmenopausal bleeding 03/18/2013   BV (bacterial vaginosis) 01/28/2013   Murmur, cardiac 06/02/2012   Essential hypertension, benign 06/02/2012    PCP: Benita Stabile MD  REFERRING PROVIDER: Benita Stabile, MD  REFERRING DIAG: R26.89 (ICD-10-CM) - Other abnormalities of gait and mobility  THERAPY DIAG:  Other abnormalities of gait and mobility  Muscle weakness (generalized)  Other symptoms and signs involving the musculoskeletal system  Rationale for Evaluation and Treatment: Rehabilitation  ONSET DATE: 1 year  SUBJECTIVE:   SUBJECTIVE STATEMENT: No major symptoms. Pt reports going out with church friends yesterday.  Eval:Patient states she has had falls and some balance issues. She falls at work, at stores, and at home. She fell and had to get stitches. Some times legs feel like they want to  give away.   PERTINENT HISTORY: HTN, Schizophrenia, Falls PAIN:  Are you having pain? No  PRECAUTIONS: Fall  WEIGHT BEARING RESTRICTIONS: No  FALLS:  Has patient fallen in last 6 months? Yes. Number of falls 3-4  OCCUPATION: works at [employer]   PLOF: Independent  PATIENT GOALS: see what her strengths are   OBJECTIVE:    COGNITION: Overall cognitive status: Within functional limits for tasks assessed     SENSATION: WFL  POSTURE: rounded shoulders and  forward head   LOWER EXTREMITY ROM:  Active ROM Right eval Left eval  Hip flexion    Hip extension    Hip abduction    Hip adduction    Hip internal rotation    Hip external rotation    Knee flexion    Knee extension    Ankle dorsiflexion    Ankle plantarflexion    Ankle inversion    Ankle eversion     (Blank rows = not tested)  LOWER EXTREMITY MMT:  MMT Right eval Left eval  Hip flexion 4 4  Hip extension 3- 3-  Hip abduction 4- 3+  Hip adduction    Hip internal rotation    Hip external rotation    Knee flexion 4 4  Knee extension 4+ 4+  Ankle dorsiflexion 5 5  Ankle plantarflexion    Ankle inversion    Ankle eversion     (Blank rows = not tested)    FUNCTIONAL TESTS:  5 times sit to stand: 18.81 seconds without UE support 2 minute walk test: 310 feet Dynamic Gait Index: 16/24   GAIT: Distance walked: 310 feet Assistive device utilized: None Level of assistance: Complete Independence Comments: , antalgic on R but no c/o pain, decreased R hip extension and R ankle DF with R LE slightly Externally rotated  DGI 1. Gait level surface (2) Mild Impairment: Walks 20', uses assistive devices, slower speed, mild gait deviations. 2. Change in gait speed (2) Mild Impairment: Is able to change speed but demonstrates mild gait deviations, or not gait deviations but unable to achieve a significant change in velocity, or uses an assistive device. 3. Gait with horizontal head turns (2) Mild Impairment: Performs head turns smoothly with slight change in gait velocity, i.e., minor disruption to smooth gait path or uses walking aid. 4. Gait with vertical head turns (2) Mild Impairment: Performs head turns smoothly with slight change in gait velocity, i.e., minor disruption to smooth gait path or uses walking aid. 5. Gait and pivot turn (2) Mild Impairment: Pivot turns safely in > 3 seconds and stops with no loss of balance. 6. Step over obstacle (2) Mild  Impairment: Is able to step over box, but must slow down and adjust steps to clear box safely. 7. Step around obstacles (2) Mild Impairment: Is able to step around both cones, but must slow down and adjust steps to clear cones. 8. Stairs (2) Mild Impairment: Alternating feet, must use rail.  TOTAL SCORE: 16 / 24   TODAY'S TREATMENT:  DATE: 03/20/2023  -Recumbent Bike x 5'- for gross LE blood flow; cues for pacing. 2-3/10 RPE -Squats w/ blue foam pad 1 x 8 w/ blue weighted theraball, 2x8 w/ orange weighted ball; CGA.  -Tandem stance on blue foam pad 3 x 30 seconds bilaterally; supervision for RLE and CGA for LLE.  -Modified single leg balance bilateral with contralateral toe down and BUE support 2 x 30 seconds.     03/19/23 Therapeutic exercise x58min -NuStep seat #7 , handles #8, L3 for warm up -Sidestepping vs G theraband in // bars x3laps  NMR x - S2S vs Yellow Medicine Ball 2x10 in //bars  - Step-to on L2 step with Isometric holds in // bars, 3" holds     03/12/2023  -Recumbent Bike x 5'- for gross LE blood flow; cues for pacing. 2-3/10 RPE -3x5 STS with blue foam pad w/ supervision- cues for anterior weight shift and no UE support. L lateral weight shift in standing -x10 2K gram yellow ball front squats w/  blue foam pad.  -69ft x 5 with RTB side stepping with handheld support along rail cues for proper squat and movement patterns -4in stair negotiation x 4 w/o  UE support.  W/ reciprocal ascending and stepto--> reciprocal descending with cues for proper speed and movement patterns. CGA with descending without UE support.  -7in stepups x 20 bilaterally cues for no UE support and pacing.  -   03/06/23 LAQ 10 x 5 second holds  Bridge 1x 10  Standing hip abduction 1 x 10 bilateral    PATIENT EDUCATION:  Education details: Patient educated on  exam findings, POC, scope of PT, HEP. Person educated: Patient Education method: Explanation, Demonstration, and Handouts Education comprehension: verbalized understanding, returned demonstration, verbal cues required, and tactile cues required  HOME EXERCISE PROGRAM: Access Code: [email or web address] DRFE URL: [email or web address]  Date: 03/06/2023 - Seated Long Arc Quad  - 2-3 x daily - 7 x weekly - 2 sets - 10 reps - 5 second hold - Supine Bridge  - 2-3 x daily - 7 x weekly - 2 sets - 10 reps - Standing Hip Abduction with Counter Support  - 2-3 x daily - 7 x weekly - 2 sets - 10 reps Access Code: [email or web address] URL: [email or web address] Date: 03/20/2023 Prepared by: Starling Manns  Exercises - Side Stepping with Resistance at Ankles and Counter Support  - 1 x daily - 7 x weekly - 3 sets - 10 reps - Squat with Chair Touch  - 1 x daily - 7 x weekly - 3 sets - 10 reps - Single Leg Stance with Support  - 1 x daily - 7 x weekly - 3 sets - 10 reps  ASSESSMENT: CLINICAL IMPRESSION: Pt tolerating session well. Introducing BLE strengthening and narrow BOS balancing. Shows greater ankle weakness on LLE vs RLE. Given modified SL balance for new HEP. Pt will continue to benefit from skilled physical therapy services to address functional deficits and improve overall safety with functional mobility.     OBJECTIVE IMPAIRMENTS: Abnormal gait, decreased activity tolerance, decreased balance, decreased endurance, decreased mobility, difficulty walking, decreased strength, impaired flexibility, and improper body mechanics.   ACTIVITY LIMITATIONS: carrying, lifting, bending, standing, squatting, stairs, transfers, locomotion level, and caring for others  PARTICIPATION LIMITATIONS: meal prep, cleaning, laundry, shopping, community activity, occupation, and yard work  PERSONAL FACTORS: Fitness, Past/current experiences, Time since onset of injury/illness/exacerbation, and 3+ comorbidities:  HTN, Schizophrenia, Falls  are also affecting patient's functional outcome.   REHAB  POTENTIAL: Good  CLINICAL DECISION MAKING: Stable/uncomplicated  EVALUATION COMPLEXITY: Low   GOALS: Goals reviewed with patient? Yes  SHORT TERM GOALS: Target date: 03/27/2023    Patient will be independent with HEP in order to improve functional outcomes. Baseline: Goal status: INITIAL  2.  Patient will report at least 25% improvement in symptoms for improved quality of life. Baseline: Goal status: INITIAL   LONG TERM GOALS: Target date: 04/17/2023    Patient will report at least 75% improvement in symptoms for improved quality of life. Baseline:  Goal status: INITIAL  2.  Patient will improve DGI score by at least 5 points in order to indicate improved tolerance to activity. Baseline: 16/24 Goal status: INITIAL  3.    Patient will demonstrate grade of 4+/5 MMT grade in all tested musculature as evidence of improved strength to assist with stair ambulation and gait.   Baseline: see above Goal status: INITIAL  4.  Patient will be able to ambulate at least 375 feet in in order to demonstrate improved tolerance to activity. Baseline: 310 feet Goal status: INITIAL  5.  Patient will be able to complete 5x STS in under 11.4 seconds in order to reduce the risk of falls. Baseline: 18.81 seconds Goal status: INITIAL    PLAN:  PT FREQUENCY: 2x/week  PT DURATION: 6 weeks  PLANNED INTERVENTIONS: Therapeutic exercises, Therapeutic activity, Neuromuscular re-education, Balance training, Gait training, Patient/Family education, Joint manipulation, Joint mobilization, Stair training, Orthotic/Fit training, DME instructions, Aquatic Therapy, Dry Needling, Electrical stimulation, Spinal manipulation, Spinal mobilization, Cryotherapy, Moist heat, Compression bandaging, scar mobilization, Splintting, Taping, Traction, Ultrasound, Ionotophoresis 4mg /ml Dexamethasone, and Manual therapy  PLAN  FOR NEXT SESSION: backwards walking, bilateral hip and LE strength, hip extension mobility, functional strength, gait and balance training  Nelida Meuse PT, DPT Physical Therapist with [facility] [phone] office  Nelida Meuse, PT 03/20/2023, 3:16 PM

## 2023-03-20 ENCOUNTER — Ambulatory Visit (HOSPITAL_COMMUNITY): Payer: 59 | Admitting: Psychiatry

## 2023-03-20 ENCOUNTER — Ambulatory Visit (HOSPITAL_COMMUNITY): Payer: 59

## 2023-03-20 DIAGNOSIS — R29898 Other symptoms and signs involving the musculoskeletal system: Secondary | ICD-10-CM

## 2023-03-20 DIAGNOSIS — F251 Schizoaffective disorder, depressive type: Secondary | ICD-10-CM

## 2023-03-20 DIAGNOSIS — M6281 Muscle weakness (generalized): Secondary | ICD-10-CM

## 2023-03-20 DIAGNOSIS — R2689 Other abnormalities of gait and mobility: Secondary | ICD-10-CM | POA: Diagnosis not present
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
## 2023-03-25 ENCOUNTER — Ambulatory Visit (HOSPITAL_COMMUNITY): Payer: 59 | Admitting: Physical Therapy

## 2023-03-25 DIAGNOSIS — R2689 Other abnormalities of gait and mobility: Secondary | ICD-10-CM

## 2023-03-25 DIAGNOSIS — M6281 Muscle weakness (generalized): Secondary | ICD-10-CM

## 2023-03-25 DIAGNOSIS — R29898 Other symptoms and signs involving the musculoskeletal system: Secondary | ICD-10-CM | POA: Diagnosis not present

## 2023-03-25 NOTE — Therapy (Signed)
OUTPATIENT PHYSICAL THERAPY TREATMENT   Patient Name: [name] [name] MRN: [id number] DOB:04-Jul-1961, 62 y.o., female Today's Date: 03/25/2023  END OF SESSION:  PT End of Session - 03/25/23 0816     Visit Number 5    Number of Visits 12    Date for PT Re-Evaluation 04/17/23    Authorization Type [employer] (no auth, no VL)    Progress Note Due on Visit 10    PT Start Time 0746    PT Stop Time 0814    PT Time Calculation (min) 28 min    Equipment Utilized During Treatment Gait belt    Activity Tolerance Patient tolerated treatment well    Behavior During Therapy WFL for tasks assessed/performed                 Past Medical History:  Diagnosis Date   BV (bacterial vaginosis) 01/28/2013   Closed nondisplaced fracture of head of radius with routine healing 03/30/2019   Encounter for gynecological examination with Papanicolaou smear of cervix 01/17/2021   Encounter for screening fecal occult blood testing 01/17/2021   Essential hypertension, benign    Hemorrhoids    Obsessive-compulsive disorders    Postmenopausal bleeding    Prolonged Q-T interval on ECG 06/02/2012   Rectal bleeding 06/21/2015   Schizoaffective disorder (HCC)    Schizophrenia (HCC) 01/12/2021   Syncope 06/02/2012   Tubular adenoma    Past Surgical History:  Procedure Laterality Date   BUNIONECTOMY     COLONOSCOPY N/A 07/05/2015   Dr.Rourk- internal hemorrhoids, redundant colon, colionic dierticulosis, colonic polyp. bx= tubular adenoma. next tcs 06/2020.    COLONOSCOPY WITH PROPOFOL N/A 10/12/2020   one 3 mm polyp in the cecum (tubular adenoma), sigmoid and descending colon diverticulosis.    HEMORRHOID BANDING  2017   Dr.Rourk   POLYPECTOMY  10/12/2020   Procedure: POLYPECTOMY INTESTINAL;  Surgeon: Corbin Ade, MD;  Location: [facility];  Service: Endoscopy;;  cecal colon polyp;    Patient Active Problem List   Diagnosis Date Noted   Falls 01/28/2023   Frequent stools 11/28/2022   High  risk medication monitoring: divalproex 05/30/2022   Long term current use of antipsychotic medication 05/30/2022   Tremor 05/30/2022   Memory difficulties 05/30/2022   Internal hemorrhoids 07/25/2021   Prediabetes 01/13/2021   Morbid obesity (HCC) 01/13/2021   Primary insomnia 01/12/2021   Generalized anxiety disorder 01/12/2021   Mixed hyperlipidemia 01/07/2021   Hypothyroidism 01/07/2021   Schizoaffective disorder, depressive type (HCC) 05/08/2017   History of colonic polyps    Diverticulosis of colon without hemorrhage    Hemorrhoid    Family hx of colon cancer 06/21/2015   Postmenopausal bleeding 03/18/2013   BV (bacterial vaginosis) 01/28/2013   Murmur, cardiac 06/02/2012   Essential hypertension, benign 06/02/2012    PCP: Benita Stabile MD  REFERRING PROVIDER: Benita Stabile, MD  REFERRING DIAG: R26.89 (ICD-10-CM) - Other abnormalities of gait and mobility  THERAPY DIAG:  Other abnormalities of gait and mobility  Other symptoms and signs involving the musculoskeletal system  Muscle weakness (generalized)  Rationale for Evaluation and Treatment: Rehabilitation  ONSET DATE: 1 year  SUBJECTIVE:   SUBJECTIVE STATEMENT: Pt states she is sleepy, challenging to get here this early.  Is working today after therapy.  No pain or issues currently.  Eval:Patient states she has had falls and some balance issues. She falls at work, at stores, and at home. She fell and had to get stitches. Some times legs  feel like they want to give away.   PERTINENT HISTORY: HTN, Schizophrenia, Falls PAIN:  Are you having pain? No  PRECAUTIONS: Fall  WEIGHT BEARING RESTRICTIONS: No  FALLS:  Has patient fallen in last 6 months? Yes. Number of falls 3-4  OCCUPATION: works at [employer]   PLOF: Independent  PATIENT GOALS: see what her strengths are   OBJECTIVE:    COGNITION: Overall cognitive status: Within functional limits for tasks assessed     SENSATION: WFL  POSTURE:  rounded shoulders and forward head   LOWER EXTREMITY ROM:  Active ROM Right eval Left eval  Hip flexion    Hip extension    Hip abduction    Hip adduction    Hip internal rotation    Hip external rotation    Knee flexion    Knee extension    Ankle dorsiflexion    Ankle plantarflexion    Ankle inversion    Ankle eversion     (Blank rows = not tested)  LOWER EXTREMITY MMT:  MMT Right eval Left eval  Hip flexion 4 4  Hip extension 3- 3-  Hip abduction 4- 3+  Hip adduction    Hip internal rotation    Hip external rotation    Knee flexion 4 4  Knee extension 4+ 4+  Ankle dorsiflexion 5 5  Ankle plantarflexion    Ankle inversion    Ankle eversion     (Blank rows = not tested)    FUNCTIONAL TESTS:  5 times sit to stand: 18.81 seconds without UE support 2 minute walk test: 310 feet Dynamic Gait Index: 16/24   GAIT: Distance walked: 310 feet Assistive device utilized: None Level of assistance: Complete Independence Comments: , antalgic on R but no c/o pain, decreased R hip extension and R ankle DF with R LE slightly Externally rotated  DGI 1. Gait level surface (2) Mild Impairment: Walks 20', uses assistive devices, slower speed, mild gait deviations. 2. Change in gait speed (2) Mild Impairment: Is able to change speed but demonstrates mild gait deviations, or not gait deviations but unable to achieve a significant change in velocity, or uses an assistive device. 3. Gait with horizontal head turns (2) Mild Impairment: Performs head turns smoothly with slight change in gait velocity, i.e., minor disruption to smooth gait path or uses walking aid. 4. Gait with vertical head turns (2) Mild Impairment: Performs head turns smoothly with slight change in gait velocity, i.e., minor disruption to smooth gait path or uses walking aid. 5. Gait and pivot turn (2) Mild Impairment: Pivot turns safely in > 3 seconds and stops with no loss of balance. 6. Step over  obstacle (2) Mild Impairment: Is able to step over box, but must slow down and adjust steps to clear box safely. 7. Step around obstacles (2) Mild Impairment: Is able to step around both cones, but must slow down and adjust steps to clear cones. 8. Stairs (2) Mild Impairment: Alternating feet, must use rail.  TOTAL SCORE: 16 / 24   TODAY'S TREATMENT:  DATE: 03/2023 Standing:  heelraises 20X for warmup  Squats on blue foam pad with blue weighted ball 2X10  Tandem stance with 1# bar UE lift 10X each LE leading  Side stepping with GTB in bars no UE 5RT  Hip abduction with GTB 2X10  Hip extension with GTB 2X10  Forward lunges onto 4" step no UE 10X each  Forward step ups 4" no UE 10X each   03/20/2023  -Recumbent Bike x 5'- for gross LE blood flow; cues for pacing. 2-3/10 RPE -Squats w/ blue foam pad 1 x 8 w/ blue weighted theraball, 2x8 w/ orange weighted ball; CGA.  -Tandem stance on blue foam pad 3 x 30 seconds bilaterally; supervision for RLE and CGA for LLE.  -Modified single leg balance bilateral with contralateral toe down and BUE support 2 x 30 seconds.     03/19/23 Therapeutic exercise x61min -NuStep seat #7 , handles #8, L3 for warm up -Sidestepping vs G theraband in // bars x3laps  NMR x - S2S vs Yellow Medicine Ball 2x10 in //bars  - Step-to on L2 step with Isometric holds in // bars, 3" holds     03/12/2023  -Recumbent Bike x 5'- for gross LE blood flow; cues for pacing. 2-3/10 RPE -3x5 STS with blue foam pad w/ supervision- cues for anterior weight shift and no UE support. L lateral weight shift in standing -x10 2K gram yellow ball front squats w/  blue foam pad.  -89ft x 5 with RTB side stepping with handheld support along rail cues for proper squat and movement patterns -4in stair negotiation x 4 w/o  UE support.  W/ reciprocal  ascending and stepto--> reciprocal descending with cues for proper speed and movement patterns. CGA with descending without UE support.  -7in stepups x 20 bilaterally cues for no UE support and pacing.  -   03/06/23 LAQ 10 x 5 second holds  Bridge 1x 10  Standing hip abduction 1 x 10 bilateral    PATIENT EDUCATION:  Education details: Patient educated on exam findings, POC, scope of PT, HEP. Person educated: Patient Education method: Explanation, Demonstration, and Handouts Education comprehension: verbalized understanding, returned demonstration, verbal cues required, and tactile cues required  HOME EXERCISE PROGRAM: Access Code: [email or web address] URL: [email or web address]  Date: 03/06/2023 - Seated Long Arc Quad  - 2-3 x daily - 7 x weekly - 2 sets - 10 reps - 5 second hold - Supine Bridge  - 2-3 x daily - 7 x weekly - 2 sets - 10 reps - Standing Hip Abduction with Counter Support  - 2-3 x daily - 7 x weekly - 2 sets - 10 reps Access Code: [email or web address] URL: [email or web address] Date: 03/20/2023 Prepared by: Starling Manns  Exercises - Side Stepping with Resistance at Ankles and Counter Support  - 1 x daily - 7 x weekly - 3 sets - 10 reps - Squat with Chair Touch  - 1 x daily - 7 x weekly - 3 sets - 10 reps - Single Leg Stance with Support  - 1 x daily - 7 x weekly - 3 sets - 10 reps  ASSESSMENT: CLINICAL IMPRESSION: Continued with focus on improving LE strength and stability.  Cues needed for proper form completing squats as tends to shift weight onto toes.  Pt with difficulty stabilizing body with standing therex.  Cues needed to keep body isolated from LE movements with noted challenge.  Attempted tandem using foam, however unable to maintain balance.  No updates given  for HEP this session.  Treatment limited this session due to patients late arrival. Pt will continue to benefit from skilled physical therapy services to address functional deficits and improve  overall safety with functional mobility.     OBJECTIVE IMPAIRMENTS: Abnormal gait, decreased activity tolerance, decreased balance, decreased endurance, decreased mobility, difficulty walking, decreased strength, impaired flexibility, and improper body mechanics.   ACTIVITY LIMITATIONS: carrying, lifting, bending, standing, squatting, stairs, transfers, locomotion level, and caring for others  PARTICIPATION LIMITATIONS: meal prep, cleaning, laundry, shopping, community activity, occupation, and yard work  PERSONAL FACTORS: Fitness, Past/current experiences, Time since onset of injury/illness/exacerbation, and 3+ comorbidities: HTN, Schizophrenia, Falls  are also affecting patient's functional outcome.   REHAB POTENTIAL: Good  CLINICAL DECISION MAKING: Stable/uncomplicated  EVALUATION COMPLEXITY: Low   GOALS: Goals reviewed with patient? Yes  SHORT TERM GOALS: Target date: 03/27/2023    Patient will be independent with HEP in order to improve functional outcomes. Baseline: Goal status: INITIAL  2.  Patient will report at least 25% improvement in symptoms for improved quality of life. Baseline: Goal status: INITIAL   LONG TERM GOALS: Target date: 04/17/2023    Patient will report at least 75% improvement in symptoms for improved quality of life. Baseline:  Goal status: INITIAL  2.  Patient will improve DGI score by at least 5 points in order to indicate improved tolerance to activity. Baseline: 16/24 Goal status: INITIAL  3.    Patient will demonstrate grade of 4+/5 MMT grade in all tested musculature as evidence of improved strength to assist with stair ambulation and gait.   Baseline: see above Goal status: INITIAL  4.  Patient will be able to ambulate at least 375 feet in in order to demonstrate improved tolerance to activity. Baseline: 310 feet Goal status: INITIAL  5.  Patient will be able to complete 5x STS in under 11.4 seconds in order to reduce the risk  of falls. Baseline: 18.81 seconds Goal status: INITIAL    PLAN:  PT FREQUENCY: 2x/week  PT DURATION: 6 weeks  PLANNED INTERVENTIONS: Therapeutic exercises, Therapeutic activity, Neuromuscular re-education, Balance training, Gait training, Patient/Family education, Joint manipulation, Joint mobilization, Stair training, Orthotic/Fit training, DME instructions, Aquatic Therapy, Dry Needling, Electrical stimulation, Spinal manipulation, Spinal mobilization, Cryotherapy, Moist heat, Compression bandaging, scar mobilization, Splintting, Taping, Traction, Ultrasound, Ionotophoresis 4mg /ml Dexamethasone, and Manual therapy  PLAN FOR NEXT SESSION: backwards walking, bilateral hip and LE strength, hip extension mobility, functional strength, gait and balance training   Emeline Gins B, PTA 03/25/2023, 8:17 AM

## 2023-03-27 ENCOUNTER — Telehealth (HOSPITAL_COMMUNITY): Payer: 59 | Admitting: Psychiatry

## 2023-03-27 ENCOUNTER — Encounter (HOSPITAL_COMMUNITY): Payer: Self-pay | Admitting: Psychiatry

## 2023-03-27 ENCOUNTER — Ambulatory Visit (HOSPITAL_COMMUNITY): Payer: 59 | Admitting: Physical Therapy

## 2023-03-27 DIAGNOSIS — R29898 Other symptoms and signs involving the musculoskeletal system: Secondary | ICD-10-CM | POA: Diagnosis not present

## 2023-03-27 DIAGNOSIS — F411 Generalized anxiety disorder: Secondary | ICD-10-CM

## 2023-03-27 DIAGNOSIS — Z5181 Encounter for therapeutic drug level monitoring: Secondary | ICD-10-CM | POA: Diagnosis not present

## 2023-03-27 DIAGNOSIS — F259 Schizoaffective disorder, unspecified: Secondary | ICD-10-CM | POA: Diagnosis not present

## 2023-03-27 DIAGNOSIS — R2689 Other abnormalities of gait and mobility: Secondary | ICD-10-CM

## 2023-03-27 DIAGNOSIS — F251 Schizoaffective disorder, depressive type: Secondary | ICD-10-CM

## 2023-03-27 DIAGNOSIS — Z79899 Other long term (current) drug therapy: Secondary | ICD-10-CM

## 2023-03-27 DIAGNOSIS — M6281 Muscle weakness (generalized): Secondary | ICD-10-CM

## 2023-03-27 NOTE — Patient Instructions (Signed)
We did not make any medication changes today.  We will continue to monitor the excessive sleepy feeling and if we need to make an adjustment to your ziprasidone (Geodon) we may do so in the future.  If possible, please have your PCP send over the results of your memory test for me to review.
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
## 2023-03-27 NOTE — Therapy (Signed)
OUTPATIENT PHYSICAL THERAPY TREATMENT   Patient Name: [name] [name] MRN: [id number] DOB:07-Aug-1960, 62 y.o., female Today's Date: 03/27/2023  END OF SESSION:  PT End of Session - 03/27/23 0737     Visit Number 6    Number of Visits 12    Date for PT Re-Evaluation 04/17/23    Authorization Type [employer] (no auth, no VL)    Progress Note Due on Visit 10    PT Start Time 0735    PT Stop Time 0815    PT Time Calculation (min) 40 min    Equipment Utilized During Treatment Gait belt    Activity Tolerance Patient tolerated treatment well    Behavior During Therapy WFL for tasks assessed/performed                 Past Medical History:  Diagnosis Date   BV (bacterial vaginosis) 01/28/2013   Closed nondisplaced fracture of head of radius with routine healing 03/30/2019   Encounter for gynecological examination with Papanicolaou smear of cervix 01/17/2021   Encounter for screening fecal occult blood testing 01/17/2021   Essential hypertension, benign    Hemorrhoids    Obsessive-compulsive disorders    Postmenopausal bleeding    Prolonged Q-T interval on ECG 06/02/2012   Rectal bleeding 06/21/2015   Schizoaffective disorder (HCC)    Schizophrenia (HCC) 01/12/2021   Syncope 06/02/2012   Tubular adenoma    Past Surgical History:  Procedure Laterality Date   BUNIONECTOMY     COLONOSCOPY N/A 07/05/2015   Dr.Rourk- internal hemorrhoids, redundant colon, colionic dierticulosis, colonic polyp. bx= tubular adenoma. next tcs 06/2020.    COLONOSCOPY WITH PROPOFOL N/A 10/12/2020   one 3 mm polyp in the cecum (tubular adenoma), sigmoid and descending colon diverticulosis.    HEMORRHOID BANDING  2017   Dr.Rourk   POLYPECTOMY  10/12/2020   Procedure: POLYPECTOMY INTESTINAL;  Surgeon: Corbin Ade, MD;  Location: [facility];  Service: Endoscopy;;  cecal colon polyp;    Patient Active Problem List   Diagnosis Date Noted   Falls 01/28/2023   Frequent stools 11/28/2022   High  risk medication monitoring: divalproex 05/30/2022   Long term current use of antipsychotic medication 05/30/2022   Tremor 05/30/2022   Memory difficulties 05/30/2022   Internal hemorrhoids 07/25/2021   Prediabetes 01/13/2021   Morbid obesity (HCC) 01/13/2021   Primary insomnia 01/12/2021   Generalized anxiety disorder 01/12/2021   Mixed hyperlipidemia 01/07/2021   Hypothyroidism 01/07/2021   Schizoaffective disorder, depressive type (HCC) 05/08/2017   History of colonic polyps    Diverticulosis of colon without hemorrhage    Hemorrhoid    Family hx of colon cancer 06/21/2015   Postmenopausal bleeding 03/18/2013   BV (bacterial vaginosis) 01/28/2013   Murmur, cardiac 06/02/2012   Essential hypertension, benign 06/02/2012    PCP: Benita Stabile MD  REFERRING PROVIDER: Benita Stabile, MD  REFERRING DIAG: R26.89 (ICD-10-CM) - Other abnormalities of gait and mobility  THERAPY DIAG:  Other abnormalities of gait and mobility  Other symptoms and signs involving the musculoskeletal system  Muscle weakness (generalized)  Rationale for Evaluation and Treatment: Rehabilitation  ONSET DATE: 1 year  SUBJECTIVE:   SUBJECTIVE STATEMENT: Pt states she is sleepy, challenging to get here this early.  Is working today after therapy.  No pain or issues currently.  Eval:Patient states she has had falls and some balance issues. She falls at work, at stores, and at home. She fell and had to get stitches. Some times legs  feel like they want to give away.   PERTINENT HISTORY: HTN, Schizophrenia, Falls PAIN:  Are you having pain? No  PRECAUTIONS: Fall  WEIGHT BEARING RESTRICTIONS: No  FALLS:  Has patient fallen in last 6 months? Yes. Number of falls 3-4  OCCUPATION: works at [employer]   PLOF: Independent  PATIENT GOALS: see what her strengths are   OBJECTIVE:    COGNITION: Overall cognitive status: Within functional limits for tasks assessed     SENSATION: WFL  POSTURE:  rounded shoulders and forward head   LOWER EXTREMITY ROM:  Active ROM Right eval Left eval  Hip flexion    Hip extension    Hip abduction    Hip adduction    Hip internal rotation    Hip external rotation    Knee flexion    Knee extension    Ankle dorsiflexion    Ankle plantarflexion    Ankle inversion    Ankle eversion     (Blank rows = not tested)  LOWER EXTREMITY MMT:  MMT Right eval Left eval  Hip flexion 4 4  Hip extension 3- 3-  Hip abduction 4- 3+  Hip adduction    Hip internal rotation    Hip external rotation    Knee flexion 4 4  Knee extension 4+ 4+  Ankle dorsiflexion 5 5  Ankle plantarflexion    Ankle inversion    Ankle eversion     (Blank rows = not tested)    FUNCTIONAL TESTS:  5 times sit to stand: 18.81 seconds without UE support 2 minute walk test: 310 feet Dynamic Gait Index: 16/24   GAIT: Distance walked: 310 feet Assistive device utilized: None Level of assistance: Complete Independence Comments: , antalgic on R but no c/o pain, decreased R hip extension and R ankle DF with R LE slightly Externally rotated  DGI 1. Gait level surface (2) Mild Impairment: Walks 20', uses assistive devices, slower speed, mild gait deviations. 2. Change in gait speed (2) Mild Impairment: Is able to change speed but demonstrates mild gait deviations, or not gait deviations but unable to achieve a significant change in velocity, or uses an assistive device. 3. Gait with horizontal head turns (2) Mild Impairment: Performs head turns smoothly with slight change in gait velocity, i.e., minor disruption to smooth gait path or uses walking aid. 4. Gait with vertical head turns (2) Mild Impairment: Performs head turns smoothly with slight change in gait velocity, i.e., minor disruption to smooth gait path or uses walking aid. 5. Gait and pivot turn (2) Mild Impairment: Pivot turns safely in > 3 seconds and stops with no loss of balance. 6. Step over  obstacle (2) Mild Impairment: Is able to step over box, but must slow down and adjust steps to clear box safely. 7. Step around obstacles (2) Mild Impairment: Is able to step around both cones, but must slow down and adjust steps to clear cones. 8. Stairs (2) Mild Impairment: Alternating feet, must use rail.  TOTAL SCORE: 16 / 24   TODAY'S TREATMENT:  DATE: 03/27/23 Standing:  heelraises 20X for warmup  Squats on blue foam pad with blue weighted ball 2X10  Tandem stance with 1# bar UE lift 10X each LE leading  Side stepping with GTB on 20 foot line, cues for posturing  Retro ambulation on 20 Ft line  Hip abduction with GTB 2X10  Hip extension with GTB 2X10  Forward lunges onto 4" 1 UE with power ups 10X2 each  Forward step ups 4" 1 UE with power ups 10X2 each  Lateral step ups 4" 1 UE 10X2 eccentric lowering  Forward step downs 4" with 1 UE 10X2 assist eccentric lowering  SLS max of 5 trials each LE 3" max   03/25/23 Standing:  heelraises 20X for warmup  Squats on blue foam pad with blue weighted ball 2X10  Tandem stance with 1# bar UE lift 10X each LE leading  Side stepping with GTB in bars no UE 5RT  Hip abduction with GTB 2X10  Hip extension with GTB 2X10  Forward lunges onto 4" step no UE 10X2 each  Forward step ups 4" 1 UE 10X each  03/20/2023  -Recumbent Bike x 5'- for gross LE blood flow; cues for pacing. 2-3/10 RPE -Squats w/ blue foam pad 1 x 8 w/ blue weighted theraball, 2x8 w/ orange weighted ball; CGA.  -Tandem stance on blue foam pad 3 x 30 seconds bilaterally; supervision for RLE and CGA for LLE.  -Modified single leg balance bilateral with contralateral toe down and BUE support 2 x 30 seconds.    03/19/23 Therapeutic exercise x20min -NuStep seat #7 , handles #8, L3 for warm up -Sidestepping vs G theraband in // bars x3laps  NMR x  - S2S vs Yellow Medicine Ball 2x10 in //bars  - Step-to on L2 step with Isometric holds in // bars, 3" holds     03/12/2023  -Recumbent Bike x 5'- for gross LE blood flow; cues for pacing. 2-3/10 RPE -3x5 STS with blue foam pad w/ supervision- cues for anterior weight shift and no UE support. L lateral weight shift in standing -x10 2K gram yellow ball front squats w/  blue foam pad.  -55ft x 5 with RTB side stepping with handheld support along rail cues for proper squat and movement patterns -4in stair negotiation x 4 w/o  UE support.  W/ reciprocal ascending and stepto--> reciprocal descending with cues for proper speed and movement patterns. CGA with descending without UE support.  -7in stepups x 20 bilaterally cues for no UE support and pacing.    03/06/23 LAQ 10 x 5 second holds  Bridge 1x 10  Standing hip abduction 1 x 10 bilateral    PATIENT EDUCATION:  Education details: Patient educated on exam findings, POC, scope of PT, HEP. Person educated: Patient Education method: Explanation, Demonstration, and Handouts Education comprehension: verbalized understanding, returned demonstration, verbal cues required, and tactile cues required  HOME EXERCISE PROGRAM: Access Code: [email or web address] URL: [email or web address]  Date: 03/06/2023 - Seated Long Arc Quad  - 2-3 x daily - 7 x weekly - 2 sets - 10 reps - 5 second hold - Supine Bridge  - 2-3 x daily - 7 x weekly - 2 sets - 10 reps - Standing Hip Abduction with Counter Support  - 2-3 x daily - 7 x weekly - 2 sets - 10 reps Access Code: [email or web address] URL: [email or web address] Date: 03/20/2023 Prepared by: Starling Manns  Exercises - Side Stepping with Resistance at Ankles and Counter Support  - 1 x  daily - 7 x weekly - 3 sets - 10 reps - Squat with Chair Touch  - 1 x daily - 7 x weekly - 3 sets - 10 reps - Single Leg Stance with Support  - 1 x daily - 7 x weekly - 3 sets - 10 reps  ASSESSMENT: CLINICAL  IMPRESSION: Continued focus on LE strength and stability.  Rt leading tandem activity most challenging with 1# bar lift.  Added retro gait without any instabilities or LOB.  Cues needed to keep gaze forward with retro and side stepping.  Improved ability stabilizing body with standing therex today.  Most cues needed with hip abduction/extension to keep body isolated from LE movements.  Increased all exercises to 2 sets of 10 with reduced UE assist where able.  SLS challenging with max hold of 3" without UE assist.  No updates given for HEP this session as pt reports she is busy with the HEP she has currently and are still challenging.  Pt will continue to benefit from skilled physical therapy services to address functional deficits and improve overall safety with functional mobility.     OBJECTIVE IMPAIRMENTS: Abnormal gait, decreased activity tolerance, decreased balance, decreased endurance, decreased mobility, difficulty walking, decreased strength, impaired flexibility, and improper body mechanics.   ACTIVITY LIMITATIONS: carrying, lifting, bending, standing, squatting, stairs, transfers, locomotion level, and caring for others  PARTICIPATION LIMITATIONS: meal prep, cleaning, laundry, shopping, community activity, occupation, and yard work  PERSONAL FACTORS: Fitness, Past/current experiences, Time since onset of injury/illness/exacerbation, and 3+ comorbidities: HTN, Schizophrenia, Falls  are also affecting patient's functional outcome.   REHAB POTENTIAL: Good  CLINICAL DECISION MAKING: Stable/uncomplicated  EVALUATION COMPLEXITY: Low   GOALS: Goals reviewed with patient? Yes  SHORT TERM GOALS: Target date: 03/27/2023    Patient will be independent with HEP in order to improve functional outcomes. Baseline: Goal status: INITIAL  2.  Patient will report at least 25% improvement in symptoms for improved quality of life. Baseline: Goal status: INITIAL   LONG TERM GOALS: Target  date: 04/17/2023    Patient will report at least 75% improvement in symptoms for improved quality of life. Baseline:  Goal status: INITIAL  2.  Patient will improve DGI score by at least 5 points in order to indicate improved tolerance to activity. Baseline: 16/24 Goal status: INITIAL  3.    Patient will demonstrate grade of 4+/5 MMT grade in all tested musculature as evidence of improved strength to assist with stair ambulation and gait.   Baseline: see above Goal status: INITIAL  4.  Patient will be able to ambulate at least 375 feet in in order to demonstrate improved tolerance to activity. Baseline: 310 feet Goal status: INITIAL  5.  Patient will be able to complete 5x STS in under 11.4 seconds in order to reduce the risk of falls. Baseline: 18.81 seconds Goal status: INITIAL    PLAN:  PT FREQUENCY: 2x/week  PT DURATION: 6 weeks  PLANNED INTERVENTIONS: Therapeutic exercises, Therapeutic activity, Neuromuscular re-education, Balance training, Gait training, Patient/Family education, Joint manipulation, Joint mobilization, Stair training, Orthotic/Fit training, DME instructions, Aquatic Therapy, Dry Needling, Electrical stimulation, Spinal manipulation, Spinal mobilization, Cryotherapy, Moist heat, Compression bandaging, scar mobilization, Splintting, Taping, Traction, Ultrasound, Ionotophoresis 4mg /ml Dexamethasone, and Manual therapy  PLAN FOR NEXT SESSION: Continue to work on functional strength, gait and balance training. Add vectors next session to improve glute strength and balance.    Emeline Gins B, PTA 03/27/2023, 8:15 AM

## 2023-04-01 ENCOUNTER — Ambulatory Visit (HOSPITAL_COMMUNITY): Payer: 59 | Admitting: Physical Therapy

## 2023-04-01 DIAGNOSIS — R2689 Other abnormalities of gait and mobility: Secondary | ICD-10-CM

## 2023-04-01 DIAGNOSIS — R29898 Other symptoms and signs involving the musculoskeletal system: Secondary | ICD-10-CM

## 2023-04-01 DIAGNOSIS — M6281 Muscle weakness (generalized): Secondary | ICD-10-CM | POA: Diagnosis not present

## 2023-04-01 NOTE — Therapy (Signed)
OUTPATIENT PHYSICAL THERAPY TREATMENT   Patient Name: [name] [name] MRN: [id number] DOB:1961-03-13, 62 y.o., female Today's Date: 04/01/2023  END OF SESSION:  PT End of Session - 04/01/23 0740     Visit Number 7    Number of Visits 12    Date for PT Re-Evaluation 04/17/23    Authorization Type [employer] (no auth, no VL)    Progress Note Due on Visit 10    PT Start Time [phone]    PT Stop Time 0815    PT Time Calculation (min) 39 min    Equipment Utilized During Treatment Gait belt    Activity Tolerance Patient tolerated treatment well    Behavior During Therapy WFL for tasks assessed/performed                 Past Medical History:  Diagnosis Date   BV (bacterial vaginosis) 01/28/2013   Closed nondisplaced fracture of head of radius with routine healing 03/30/2019   Encounter for gynecological examination with Papanicolaou smear of cervix 01/17/2021   Encounter for screening fecal occult blood testing 01/17/2021   Essential hypertension, benign    Hemorrhoids    Obsessive-compulsive disorders    Postmenopausal bleeding    Prolonged Q-T interval on ECG 06/02/2012   Rectal bleeding 06/21/2015   Schizoaffective disorder (HCC)    Schizophrenia (HCC) 01/12/2021   Syncope 06/02/2012   Tubular adenoma    Past Surgical History:  Procedure Laterality Date   BUNIONECTOMY     COLONOSCOPY N/A 07/05/2015   Dr.Rourk- internal hemorrhoids, redundant colon, colionic dierticulosis, colonic polyp. bx= tubular adenoma. next tcs 06/2020.    COLONOSCOPY WITH PROPOFOL N/A 10/12/2020   one 3 mm polyp in the cecum (tubular adenoma), sigmoid and descending colon diverticulosis.    HEMORRHOID BANDING  2017   Dr.Rourk   POLYPECTOMY  10/12/2020   Procedure: POLYPECTOMY INTESTINAL;  Surgeon: Corbin Ade, MD;  Location: [facility];  Service: Endoscopy;;  cecal colon polyp;    Patient Active Problem List   Diagnosis Date Noted   Falls 01/28/2023   Frequent stools 11/28/2022   High  risk medication monitoring: divalproex 05/30/2022   Long term current use of antipsychotic medication 05/30/2022   Tremor 05/30/2022   Memory difficulties 05/30/2022   Internal hemorrhoids 07/25/2021   Prediabetes 01/13/2021   Morbid obesity (HCC) 01/13/2021   Primary insomnia 01/12/2021   Generalized anxiety disorder 01/12/2021   Mixed hyperlipidemia 01/07/2021   Hypothyroidism 01/07/2021   Schizoaffective disorder, depressive type (HCC) 05/08/2017   History of colonic polyps    Diverticulosis of colon without hemorrhage    Hemorrhoid    Family hx of colon cancer 06/21/2015   Postmenopausal bleeding 03/18/2013   BV (bacterial vaginosis) 01/28/2013   Murmur, cardiac 06/02/2012   Essential hypertension, benign 06/02/2012    PCP: Benita Stabile MD  REFERRING PROVIDER: Benita Stabile, MD  REFERRING DIAG: R26.89 (ICD-10-CM) - Other abnormalities of gait and mobility  THERAPY DIAG:  Other abnormalities of gait and mobility  Other symptoms and signs involving the musculoskeletal system  Muscle weakness (generalized)  Rationale for Evaluation and Treatment: Rehabilitation  ONSET DATE: 1 year  SUBJECTIVE:   SUBJECTIVE STATEMENT: Pt states she is doing well without pain or issues. Goes to work today after therapy.  Eval:Patient states she has had falls and some balance issues. She falls at work, at stores, and at home. She fell and had to get stitches. Some times legs feel like they want to give away.  PERTINENT HISTORY: HTN, Schizophrenia, Falls PAIN:  Are you having pain? No  PRECAUTIONS: Fall  WEIGHT BEARING RESTRICTIONS: No  FALLS:  Has patient fallen in last 6 months? Yes. Number of falls 3-4  OCCUPATION: works at [employer]   PLOF: Independent  PATIENT GOALS: see what her strengths are   OBJECTIVE:    COGNITION: Overall cognitive status: Within functional limits for tasks assessed     SENSATION: WFL  POSTURE: rounded shoulders and forward  head   LOWER EXTREMITY MMT:  MMT Right eval Left eval  Hip flexion 4 4  Hip extension 3- 3-  Hip abduction 4- 3+  Hip adduction    Hip internal rotation    Hip external rotation    Knee flexion 4 4  Knee extension 4+ 4+  Ankle dorsiflexion 5 5  Ankle plantarflexion    Ankle inversion    Ankle eversion     (Blank rows = not tested)    FUNCTIONAL TESTS:  5 times sit to stand: 18.81 seconds without UE support 2 minute walk test: 310 feet Dynamic Gait Index: 16/24   GAIT: Distance walked: 310 feet Assistive device utilized: None Level of assistance: Complete Independence Comments: , antalgic on R but no c/o pain, decreased R hip extension and R ankle DF with R LE slightly Externally rotated  DGI 1. Gait level surface (2) Mild Impairment: Walks 20', uses assistive devices, slower speed, mild gait deviations. 2. Change in gait speed (2) Mild Impairment: Is able to change speed but demonstrates mild gait deviations, or not gait deviations but unable to achieve a significant change in velocity, or uses an assistive device. 3. Gait with horizontal head turns (2) Mild Impairment: Performs head turns smoothly with slight change in gait velocity, i.e., minor disruption to smooth gait path or uses walking aid. 4. Gait with vertical head turns (2) Mild Impairment: Performs head turns smoothly with slight change in gait velocity, i.e., minor disruption to smooth gait path or uses walking aid. 5. Gait and pivot turn (2) Mild Impairment: Pivot turns safely in > 3 seconds and stops with no loss of balance. 6. Step over obstacle (2) Mild Impairment: Is able to step over box, but must slow down and adjust steps to clear box safely. 7. Step around obstacles (2) Mild Impairment: Is able to step around both cones, but must slow down and adjust steps to clear cones. 8. Stairs (2) Mild Impairment: Alternating feet, must use rail.  TOTAL SCORE: 16 / 24   TODAY'S TREATMENT:                                                                                                                               DATE: 04/01/23 Standing:  heelraises 20X for warmup  Squats on blue foam pad with blue weighted ball 2X10  Tandem stance with 1# bar UE lift 10X each LE leading  Side stepping with GTB on 20 foot line, cues for  posturing 3RT  Retro ambulation on 20 Ft line 3RT  Hip abduction with GTB 2X10  Hip extension with GTB 2X10  Vectors with 1 HHA 10X5" each LE   03/27/23 Standing:  heelraises 20X for warmup  Squats on blue foam pad with blue weighted ball 2X10  Tandem stance with 1# bar UE lift 10X each LE leading  Side stepping with GTB on 20 foot line, cues for posturing  Retro ambulation on 20 Ft line  Hip abduction with GTB 2X10  Hip extension with GTB 2X10  Forward lunges onto 4" 1 UE with power ups 10X2 each  Forward step ups 4" 1 UE with power ups 10X2 each  Lateral step ups 4" 1 UE 10X2 eccentric lowering  Forward step downs 4" with 1 UE 10X2 assist eccentric lowering  SLS max of 5 trials each LE 3" max   03/25/23 Standing:  heelraises 20X for warmup  Squats on blue foam pad with blue weighted ball 2X10  Tandem stance with 1# bar UE lift 10X each LE leading  Side stepping with GTB in bars no UE 5RT  Hip abduction with GTB 2X10  Hip extension with GTB 2X10  Forward lunges onto 4" step no UE 10X2 each  Forward step ups 4" 1 UE 10X each  03/20/2023  -Recumbent Bike x 5'- for gross LE blood flow; cues for pacing. 2-3/10 RPE -Squats w/ blue foam pad 1 x 8 w/ blue weighted theraball, 2x8 w/ orange weighted ball; CGA.  -Tandem stance on blue foam pad 3 x 30 seconds bilaterally; supervision for RLE and CGA for LLE.  -Modified single leg balance bilateral with contralateral toe down and BUE support 2 x 30 seconds.    03/19/23 Therapeutic exercise x20min -NuStep seat #7 , handles #8, L3 for warm up -Sidestepping vs G theraband in // bars x3laps  NMR x - S2S vs  Yellow Medicine Ball 2x10 in //bars  - Step-to on L2 step with Isometric holds in // bars, 3" holds     03/12/2023  -Recumbent Bike x 5'- for gross LE blood flow; cues for pacing. 2-3/10 RPE -3x5 STS with blue foam pad w/ supervision- cues for anterior weight shift and no UE support. L lateral weight shift in standing -x10 2K gram yellow ball front squats w/  blue foam pad.  -72ft x 5 with RTB side stepping with handheld support along rail cues for proper squat and movement patterns -4in stair negotiation x 4 w/o  UE support.  W/ reciprocal ascending and stepto--> reciprocal descending with cues for proper speed and movement patterns. CGA with descending without UE support.  -7in stepups x 20 bilaterally cues for no UE support and pacing.    03/06/23 LAQ 10 x 5 second holds  Bridge 1x 10  Standing hip abduction 1 x 10 bilateral    PATIENT EDUCATION:  Education details: Patient educated on exam findings, POC, scope of PT, HEP. Person educated: Patient Education method: Explanation, Demonstration, and Handouts Education comprehension: verbalized understanding, returned demonstration, verbal cues required, and tactile cues required  HOME EXERCISE PROGRAM: Access Code: [email or web address] URL: [email or web address]  Date: 03/06/2023 - Seated Long Arc Quad  - 2-3 x daily - 7 x weekly - 2 sets - 10 reps - 5 second hold - Supine Bridge  - 2-3 x daily - 7 x weekly - 2 sets - 10 reps - Standing Hip Abduction with Counter Support  - 2-3 x daily - 7 x weekly - 2  sets - 10 reps Access Code: [email or web address] URL: [email or web address] Date: 03/20/2023 Prepared by: Starling Manns  Exercises - Side Stepping with Resistance at Ankles and Counter Support  - 1 x daily - 7 x weekly - 3 sets - 10 reps - Squat with Chair Touch  - 1 x daily - 7 x weekly - 3 sets - 10 reps - Single Leg Stance with Support  - 1 x daily - 7 x weekly - 3 sets - 10 reps  ASSESSMENT: CLINICAL  IMPRESSION: Continued focus on LE strength and stability. Keeping forward gaze and core stab are still most challenging for patient. Added vectors this session to challenge gluts as single leg stance remains challenging for patient.  No updates given for HEP this session.  Pt will continue to benefit from skilled physical therapy services to address functional deficits and improve overall safety with functional mobility.   OBJECTIVE IMPAIRMENTS: Abnormal gait, decreased activity tolerance, decreased balance, decreased endurance, decreased mobility, difficulty walking, decreased strength, impaired flexibility, and improper body mechanics.   ACTIVITY LIMITATIONS: carrying, lifting, bending, standing, squatting, stairs, transfers, locomotion level, and caring for others  PARTICIPATION LIMITATIONS: meal prep, cleaning, laundry, shopping, community activity, occupation, and yard work  PERSONAL FACTORS: Fitness, Past/current experiences, Time since onset of injury/illness/exacerbation, and 3+ comorbidities: HTN, Schizophrenia, Falls  are also affecting patient's functional outcome.   REHAB POTENTIAL: Good  CLINICAL DECISION MAKING: Stable/uncomplicated  EVALUATION COMPLEXITY: Low   GOALS: Goals reviewed with patient? Yes  SHORT TERM GOALS: Target date: 03/27/2023    Patient will be independent with HEP in order to improve functional outcomes. Baseline: Goal status: IN PROGRESS  2.  Patient will report at least 25% improvement in symptoms for improved quality of life. Baseline: Goal status: IN PROGRESS   LONG TERM GOALS: Target date: 04/17/2023    Patient will report at least 75% improvement in symptoms for improved quality of life. Baseline:  Goal status: IN PROGRESS  2.  Patient will improve DGI score by at least 5 points in order to indicate improved tolerance to activity. Baseline: 16/24 Goal status: IN PROGRESS  3.    Patient will demonstrate grade of 4+/5 MMT grade in all  tested musculature as evidence of improved strength to assist with stair ambulation and gait.   Baseline: see above Goal status: IN PROGRESS  4.  Patient will be able to ambulate at least 375 feet in in order to demonstrate improved tolerance to activity. Baseline: 310 feet Goal status: IN PROGRESS  5.  Patient will be able to complete 5x STS in under 11.4 seconds in order to reduce the risk of falls. Baseline: 18.81 seconds Goal status: IN PROGRESS    PLAN:  PT FREQUENCY: 2x/week  PT DURATION: 6 weeks  PLANNED INTERVENTIONS: Therapeutic exercises, Therapeutic activity, Neuromuscular re-education, Balance training, Gait training, Patient/Family education, Joint manipulation, Joint mobilization, Stair training, Orthotic/Fit training, DME instructions, Aquatic Therapy, Dry Needling, Electrical stimulation, Spinal manipulation, Spinal mobilization, Cryotherapy, Moist heat, Compression bandaging, scar mobilization, Splintting, Taping, Traction, Ultrasound, Ionotophoresis 4mg /ml Dexamethasone, and Manual therapy  PLAN FOR NEXT SESSION: Continue to work on functional strength, gait and balance training. Update HEP next session to include vectors (3 way kick on medbridge).   Emeline Gins B, PTA 04/01/2023, 9:38 AM

## 2023-04-03 ENCOUNTER — Ambulatory Visit (HOSPITAL_COMMUNITY): Payer: 59 | Admitting: Psychiatry

## 2023-04-03 ENCOUNTER — Ambulatory Visit (HOSPITAL_COMMUNITY): Payer: 59 | Admitting: Physical Therapy

## 2023-04-03 DIAGNOSIS — F251 Schizoaffective disorder, depressive type: Secondary | ICD-10-CM

## 2023-04-03 DIAGNOSIS — R2689 Other abnormalities of gait and mobility: Secondary | ICD-10-CM | POA: Diagnosis not present

## 2023-04-03 DIAGNOSIS — R29898 Other symptoms and signs involving the musculoskeletal system: Secondary | ICD-10-CM

## 2023-04-03 DIAGNOSIS — M545 Low back pain, unspecified: Secondary | ICD-10-CM | POA: Diagnosis not present

## 2023-04-03 DIAGNOSIS — M6281 Muscle weakness (generalized): Secondary | ICD-10-CM | POA: Diagnosis not present
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
## 2023-04-03 NOTE — Therapy (Signed)
OUTPATIENT PHYSICAL THERAPY TREATMENT   Patient Name: [name] [name] MRN: [id number] DOB:[birth year], 62 y.o., female Today's Date: 04/03/2023  END OF SESSION:  PT End of Session - 04/03/23 0740     Visit Number 8    Number of Visits 12    Date for PT Re-Evaluation 04/17/23    Authorization Type [employer] (no auth, no VL)    Progress Note Due on Visit 10    PT Start Time 0732    PT Stop Time 0815    PT Time Calculation (min) 43 min    Equipment Utilized During Treatment Gait belt    Activity Tolerance Patient tolerated treatment well    Behavior During Therapy WFL for tasks assessed/performed                 Past Medical History:  Diagnosis Date   BV (bacterial vaginosis) 01/28/2013   Closed nondisplaced fracture of head of radius with routine healing 03/30/2019   Encounter for gynecological examination with Papanicolaou smear of cervix 01/17/2021   Encounter for screening fecal occult blood testing 01/17/2021   Essential hypertension, benign    Hemorrhoids    Obsessive-compulsive disorders    Postmenopausal bleeding    Prolonged Q-T interval on ECG 06/02/2012   Rectal bleeding 06/21/2015   Schizoaffective disorder (HCC)    Schizophrenia (HCC) 01/12/2021   Syncope 06/02/2012   Tubular adenoma    Past Surgical History:  Procedure Laterality Date   BUNIONECTOMY     COLONOSCOPY N/A 07/05/2015   Dr.Rourk- internal hemorrhoids, redundant colon, colionic dierticulosis, colonic polyp. bx= tubular adenoma. next tcs 06/2020.    COLONOSCOPY WITH PROPOFOL N/A 10/12/2020   one 3 mm polyp in the cecum (tubular adenoma), sigmoid and descending colon diverticulosis.    HEMORRHOID BANDING  2017   Dr.Rourk   POLYPECTOMY  10/12/2020   Procedure: POLYPECTOMY INTESTINAL;  Surgeon: Corbin Ade, MD;  Location: [facility];  Service: Endoscopy;;  cecal colon polyp;    Patient Active Problem List   Diagnosis Date Noted   Falls 01/28/2023   Frequent stools 11/28/2022   High  risk medication monitoring: divalproex 05/30/2022   Long term current use of antipsychotic medication 05/30/2022   Tremor 05/30/2022   Memory difficulties 05/30/2022   Internal hemorrhoids 07/25/2021   Prediabetes 01/13/2021   Morbid obesity (HCC) 01/13/2021   Primary insomnia 01/12/2021   Generalized anxiety disorder 01/12/2021   Mixed hyperlipidemia 01/07/2021   Hypothyroidism 01/07/2021   Schizoaffective disorder, depressive type (HCC) 05/08/2017   History of colonic polyps    Diverticulosis of colon without hemorrhage    Hemorrhoid    Family hx of colon cancer 06/21/2015   Postmenopausal bleeding 03/18/2013   BV (bacterial vaginosis) 01/28/2013   Murmur, cardiac 06/02/2012   Essential hypertension, benign 06/02/2012    PCP: Benita Stabile MD  REFERRING PROVIDER: Benita Stabile, MD  REFERRING DIAG: R26.89 (ICD-10-CM) - Other abnormalities of gait and mobility  THERAPY DIAG:  Other abnormalities of gait and mobility  Other symptoms and signs involving the musculoskeletal system  Muscle weakness (generalized)  Rationale for Evaluation and Treatment: Rehabilitation  ONSET DATE: 1 year  SUBJECTIVE:   SUBJECTIVE STATEMENT: Pt states she stopped at [employer] yesterday and when she stepped up she got a sudden intense pain in Rt lower back.  States it has decreased since then after doing her exercises.  Currently 4/10 in Rt lower back.  States she is off work today but has appointments.  Eval:Patient  states she has had falls and some balance issues. She falls at work, at stores, and at home. She fell and had to get stitches. Some times legs feel like they want to give away.   PERTINENT HISTORY: HTN, Schizophrenia, Falls PAIN:  Are you having pain? No  PRECAUTIONS: Fall  WEIGHT BEARING RESTRICTIONS: No  FALLS:  Has patient fallen in last 6 months? Yes. Number of falls 3-4  OCCUPATION: works at [employer]   PLOF: Independent  PATIENT GOALS: see what her strengths  are   OBJECTIVE:    COGNITION: Overall cognitive status: Within functional limits for tasks assessed     SENSATION: WFL  POSTURE: rounded shoulders and forward head   LOWER EXTREMITY MMT:  MMT Right eval Left eval  Hip flexion 4 4  Hip extension 3- 3-  Hip abduction 4- 3+  Hip adduction    Hip internal rotation    Hip external rotation    Knee flexion 4 4  Knee extension 4+ 4+  Ankle dorsiflexion 5 5  Ankle plantarflexion    Ankle inversion    Ankle eversion     (Blank rows = not tested)    FUNCTIONAL TESTS:  5 times sit to stand: 18.81 seconds without UE support 2 minute walk test: 310 feet Dynamic Gait Index: 16/24   GAIT: Distance walked: 310 feet Assistive device utilized: None Level of assistance: Complete Independence Comments: , antalgic on R but no c/o pain, decreased R hip extension and R ankle DF with R LE slightly Externally rotated  DGI 1. Gait level surface (2) Mild Impairment: Walks 20', uses assistive devices, slower speed, mild gait deviations. 2. Change in gait speed (2) Mild Impairment: Is able to change speed but demonstrates mild gait deviations, or not gait deviations but unable to achieve a significant change in velocity, or uses an assistive device. 3. Gait with horizontal head turns (2) Mild Impairment: Performs head turns smoothly with slight change in gait velocity, i.e., minor disruption to smooth gait path or uses walking aid. 4. Gait with vertical head turns (2) Mild Impairment: Performs head turns smoothly with slight change in gait velocity, i.e., minor disruption to smooth gait path or uses walking aid. 5. Gait and pivot turn (2) Mild Impairment: Pivot turns safely in > 3 seconds and stops with no loss of balance. 6. Step over obstacle (2) Mild Impairment: Is able to step over box, but must slow down and adjust steps to clear box safely. 7. Step around obstacles (2) Mild Impairment: Is able to step around both cones, but  must slow down and adjust steps to clear cones. 8. Stairs (2) Mild Impairment: Alternating feet, must use rail.  TOTAL SCORE: 16 / 24   TODAY'S TREATMENT:                                                                                                                              DATE: 04/03/23 Standing:  heelraises  20X for warmup  Hip abduction with GTB 2X10  Hip extension with GTB 2X10  Squats on blue foam pad with blue weighted ball 2X10  Tandem stance on aeromat with 1# bar UE lift 10X each LE leading  Side stepping with GTB on 20 foot line, cues for posturing 3RT  Retro ambulation on 20 Ft line 3RT  Vectors with 1 HHA 10X5" each LE  Nustep beach 5 minutes level 4 seat 5  04/01/23 Standing:  heelraises 20X for warmup  Squats on blue foam pad with blue weighted ball 2X10  Tandem stance with 1# bar UE lift 10X each LE leading  Side stepping with GTB on 10 foot line, cues for posturing 5RT  Retro ambulation on 20 Ft line 3RT  Hip abduction with GTB 2X10  Hip extension with GTB 2X10  Vectors with 1 HHA 10X5" each LE   03/27/23 Standing:  heelraises 20X for warmup  Squats on blue foam pad with blue weighted ball 2X10  Tandem stance with 1# bar UE lift 10X each LE leading  Side stepping with GTB on 20 foot line, cues for posturing  Retro ambulation on 20 Ft line  Hip abduction with GTB 2X10  Hip extension with GTB 2X10  Forward lunges onto 4" 1 UE with power ups 10X2 each  Forward step ups 4" 1 UE with power ups 10X2 each  Lateral step ups 4" 1 UE 10X2 eccentric lowering  Forward step downs 4" with 1 UE 10X2 assist eccentric lowering  SLS max of 5 trials each LE 3" max   03/25/23 Standing:  heelraises 20X for warmup  Squats on blue foam pad with blue weighted ball 2X10  Tandem stance with 1# bar UE lift 10X each LE leading  Side stepping with GTB in bars no UE 5RT  Hip abduction with GTB 2X10  Hip extension with GTB 2X10  Forward lunges onto 4" step no UE 10X2  each  Forward step ups 4" 1 UE 10X each  03/20/2023  -Recumbent Bike x 5'- for gross LE blood flow; cues for pacing. 2-3/10 RPE -Squats w/ blue foam pad 1 x 8 w/ blue weighted theraball, 2x8 w/ orange weighted ball; CGA.  -Tandem stance on blue foam pad 3 x 30 seconds bilaterally; supervision for RLE and CGA for LLE.  -Modified single leg balance bilateral with contralateral toe down and BUE support 2 x 30 seconds.    03/19/23 Therapeutic exercise x22min -NuStep seat #7 , handles #8, L3 for warm up -Sidestepping vs G theraband in // bars x3laps  NMR x - S2S vs Yellow Medicine Ball 2x10 in //bars  - Step-to on L2 step with Isometric holds in // bars, 3" holds     03/12/2023  -Recumbent Bike x 5'- for gross LE blood flow; cues for pacing. 2-3/10 RPE -3x5 STS with blue foam pad w/ supervision- cues for anterior weight shift and no UE support. L lateral weight shift in standing -x10 2K gram yellow ball front squats w/  blue foam pad.  -23ft x 5 with RTB side stepping with handheld support along rail cues for proper squat and movement patterns -4in stair negotiation x 4 w/o  UE support.  W/ reciprocal ascending and stepto--> reciprocal descending with cues for proper speed and movement patterns. CGA with descending without UE support.  -7in stepups x 20 bilaterally cues for no UE support and pacing.    03/06/23 LAQ 10 x 5 second holds  Bridge 1x 10  Standing hip abduction 1 x 10 bilateral    PATIENT EDUCATION:  Education details: Patient educated on exam findings, POC, scope of PT, HEP. Person educated: Patient Education method: Explanation, Demonstration, and Handouts Education comprehension: verbalized understanding, returned demonstration, verbal cues required, and tactile cues required  HOME EXERCISE PROGRAM: Access Code: [email or web address] URL: [email or web address]  Date: 03/06/2023 - Seated Long Arc Quad  - 2-3 x daily - 7 x weekly - 2 sets - 10 reps - 5  second hold - Supine Bridge  - 2-3 x daily - 7 x weekly - 2 sets - 10 reps - Standing Hip Abduction with Counter Support  - 2-3 x daily - 7 x weekly - 2 sets - 10 reps  Date: 03/20/2023 Prepared by: Starling Manns Exercises - Side Stepping with Resistance at Ankles and Counter Support  - 1 x daily - 7 x weekly - 3 sets - 10 reps - Squat with Chair Touch  - 1 x daily - 7 x weekly - 3 sets - 10 reps - Single Leg Stance with Support  - 1 x daily - 7 x weekly - 3 sets - 10 reps  Date: 04/03/2023 Prepared by: Emeline Gins Exercises - Standing Hip Extension with Counter Support  - 2-3 x daily - 7 x weekly - 2 sets - 10 reps - Standing Lumbar Extension  - 2-3 x daily - 7 x weekly - 2 sets - 10 reps - Standing 3-Way Kick  - 2-3 x daily - 7 x weekly - 2 sets - 10 reps - Squat with Chair Touch  - 2-3 x daily - 7 x weekly - 2 sets - 10 reps  ASSESSMENT: CLINICAL IMPRESSION: Focus on LE strength and stability. Cues continued for core stab, especially with standing hip strengthening.  Instructed with lumbar extension as increased c/o lower back discomfort.  Increased difficulty of tandem UE lift in // bars with addition of aeromat to challenge stability.  Pt required CGA to MinA to complete safely.  Concluded session on nustep today.   Updated HEP to include vectors, squats, lumbar extension and hip strengthening with green band.   Pt will continue to benefit from skilled physical therapy services to address functional deficits and improve overall safety with functional mobility.   OBJECTIVE IMPAIRMENTS: Abnormal gait, decreased activity tolerance, decreased balance, decreased endurance, decreased mobility, difficulty walking, decreased strength, impaired flexibility, and improper body mechanics.   ACTIVITY LIMITATIONS: carrying, lifting, bending, standing, squatting, stairs, transfers, locomotion level, and caring for others  PARTICIPATION LIMITATIONS: meal prep, cleaning, laundry, shopping, community  activity, occupation, and yard work  PERSONAL FACTORS: Fitness, Past/current experiences, Time since onset of injury/illness/exacerbation, and 3+ comorbidities: HTN, Schizophrenia, Falls  are also affecting patient's functional outcome.   REHAB POTENTIAL: Good  CLINICAL DECISION MAKING: Stable/uncomplicated  EVALUATION COMPLEXITY: Low   GOALS: Goals reviewed with patient? Yes  SHORT TERM GOALS: Target date: 03/27/2023    Patient will be independent with HEP in order to improve functional outcomes. Baseline: Goal status: IN PROGRESS  2.  Patient will report at least 25% improvement in symptoms for improved quality of life. Baseline: Goal status: IN PROGRESS   LONG TERM GOALS: Target date: 04/17/2023    Patient will report at least 75% improvement in symptoms for improved quality of life. Baseline:  Goal status: IN PROGRESS  2.  Patient will improve DGI score by at least 5 points in order to indicate improved tolerance to activity. Baseline: 16/24 Goal status: IN PROGRESS  3.  Patient will demonstrate grade of 4+/5 MMT grade in all tested musculature as evidence of improved strength to assist with stair ambulation and gait.   Baseline: see above Goal status: IN PROGRESS  4.  Patient will be able to ambulate at least 375 feet in in order to demonstrate improved tolerance to activity. Baseline: 310 feet Goal status: IN PROGRESS  5.  Patient will be able to complete 5x STS in under 11.4 seconds in order to reduce the risk of falls. Baseline: 18.81 seconds Goal status: IN PROGRESS    PLAN:  PT FREQUENCY: 2x/week  PT DURATION: 6 weeks  PLANNED INTERVENTIONS: Therapeutic exercises, Therapeutic activity, Neuromuscular re-education, Balance training, Gait training, Patient/Family education, Joint manipulation, Joint mobilization, Stair training, Orthotic/Fit training, DME instructions, Aquatic Therapy, Dry Needling, Electrical stimulation, Spinal manipulation,  Spinal mobilization, Cryotherapy, Moist heat, Compression bandaging, scar mobilization, Splintting, Taping, Traction, Ultrasound, Ionotophoresis 4mg /ml Dexamethasone, and Manual therapy  PLAN FOR NEXT SESSION: Continue to work on functional strength, gait and balance training.   Bascom Levels, Aaryn Parrilla B, PTA 04/03/2023, 8:16 AM

## 2023-04-08 ENCOUNTER — Ambulatory Visit (HOSPITAL_COMMUNITY): Payer: 59 | Attending: Internal Medicine

## 2023-04-08 DIAGNOSIS — M6281 Muscle weakness (generalized): Secondary | ICD-10-CM | POA: Diagnosis not present

## 2023-04-08 DIAGNOSIS — R2689 Other abnormalities of gait and mobility: Secondary | ICD-10-CM

## 2023-04-08 DIAGNOSIS — R29898 Other symptoms and signs involving the musculoskeletal system: Secondary | ICD-10-CM | POA: Diagnosis not present

## 2023-04-08 NOTE — Therapy (Signed)
OUTPATIENT PHYSICAL THERAPY TREATMENT   Patient Name: [name] [name] MRN: [id number] DOB:01-30-1961, 62 y.o., female Today's Date: 04/08/2023  END OF SESSION:  PT End of Session - 04/08/23 0736     Visit Number 9    Number of Visits 12    Date for PT Re-Evaluation 04/17/23    Authorization Type [employer] (no auth, no VL)    Authorization - Visit Number 4    Progress Note Due on Visit 10    PT Start Time [phone]    PT Stop Time 0813    PT Time Calculation (min) 40 min    Equipment Utilized During Treatment Gait belt    Activity Tolerance Patient tolerated treatment well    Behavior During Therapy WFL for tasks assessed/performed                 Past Medical History:  Diagnosis Date   BV (bacterial vaginosis) 01/28/2013   Closed nondisplaced fracture of head of radius with routine healing 03/30/2019   Encounter for gynecological examination with Papanicolaou smear of cervix 01/17/2021   Encounter for screening fecal occult blood testing 01/17/2021   Essential hypertension, benign    Hemorrhoids    Obsessive-compulsive disorders    Postmenopausal bleeding    Prolonged Q-T interval on ECG 06/02/2012   Rectal bleeding 06/21/2015   Schizoaffective disorder (HCC)    Schizophrenia (HCC) 01/12/2021   Syncope 06/02/2012   Tubular adenoma    Past Surgical History:  Procedure Laterality Date   BUNIONECTOMY     COLONOSCOPY N/A 07/05/2015   Dr.Rourk- internal hemorrhoids, redundant colon, colionic dierticulosis, colonic polyp. bx= tubular adenoma. next tcs 06/2020.    COLONOSCOPY WITH PROPOFOL N/A 10/12/2020   one 3 mm polyp in the cecum (tubular adenoma), sigmoid and descending colon diverticulosis.    HEMORRHOID BANDING  2017   Dr.Rourk   POLYPECTOMY  10/12/2020   Procedure: POLYPECTOMY INTESTINAL;  Surgeon: Corbin Ade, MD;  Location: [facility];  Service: Endoscopy;;  cecal colon polyp;    Patient Active Problem List   Diagnosis Date Noted   Falls 01/28/2023    Frequent stools 11/28/2022   High risk medication monitoring: divalproex 05/30/2022   Long term current use of antipsychotic medication 05/30/2022   Tremor 05/30/2022   Memory difficulties 05/30/2022   Internal hemorrhoids 07/25/2021   Prediabetes 01/13/2021   Morbid obesity (HCC) 01/13/2021   Primary insomnia 01/12/2021   Generalized anxiety disorder 01/12/2021   Mixed hyperlipidemia 01/07/2021   Hypothyroidism 01/07/2021   Schizoaffective disorder, depressive type (HCC) 05/08/2017   History of colonic polyps    Diverticulosis of colon without hemorrhage    Hemorrhoid    Family hx of colon cancer 06/21/2015   Postmenopausal bleeding 03/18/2013   BV (bacterial vaginosis) 01/28/2013   Murmur, cardiac 06/02/2012   Essential hypertension, benign 06/02/2012    PCP: Benita Stabile MD  REFERRING PROVIDER: Benita Stabile, MD  REFERRING DIAG: R26.89 (ICD-10-CM) - Other abnormalities of gait and mobility  THERAPY DIAG:  Other abnormalities of gait and mobility  Other symptoms and signs involving the musculoskeletal system  Muscle weakness (generalized)  Rationale for Evaluation and Treatment: Rehabilitation  ONSET DATE: 1 year  SUBJECTIVE:   SUBJECTIVE STATEMENT: No pain this morning; doing better overall; had some pain last night after work but took some ibuprofen and that helped; maybe "55%" better  Eval:Patient states she has had falls and some balance issues. She falls at work, at stores, and at home. She  fell and had to get stitches. Some times legs feel like they want to give away.   PERTINENT HISTORY: HTN, Schizophrenia, Falls PAIN:  Are you having pain? No  PRECAUTIONS: Fall  WEIGHT BEARING RESTRICTIONS: No  FALLS:  Has patient fallen in last 6 months? Yes. Number of falls 3-4  OCCUPATION: works at [employer]   PLOF: Independent  PATIENT GOALS: see what her strengths are   OBJECTIVE:    COGNITION: Overall cognitive status: Within functional limits for  tasks assessed     SENSATION: WFL  POSTURE: rounded shoulders and forward head   LOWER EXTREMITY MMT:  MMT Right eval Left eval  Hip flexion 4 4  Hip extension 3- 3-  Hip abduction 4- 3+  Hip adduction    Hip internal rotation    Hip external rotation    Knee flexion 4 4  Knee extension 4+ 4+  Ankle dorsiflexion 5 5  Ankle plantarflexion    Ankle inversion    Ankle eversion     (Blank rows = not tested)    FUNCTIONAL TESTS:  5 times sit to stand: 18.81 seconds without UE support 2 minute walk test: 310 feet Dynamic Gait Index: 16/24   GAIT: Distance walked: 310 feet Assistive device utilized: None Level of assistance: Complete Independence Comments: , antalgic on R but no c/o pain, decreased R hip extension and R ankle DF with R LE slightly Externally rotated  DGI 1. Gait level surface (2) Mild Impairment: Walks 20', uses assistive devices, slower speed, mild gait deviations. 2. Change in gait speed (2) Mild Impairment: Is able to change speed but demonstrates mild gait deviations, or not gait deviations but unable to achieve a significant change in velocity, or uses an assistive device. 3. Gait with horizontal head turns (2) Mild Impairment: Performs head turns smoothly with slight change in gait velocity, i.e., minor disruption to smooth gait path or uses walking aid. 4. Gait with vertical head turns (2) Mild Impairment: Performs head turns smoothly with slight change in gait velocity, i.e., minor disruption to smooth gait path or uses walking aid. 5. Gait and pivot turn (2) Mild Impairment: Pivot turns safely in > 3 seconds and stops with no loss of balance. 6. Step over obstacle (2) Mild Impairment: Is able to step over box, but must slow down and adjust steps to clear box safely. 7. Step around obstacles (2) Mild Impairment: Is able to step around both cones, but must slow down and adjust steps to clear cones. 8. Stairs (2) Mild Impairment: Alternating  feet, must use rail.  TOTAL SCORE: 16 / 24   TODAY'S TREATMENT:                                                                                                                              DATE: 04/08/23 Nustep 5 minutes level 4 seat 5 [place] jog  Standing: Heel raises on incline x 20 Hip vectors 5" hold  x 5 each Walkouts using pull bar retro 3 plates x 10 6" box alternating lunges Scapular retractions RTB 2 x 10 Shoulder extensions RTB 2 x 10  Leg press bodycraft 4 plates 2 x 10    04/03/23 Standing:  heelraises 20X for warmup  Hip abduction with GTB 2X10  Hip extension with GTB 2X10  Squats on blue foam pad with blue weighted ball 2X10  Tandem stance on aeromat with 1# bar UE lift 10X each LE leading  Side stepping with GTB on 20 foot line, cues for posturing 3RT  Retro ambulation on 20 Ft line 3RT  Vectors with 1 HHA 10X5" each LE  Nustep beach 5 minutes level 4 seat 5  04/01/23 Standing:  heelraises 20X for warmup  Squats on blue foam pad with blue weighted ball 2X10  Tandem stance with 1# bar UE lift 10X each LE leading  Side stepping with GTB on 10 foot line, cues for posturing 5RT  Retro ambulation on 20 Ft line 3RT  Hip abduction with GTB 2X10  Hip extension with GTB 2X10  Vectors with 1 HHA 10X5" each LE   03/27/23 Standing:  heelraises 20X for warmup  Squats on blue foam pad with blue weighted ball 2X10  Tandem stance with 1# bar UE lift 10X each LE leading  Side stepping with GTB on 20 foot line, cues for posturing  Retro ambulation on 20 Ft line  Hip abduction with GTB 2X10  Hip extension with GTB 2X10  Forward lunges onto 4" 1 UE with power ups 10X2 each  Forward step ups 4" 1 UE with power ups 10X2 each  Lateral step ups 4" 1 UE 10X2 eccentric lowering  Forward step downs 4" with 1 UE 10X2 assist eccentric lowering  SLS max of 5 trials each LE 3" max   03/25/23 Standing:  heelraises 20X for warmup  Squats on blue foam pad with blue weighted  ball 2X10  Tandem stance with 1# bar UE lift 10X each LE leading  Side stepping with GTB in bars no UE 5RT  Hip abduction with GTB 2X10  Hip extension with GTB 2X10  Forward lunges onto 4" step no UE 10X2 each  Forward step ups 4" 1 UE 10X each  03/20/2023  -Recumbent Bike x 5'- for gross LE blood flow; cues for pacing. 2-3/10 RPE -Squats w/ blue foam pad 1 x 8 w/ blue weighted theraball, 2x8 w/ orange weighted ball; CGA.  -Tandem stance on blue foam pad 3 x 30 seconds bilaterally; supervision for RLE and CGA for LLE.  -Modified single leg balance bilateral with contralateral toe down and BUE support 2 x 30 seconds.    03/19/23 Therapeutic exercise x61min -NuStep seat #7 , handles #8, L3 for warm up -Sidestepping vs G theraband in // bars x3laps  NMR x - S2S vs Yellow Medicine Ball 2x10 in //bars  - Step-to on L2 step with Isometric holds in // bars, 3" holds     03/12/2023  -Recumbent Bike x 5'- for gross LE blood flow; cues for pacing. 2-3/10 RPE -3x5 STS with blue foam pad w/ supervision- cues for anterior weight shift and no UE support. L lateral weight shift in standing -x10 2K gram yellow ball front squats w/  blue foam pad.  -74ft x 5 with RTB side stepping with handheld support along rail cues for proper squat and movement patterns -4in stair negotiation x 4 w/o  UE support.  W/ reciprocal ascending and  stepto--> reciprocal descending with cues for proper speed and movement patterns. CGA with descending without UE support.  -7in stepups x 20 bilaterally cues for no UE support and pacing.    03/06/23 LAQ 10 x 5 second holds  Bridge 1x 10  Standing hip abduction 1 x 10 bilateral    PATIENT EDUCATION:  Education details: Patient educated on exam findings, POC, scope of PT, HEP. Person educated: Patient Education method: Explanation, Demonstration, and Handouts Education comprehension: verbalized understanding, returned demonstration, verbal cues required, and  tactile cues required  HOME EXERCISE PROGRAM: 04/08/23 scapular retractions and shoulder extensions  Access Code: [email or web address] URL: [email or web address]  Date: 03/06/2023 - Seated Long Arc Quad  - 2-3 x daily - 7 x weekly - 2 sets - 10 reps - 5 second hold - Supine Bridge  - 2-3 x daily - 7 x weekly - 2 sets - 10 reps - Standing Hip Abduction with Counter Support  - 2-3 x daily - 7 x weekly - 2 sets - 10 reps  Date: 03/20/2023 Prepared by: Starling Manns Exercises - Side Stepping with Resistance at Ankles and Counter Support  - 1 x daily - 7 x weekly - 3 sets - 10 reps - Squat with Chair Touch  - 1 x daily - 7 x weekly - 3 sets - 10 reps - Single Leg Stance with Support  - 1 x daily - 7 x weekly - 3 sets - 10 reps  Date: 04/03/2023 Prepared by: Emeline Gins Exercises - Standing Hip Extension with Counter Support  - 2-3 x daily - 7 x weekly - 2 sets - 10 reps - Standing Lumbar Extension  - 2-3 x daily - 7 x weekly - 2 sets - 10 reps - Standing 3-Way Kick  - 2-3 x daily - 7 x weekly - 2 sets - 10 reps - Squat with Chair Touch  - 2-3 x daily - 7 x weekly - 2 sets - 10 reps  ASSESSMENT: CLINICAL IMPRESSION: Continued with focus on lower extremity strengthening; core stability.  Added weighted walkouts to challenge balance and strength; added leg press for strength progression with good challenge.  Continues with slight rounded shoulders and forward head so added scapular retractions and shoulder extensions to promote postural strength; added to HEP and updated HEP .  Pt will continue to benefit from skilled physical therapy services to address functional deficits and improve overall safety with functional mobility.    OBJECTIVE IMPAIRMENTS: Abnormal gait, decreased activity tolerance, decreased balance, decreased endurance, decreased mobility, difficulty walking, decreased strength, impaired flexibility, and improper body mechanics.   ACTIVITY LIMITATIONS: carrying, lifting,  bending, standing, squatting, stairs, transfers, locomotion level, and caring for others  PARTICIPATION LIMITATIONS: meal prep, cleaning, laundry, shopping, community activity, occupation, and yard work  PERSONAL FACTORS: Fitness, Past/current experiences, Time since onset of injury/illness/exacerbation, and 3+ comorbidities: HTN, Schizophrenia, Falls  are also affecting patient's functional outcome.   REHAB POTENTIAL: Good  CLINICAL DECISION MAKING: Stable/uncomplicated  EVALUATION COMPLEXITY: Low   GOALS: Goals reviewed with patient? Yes  SHORT TERM GOALS: Target date: 03/27/2023    Patient will be independent with HEP in order to improve functional outcomes. Baseline: Goal status: IN PROGRESS  2.  Patient will report at least 25% improvement in symptoms for improved quality of life. Baseline: Goal status: IN PROGRESS   LONG TERM GOALS: Target date: 04/17/2023    Patient will report at least 75% improvement in symptoms for improved quality of life. Baseline:  Goal status:  IN PROGRESS  2.  Patient will improve DGI score by at least 5 points in order to indicate improved tolerance to activity. Baseline: 16/24 Goal status: IN PROGRESS  3.    Patient will demonstrate grade of 4+/5 MMT grade in all tested musculature as evidence of improved strength to assist with stair ambulation and gait.   Baseline: see above Goal status: IN PROGRESS  4.  Patient will be able to ambulate at least 375 feet in in order to demonstrate improved tolerance to activity. Baseline: 310 feet Goal status: IN PROGRESS  5.  Patient will be able to complete 5x STS in under 11.4 seconds in order to reduce the risk of falls. Baseline: 18.81 seconds Goal status: IN PROGRESS    PLAN:  PT FREQUENCY: 2x/week  PT DURATION: 6 weeks  PLANNED INTERVENTIONS: Therapeutic exercises, Therapeutic activity, Neuromuscular re-education, Balance training, Gait training, Patient/Family education, Joint  manipulation, Joint mobilization, Stair training, Orthotic/Fit training, DME instructions, Aquatic Therapy, Dry Needling, Electrical stimulation, Spinal manipulation, Spinal mobilization, Cryotherapy, Moist heat, Compression bandaging, scar mobilization, Splintting, Taping, Traction, Ultrasound, Ionotophoresis 4mg /ml Dexamethasone, and Manual therapy  PLAN FOR NEXT SESSION: Continue to work on functional strength, gait and balance training.    7:38 AM, 04/08/23 Durante Violett Small Analysa Nutting MPT [facility]  [phone]

## 2023-04-10 ENCOUNTER — Ambulatory Visit (HOSPITAL_COMMUNITY): Payer: 59 | Admitting: Physical Therapy

## 2023-04-10 DIAGNOSIS — R29898 Other symptoms and signs involving the musculoskeletal system: Secondary | ICD-10-CM

## 2023-04-10 DIAGNOSIS — R2689 Other abnormalities of gait and mobility: Secondary | ICD-10-CM | POA: Diagnosis not present

## 2023-04-10 DIAGNOSIS — M6281 Muscle weakness (generalized): Secondary | ICD-10-CM | POA: Diagnosis not present

## 2023-04-10 NOTE — Therapy (Signed)
OUTPATIENT PHYSICAL THERAPY TREATMENT   Patient Name: [name] [name] MRN: [id number] DOB:04-09-1961, 62 y.o., female Today's Date: 04/10/2023  END OF SESSION:  PT End of Session - 04/10/23 0915    Visit Number 10    Number of Visits 12    Date for PT Re-Evaluation 04/17/23    Authorization Type [employer] (no auth, no VL)    Progress Note Due on Visit 10    PT Start Time [id number]    PT Stop Time 0915    PT Time Calculation (min) 40 min    Equipment Utilized During Treatment Gait belt    Activity Tolerance Patient tolerated treatment well    Behavior During Therapy WFL for tasks assessed/performed                  Past Medical History:  Diagnosis Date   BV (bacterial vaginosis) 01/28/2013   Closed nondisplaced fracture of head of radius with routine healing 03/30/2019   Encounter for gynecological examination with Papanicolaou smear of cervix 01/17/2021   Encounter for screening fecal occult blood testing 01/17/2021   Essential hypertension, benign    Hemorrhoids    Obsessive-compulsive disorders    Postmenopausal bleeding    Prolonged Q-T interval on ECG 06/02/2012   Rectal bleeding 06/21/2015   Schizoaffective disorder (HCC)    Schizophrenia (HCC) 01/12/2021   Syncope 06/02/2012   Tubular adenoma    Past Surgical History:  Procedure Laterality Date   BUNIONECTOMY     COLONOSCOPY N/A 07/05/2015   Dr.Rourk- internal hemorrhoids, redundant colon, colionic dierticulosis, colonic polyp. bx= tubular adenoma. next tcs 06/2020.    COLONOSCOPY WITH PROPOFOL N/A 10/12/2020   one 3 mm polyp in the cecum (tubular adenoma), sigmoid and descending colon diverticulosis.    HEMORRHOID BANDING  2017   Dr.Rourk   POLYPECTOMY  10/12/2020   Procedure: POLYPECTOMY INTESTINAL;  Surgeon: Corbin Ade, MD;  Location: [facility];  Service: Endoscopy;;  cecal colon polyp;    Patient Active Problem List   Diagnosis Date Noted   Falls 01/28/2023   Frequent stools 11/28/2022   High  risk medication monitoring: divalproex 05/30/2022   Long term current use of antipsychotic medication 05/30/2022   Tremor 05/30/2022   Memory difficulties 05/30/2022   Internal hemorrhoids 07/25/2021   Prediabetes 01/13/2021   Morbid obesity (HCC) 01/13/2021   Primary insomnia 01/12/2021   Generalized anxiety disorder 01/12/2021   Mixed hyperlipidemia 01/07/2021   Hypothyroidism 01/07/2021   Schizoaffective disorder, depressive type (HCC) 05/08/2017   History of colonic polyps    Diverticulosis of colon without hemorrhage    Hemorrhoid    Family hx of colon cancer 06/21/2015   Postmenopausal bleeding 03/18/2013   BV (bacterial vaginosis) 01/28/2013   Murmur, cardiac 06/02/2012   Essential hypertension, benign 06/02/2012    PCP: Benita Stabile MD  REFERRING PROVIDER: Benita Stabile, MD  REFERRING DIAG: R26.89 (ICD-10-CM) - Other abnormalities of gait and mobility  THERAPY DIAG:  Other abnormalities of gait and mobility  Other symptoms and signs involving the musculoskeletal system  Muscle weakness (generalized)  Rationale for Evaluation and Treatment: Rehabilitation  ONSET DATE: 1 year  SUBJECTIVE:   SUBJECTIVE STATEMENT: PT states that she feels about 40% better.  Eval:Patient states she has had falls and some balance issues. She falls at work, at stores, and at home. She fell and had to get stitches. Some times legs feel like they want to give away.   PERTINENT HISTORY: HTN, Schizophrenia, Falls PAIN:  Are you having pain? No  PRECAUTIONS: Fall  WEIGHT BEARING RESTRICTIONS: No  FALLS:  Has patient fallen in last 6 months? Yes. Number of falls 3-4  OCCUPATION: works at [employer]   PLOF: Independent  PATIENT GOALS: see what her strengths are   OBJECTIVE:    COGNITION: Overall cognitive status: Within functional limits for tasks assessed     SENSATION: WFL  POSTURE: rounded shoulders and forward head   LOWER EXTREMITY MMT:  MMT Right eval 04/10/23  Left eval 04/10/23  Hip flexion 4 4 4 5   Hip extension 3- 3 3- 3+  Hip abduction 4- 4- 3+ 3+  Hip adduction      Hip internal rotation      Hip external rotation      Knee flexion 4 4 4 4   Knee extension 4+ 5 4+ 5  Ankle dorsiflexion 5  5   Ankle plantarflexion      Ankle inversion      Ankle eversion       (Blank rows = not tested)    FUNCTIONAL TESTS:  5 times sit to stand: 18.81 seconds without UE support 04/10/23: 5x sit to stand:  11.81 2 minute walk test: 310 feet 04/10/23 2 minute walk 428 ft  Dynamic Gait Index: 16/24   GAIT: Distance walked: eval:  310 feet  , 04/11/23: 428 ft in 2 minutes  Assistive device utilized: None Level of assistance: Complete Independence Comments: , antalgic on R but no c/o pain, decreased R hip extension and R ankle DF with R LE slightly Externally rotated  DGI 1. Gait level surface (2) Mild Impairment: Walks 20', uses assistive devices, slower speed, mild gait deviations. 2. Change in gait speed (2) Mild Impairment: Is able to change speed but demonstrates mild gait deviations, or not gait deviations but unable to achieve a significant change in velocity, or uses an assistive device. 3. Gait with horizontal head turns (2) Mild Impairment: Performs head turns smoothly with slight change in gait velocity, i.e., minor disruption to smooth gait path or uses walking aid. 4. Gait with vertical head turns (2) Mild Impairment: Performs head turns smoothly with slight change in gait velocity, i.e., minor disruption to smooth gait path or uses walking aid. 5. Gait and pivot turn (2) Mild Impairment: Pivot turns safely in > 3 seconds and stops with no loss of balance. 6. Step over obstacle (2) Mild Impairment: Is able to step over box, but must slow down and adjust steps to clear box safely. 7. Step around obstacles (2) Mild Impairment: Is able to step around both cones, but must slow down and adjust steps to clear cones. 8. Stairs (2) Mild  Impairment: Alternating feet, must use rail.  TOTAL SCORE: 16 / 24; not completed at reassess as pt wanted to get to church breakfast which started at 9:00 AM   TODAY'S TREATMENT:  DATE: 9/5/24Reassessed see above Sit to stand x 15 then x 10  Standing: Heel raise x 10 Squat x 10 Side step with blue theraband x 2 RT Tandem stance x 2 RT Tandem gait x 2 RT Single leg stance RT and Lt x 10 reps each  Marching with one hand hold x 10  Step up onto 6" step x 10 both RT and LT Step down 6" step x 10 RT and LT  Leg press 4 plates x 15      04/08/23 Nustep 5 minutes level 4 seat 5 [place] jog  Standing: Heel raises on incline x 20 Hip vectors 5" hold x 5 each Walkouts using pull bar retro 3 plates x 10 6" box alternating lunges Scapular retractions RTB 2 x 10 Shoulder extensions RTB 2 x 10  Leg press bodycraft 4 plates 2 x 10    04/03/23 Standing:  heelraises 20X for warmup  Hip abduction with GTB 2X10  Hip extension with GTB 2X10  Squats on blue foam pad with blue weighted ball 2X10  Tandem stance on aeromat with 1# bar UE lift 10X each LE leading  Side stepping with GTB on 20 foot line, cues for posturing 3RT  Retro ambulation on 20 Ft line 3RT  Vectors with 1 HHA 10X5" each LE  N[place]ep beach 5 minutes level 4 seat 5  04/01/23 Standing:  heelraises 20X for warmup  Squats on blue foam pad with blue weighted ball 2X10  Tandem stance with 1# bar UE lift 10X each LE leading  Side stepping with GTB on 10 foot line, cues for posturing 5RT  Retro ambulation on 20 Ft line 3RT  Hip abduction with GTB 2X10  Hip extension with GTB 2X10  Vectors with 1 HHA 10X5" each LE   PATIENT EDUCATION:  Education details: Patient educated on exam findings, POC, scope of PT, HEP. Person educated: Patient Education method: Explanation, Demonstration, and  Handouts Education comprehension: verbalized understanding, returned demonstration, verbal cues required, and tactile cues required  HOME EXERCISE PROGRAM: 04/08/23 scapular retractions and shoulder extensions  Access Code: [email or web address] URL: [email or web address]  Date: 03/06/2023 - Seated Long Arc Quad  - 2-3 x daily - 7 x weekly - 2 sets - 10 reps - 5 second hold - Supine Bridge  - 2-3 x daily - 7 x weekly - 2 sets - 10 reps - Standing Hip Abduction with Counter Support  - 2-3 x daily - 7 x weekly - 2 sets - 10 reps  Date: 03/20/2023 Prepared by: Starling Manns Exercises - Side Stepping with Resistance at Ankles and Counter Support  - 1 x daily - 7 x weekly - 3 sets - 10 reps - Squat with Chair Touch  - 1 x daily - 7 x weekly - 3 sets - 10 reps - Single Leg Stance with Support  - 1 x daily - 7 x weekly - 3 sets - 10 reps  Date: 04/03/2023 Prepared by: Emeline Gins Exercises - Standing Hip Extension with Counter Support  - 2-3 x daily - 7 x weekly - 2 sets - 10 reps - Standing Lumbar Extension  - 2-3 x daily - 7 x weekly - 2 sets - 10 reps - Standing 3-Way Kick  - 2-3 x daily - 7 x weekly - 2 sets - 10 reps - Squat with Chair Touch  - 2-3 x daily - 7 x weekly - 2 sets - 10 reps  ASSESSMENT: CLINICAL IMPRESSION: PT reassessed. She has  improved in strength in some areas but hip mm remain weak.  Pt showed functional improvement with better scores in 5x sit to stand and 2 minute walk test . Pt continues to have weakness and decreased balance but requests to be done with formal therapy next week stating that she will continue with her HEP.   Pt will continue to benefit from skilled physical therapy services to address functional deficits and improve overall safety with functional mobility.    OBJECTIVE IMPAIRMENTS: Abnormal gait, decreased activity tolerance, decreased balance, decreased endurance, decreased mobility, difficulty walking, decreased strength, impaired flexibility,  and improper body mechanics.   ACTIVITY LIMITATIONS: carrying, lifting, bending, standing, squatting, stairs, transfers, locomotion level, and caring for others  PARTICIPATION LIMITATIONS: meal prep, cleaning, laundry, shopping, community activity, occupation, and yard work  PERSONAL FACTORS: Fitness, Past/current experiences, Time since onset of injury/illness/exacerbation, and 3+ comorbidities: HTN, Schizophrenia, Falls  are also affecting patient's functional outcome.   REHAB POTENTIAL: Good  CLINICAL DECISION MAKING: Stable/uncomplicated  EVALUATION COMPLEXITY: Low   GOALS: Goals reviewed with patient? Yes  SHORT TERM GOALS: Target date: 03/27/2023    Patient will be independent with HEP in order to improve functional outcomes. Baseline: Goal status: IN PROGRESS  2.  Patient will report at least 25% improvement in symptoms for improved quality of life. Baseline: Goal status: MET   LONG TERM GOALS: Target date: 04/17/2023    Patient will report at least 75% improvement in symptoms for improved quality of life. Baseline:  Goal status: IN PROGRESS  2.  Patient will improve DGI score by at least 5 points in order to indicate improved tolerance to activity. Baseline: 16/24 Goal status: IN PROGRESS  3.    Patient will demonstrate grade of 4+/5 MMT grade in all tested musculature as evidence of improved strength to assist with stair ambulation and gait.   Baseline: see above Goal status: IN PROGRESS  4.  Patient will be able to ambulate at least 375 feet in in order to demonstrate improved tolerance to activity. Baseline: 310 feet Goal status: IN PROGRESS  5.  Patient will be able to complete 5x STS in under 11.4 seconds in order to reduce the risk of falls. Baseline: 18.81 seconds Goal status: IN PROGRESS    PLAN:  PT FREQUENCY: 2x/week  PT DURATION: 6 weeks  PLANNED INTERVENTIONS: Therapeutic exercises, Therapeutic activity, Neuromuscular re-education,  Balance training, Gait training, Patient/Family education, Joint manipulation, Joint mobilization, Stair training, Orthotic/Fit training, DME instructions, Aquatic Therapy, Dry Needling, Electrical stimulation, Spinal manipulation, Spinal mobilization, Cryotherapy, Moist heat, Compression bandaging, scar mobilization, Splintting, Taping, Traction, Ultrasound, Ionotophoresis 4mg /ml Dexamethasone, and Manual therapy  PLAN FOR NEXT SESSION: Focus on current deficits, advanced HEP with discharge next week.  Virgina Organ, PT CLT [phone]

## 2023-04-15 ENCOUNTER — Ambulatory Visit (HOSPITAL_COMMUNITY): Payer: 59

## 2023-04-15 DIAGNOSIS — R2689 Other abnormalities of gait and mobility: Secondary | ICD-10-CM | POA: Diagnosis not present

## 2023-04-15 DIAGNOSIS — M6281 Muscle weakness (generalized): Secondary | ICD-10-CM | POA: Diagnosis not present

## 2023-04-15 DIAGNOSIS — R29898 Other symptoms and signs involving the musculoskeletal system: Secondary | ICD-10-CM

## 2023-04-15 NOTE — Therapy (Signed)
OUTPATIENT PHYSICAL THERAPY TREATMENT   Patient Name: [name] [name] MRN: [id number] DOB:12-Aug-1960, 62 y.o., female Today's Date: 04/15/2023  END OF SESSION:   PT End of Session - 04/15/23 0754     Visit Number 11    Number of Visits 12    Date for PT Re-Evaluation 04/17/23    Authorization Type [employer] (no auth, no VL)    Progress Note Due on Visit 10    PT Start Time 0750   late arrival   PT Stop Time 0815    PT Time Calculation (min) 25 min    Equipment Utilized During Treatment Gait belt    Activity Tolerance Patient tolerated treatment well    Behavior During Therapy WFL for tasks assessed/performed                       Past Medical History:  Diagnosis Date   BV (bacterial vaginosis) 01/28/2013   Closed nondisplaced fracture of head of radius with routine healing 03/30/2019   Encounter for gynecological examination with Papanicolaou smear of cervix 01/17/2021   Encounter for screening fecal occult blood testing 01/17/2021   Essential hypertension, benign    Hemorrhoids    Obsessive-compulsive disorders    Postmenopausal bleeding    Prolonged Q-T interval on ECG 06/02/2012   Rectal bleeding 06/21/2015   Schizoaffective disorder (HCC)    Schizophrenia (HCC) 01/12/2021   Syncope 06/02/2012   Tubular adenoma    Past Surgical History:  Procedure Laterality Date   BUNIONECTOMY     COLONOSCOPY N/A 07/05/2015   Dr.Rourk- internal hemorrhoids, redundant colon, colionic dierticulosis, colonic polyp. bx= tubular adenoma. next tcs 06/2020.    COLONOSCOPY WITH PROPOFOL N/A 10/12/2020   one 3 mm polyp in the cecum (tubular adenoma), sigmoid and descending colon diverticulosis.    HEMORRHOID BANDING  2017   Dr.Rourk   POLYPECTOMY  10/12/2020   Procedure: POLYPECTOMY INTESTINAL;  Surgeon: Corbin Ade, MD;  Location: [facility];  Service: Endoscopy;;  cecal colon polyp;    Patient Active Problem List   Diagnosis Date Noted   Falls 01/28/2023   Frequent  stools 11/28/2022   High risk medication monitoring: divalproex 05/30/2022   Long term current use of antipsychotic medication 05/30/2022   Tremor 05/30/2022   Memory difficulties 05/30/2022   Internal hemorrhoids 07/25/2021   Prediabetes 01/13/2021   Morbid obesity (HCC) 01/13/2021   Primary insomnia 01/12/2021   Generalized anxiety disorder 01/12/2021   Mixed hyperlipidemia 01/07/2021   Hypothyroidism 01/07/2021   Schizoaffective disorder, depressive type (HCC) 05/08/2017   History of colonic polyps    Diverticulosis of colon without hemorrhage    Hemorrhoid    Family hx of colon cancer 06/21/2015   Postmenopausal bleeding 03/18/2013   BV (bacterial vaginosis) 01/28/2013   Murmur, cardiac 06/02/2012   Essential hypertension, benign 06/02/2012    PCP: Benita Stabile MD  REFERRING PROVIDER: Benita Stabile, MD  REFERRING DIAG: R26.89 (ICD-10-CM) - Other abnormalities of gait and mobility  THERAPY DIAG:  Other abnormalities of gait and mobility  Other symptoms and signs involving the musculoskeletal system  Muscle weakness (generalized)  Rationale for Evaluation and Treatment: Rehabilitation  ONSET DATE: 1 year  SUBJECTIVE:   SUBJECTIVE STATEMENT: Late arrival today; maybe "50%" better now  Eval:Patient states she has had falls and some balance issues. She falls at work, at stores, and at home. She fell and had to get stitches. Some times legs feel like they want to give  away.   PERTINENT HISTORY: HTN, Schizophrenia, Falls PAIN:  Are you having pain? No  PRECAUTIONS: Fall  WEIGHT BEARING RESTRICTIONS: No  FALLS:  Has patient fallen in last 6 months? Yes. Number of falls 3-4  OCCUPATION: works at [employer]   PLOF: Independent  PATIENT GOALS: see what her strengths are   OBJECTIVE:    COGNITION: Overall cognitive status: Within functional limits for tasks assessed     SENSATION: WFL  POSTURE: rounded shoulders and forward head   LOWER EXTREMITY  MMT:  MMT Right eval 04/10/23 Left eval 04/10/23  Hip flexion 4 4 4 5   Hip extension 3- 3 3- 3+  Hip abduction 4- 4- 3+ 3+  Hip adduction      Hip internal rotation      Hip external rotation      Knee flexion 4 4 4 4   Knee extension 4+ 5 4+ 5  Ankle dorsiflexion 5  5   Ankle plantarflexion      Ankle inversion      Ankle eversion       (Blank rows = not tested)    FUNCTIONAL TESTS:  5 times sit to stand: 18.81 seconds without UE support 04/10/23: 5x sit to stand:  11.81 2 minute walk test: 310 feet 04/10/23 2 minute walk 428 ft  Dynamic Gait Index: 16/24   GAIT: Distance walked: eval:  310 feet  , 04/11/23: 428 ft in 2 minutes  Assistive device utilized: None Level of assistance: Complete Independence Comments: , antalgic on R but no c/o pain, decreased R hip extension and R ankle DF with R LE slightly Externally rotated  DGI 1. Gait level surface (2) Mild Impairment: Walks 20', uses assistive devices, slower speed, mild gait deviations. 2. Change in gait speed (2) Mild Impairment: Is able to change speed but demonstrates mild gait deviations, or not gait deviations but unable to achieve a significant change in velocity, or uses an assistive device. 3. Gait with horizontal head turns (2) Mild Impairment: Performs head turns smoothly with slight change in gait velocity, i.e., minor disruption to smooth gait path or uses walking aid. 4. Gait with vertical head turns (2) Mild Impairment: Performs head turns smoothly with slight change in gait velocity, i.e., minor disruption to smooth gait path or uses walking aid. 5. Gait and pivot turn (2) Mild Impairment: Pivot turns safely in > 3 seconds and stops with no loss of balance. 6. Step over obstacle (2) Mild Impairment: Is able to step over box, but must slow down and adjust steps to clear box safely. 7. Step around obstacles (2) Mild Impairment: Is able to step around both cones, but must slow down and adjust steps to clear  cones. 8. Stairs (2) Mild Impairment: Alternating feet, must use rail.  TOTAL SCORE: 16 / 24; not completed at reassess as pt wanted to get to church breakfast which started at 9:00 AM   TODAY'S TREATMENT:  DATE: 04/15/23 Heel raises on incline x 20 Squats x 20 Tandem stance 2 x 30" each 8" box step ups x 10 SLS 3 x 10" Leg press 4 plates 2 x 10 Retro walkouts with 4 plates x 10 Marching x 10 with 1 UE assist  9/5/24Reassessed see above Sit to stand x 15 then x 10  Standing: Heel raise x 10 Squat x 10 Side step with blue theraband x 2 RT Tandem stance x 2 RT Tandem gait x 2 RT Single leg stance RT and Lt x 10 reps each  Marching with one hand hold x 10  Step up onto 6" step x 10 both RT and LT Step down 6" step x 10 RT and LT  Leg press 4 plates x 15      04/08/23 Nustep 5 minutes level 4 seat 5 [place] jog  Standing: Heel raises on incline x 20 Hip vectors 5" hold x 5 each Walkouts using pull bar retro 3 plates x 10 6" box alternating lunges Scapular retractions RTB 2 x 10 Shoulder extensions RTB 2 x 10  Leg press bodycraft 4 plates 2 x 10    04/03/23 Standing:  heelraises 20X for warmup  Hip abduction with GTB 2X10  Hip extension with GTB 2X10  Squats on blue foam pad with blue weighted ball 2X10  Tandem stance on aeromat with 1# bar UE lift 10X each LE leading  Side stepping with GTB on 20 foot line, cues for posturing 3RT  Retro ambulation on 20 Ft line 3RT  Vectors with 1 HHA 10X5" each LE  [place] 5 minutes level 4 seat 5  04/01/23 Standing:  heelraises 20X for warmup  Squats on blue foam pad with blue weighted ball 2X10  Tandem stance with 1# bar UE lift 10X each LE leading  Side stepping with GTB on 10 foot line, cues for posturing 5RT  Retro ambulation on 20 Ft line 3RT  Hip abduction with GTB 2X10  Hip  extension with GTB 2X10  Vectors with 1 HHA 10X5" each LE   PATIENT EDUCATION:  Education details: Patient educated on exam findings, POC, scope of PT, HEP. Person educated: Patient Education method: Explanation, Demonstration, and Handouts Education comprehension: verbalized understanding, returned demonstration, verbal cues required, and tactile cues required  HOME EXERCISE PROGRAM: 04/15/23 tandem stance 04/08/23 scapular retractions and shoulder extensions  Access Code: [email or web address] DRFE URL: [email or web address]  Date: 03/06/2023 - Seated Long Arc Quad  - 2-3 x daily - 7 x weekly - 2 sets - 10 reps - 5 second hold - Supine Bridge  - 2-3 x daily - 7 x weekly - 2 sets - 10 reps - Standing Hip Abduction with Counter Support  - 2-3 x daily - 7 x weekly - 2 sets - 10 reps  Date: 03/20/2023 Prepared by: Starling Manns Exercises - Side Stepping with Resistance at Ankles and Counter Support  - 1 x daily - 7 x weekly - 3 sets - 10 reps - Squat with Chair Touch  - 1 x daily - 7 x weekly - 3 sets - 10 reps - Single Leg Stance with Support  - 1 x daily - 7 x weekly - 3 sets - 10 reps  Date: 04/03/2023 Prepared by: Emeline Gins Exercises - Standing Hip Extension with Counter Support  - 2-3 x daily - 7 x weekly - 2 sets - 10 reps - Standing Lumbar Extension  - 2-3 x daily - 7 x weekly - 2  sets - 10 reps - Standing 3-Way Kick  - 2-3 x daily - 7 x weekly - 2 sets - 10 reps - Squat with Chair Touch  - 2-3 x daily - 7 x weekly - 2 sets - 10 reps  ASSESSMENT: CLINICAL IMPRESSION: Later arrival today.  Continued to address lower extremity and functional strengthening and balance.  Patient continues with challenge with tandem stance. Added to HEP.  Increased box height  with step ups with good challenge.   Increased plates with walkouts without issue.   Pt will continue to benefit from skilled physical therapy services to address functional deficits and improve overall safety with functional  mobility.    OBJECTIVE IMPAIRMENTS: Abnormal gait, decreased activity tolerance, decreased balance, decreased endurance, decreased mobility, difficulty walking, decreased strength, impaired flexibility, and improper body mechanics.   ACTIVITY LIMITATIONS: carrying, lifting, bending, standing, squatting, stairs, transfers, locomotion level, and caring for others  PARTICIPATION LIMITATIONS: meal prep, cleaning, laundry, shopping, community activity, occupation, and yard work  PERSONAL FACTORS: Fitness, Past/current experiences, Time since onset of injury/illness/exacerbation, and 3+ comorbidities: HTN, Schizophrenia, Falls  are also affecting patient's functional outcome.   REHAB POTENTIAL: Good  CLINICAL DECISION MAKING: Stable/uncomplicated  EVALUATION COMPLEXITY: Low   GOALS: Goals reviewed with patient? Yes  SHORT TERM GOALS: Target date: 03/27/2023    Patient will be independent with HEP in order to improve functional outcomes. Baseline: Goal status: IN PROGRESS  2.  Patient will report at least 25% improvement in symptoms for improved quality of life. Baseline: Goal status: MET   LONG TERM GOALS: Target date: 04/17/2023    Patient will report at least 75% improvement in symptoms for improved quality of life. Baseline:  Goal status: IN PROGRESS  2.  Patient will improve DGI score by at least 5 points in order to indicate improved tolerance to activity. Baseline: 16/24 Goal status: IN PROGRESS  3.    Patient will demonstrate grade of 4+/5 MMT grade in all tested musculature as evidence of improved strength to assist with stair ambulation and gait.   Baseline: see above Goal status: IN PROGRESS  4.  Patient will be able to ambulate at least 375 feet in in order to demonstrate improved tolerance to activity. Baseline: 310 feet Goal status: IN PROGRESS  5.  Patient will be able to complete 5x STS in under 11.4 seconds in order to reduce the risk of  falls. Baseline: 18.81 seconds Goal status: IN PROGRESS    PLAN:  PT FREQUENCY: 2x/week  PT DURATION: 6 weeks  PLANNED INTERVENTIONS: Therapeutic exercises, Therapeutic activity, Neuromuscular re-education, Balance training, Gait training, Patient/Family education, Joint manipulation, Joint mobilization, Stair training, Orthotic/Fit training, DME instructions, Aquatic Therapy, Dry Needling, Electrical stimulation, Spinal manipulation, Spinal mobilization, Cryotherapy, Moist heat, Compression bandaging, scar mobilization, Splintting, Taping, Traction, Ultrasound, Ionotophoresis 4mg /ml Dexamethasone, and Manual therapy  PLAN FOR NEXT SESSION: Focus on current deficits, advanced HEP with discharge next visit  8:13 AM, 04/15/23 [place] Gilpatrick Small Iviana Blasingame MPT [facility] [place] [phone] Ph:[phone]

## 2023-04-17 ENCOUNTER — Ambulatory Visit (HOSPITAL_COMMUNITY): Payer: 59 | Admitting: Psychiatry

## 2023-04-17 ENCOUNTER — Ambulatory Visit (HOSPITAL_COMMUNITY): Payer: 59

## 2023-04-17 DIAGNOSIS — R29898 Other symptoms and signs involving the musculoskeletal system: Secondary | ICD-10-CM

## 2023-04-17 DIAGNOSIS — R2689 Other abnormalities of gait and mobility: Secondary | ICD-10-CM | POA: Diagnosis not present

## 2023-04-17 DIAGNOSIS — F251 Schizoaffective disorder, depressive type: Secondary | ICD-10-CM

## 2023-04-17 DIAGNOSIS — M6281 Muscle weakness (generalized): Secondary | ICD-10-CM

## 2023-04-17 NOTE — Therapy (Signed)
OUTPATIENT PHYSICAL THERAPY TREATMENT PHYSICAL THERAPY DISCHARGE SUMMARY  Visits from Start of Care: 12  Current functional level related to goals / functional outcomes: See below   Remaining deficits: See below   Education / Equipment: See below   Patient agrees to discharge. Patient goals were partially met. Patient is being discharged due to being pleased with the current functional level.    Patient Name: [name] [name] MRN: [id number] DOB:March 06, 1961, 62 y.o., female Today's Date: 04/17/2023  END OF SESSION:   PT End of Session - 04/17/23 0743     Visit Number 12    Number of Visits 12    Date for PT Re-Evaluation 04/17/23    Authorization Type [employer] (no auth, no VL)    Progress Note Due on Visit 10    PT Start Time [phone]    PT Stop Time 0815    PT Time Calculation (min) 38 min    Equipment Utilized During Treatment Gait belt    Activity Tolerance Patient tolerated treatment well    Behavior During Therapy WFL for tasks assessed/performed                       Past Medical History:  Diagnosis Date   BV (bacterial vaginosis) 01/28/2013   Closed nondisplaced fracture of head of radius with routine healing 03/30/2019   Encounter for gynecological examination with Papanicolaou smear of cervix 01/17/2021   Encounter for screening fecal occult blood testing 01/17/2021   Essential hypertension, benign    Hemorrhoids    Obsessive-compulsive disorders    Postmenopausal bleeding    Prolonged Q-T interval on ECG 06/02/2012   Rectal bleeding 06/21/2015   Schizoaffective disorder (HCC)    Schizophrenia (HCC) 01/12/2021   Syncope 06/02/2012   Tubular adenoma    Past Surgical History:  Procedure Laterality Date   BUNIONECTOMY     COLONOSCOPY N/A 07/05/2015   Dr.Rourk- internal hemorrhoids, redundant colon, colionic dierticulosis, colonic polyp. bx= tubular adenoma. next tcs 06/2020.    COLONOSCOPY WITH PROPOFOL N/A 10/12/2020   one 3 mm polyp in the cecum  (tubular adenoma), sigmoid and descending colon diverticulosis.    HEMORRHOID BANDING  2017   Dr.Rourk   POLYPECTOMY  10/12/2020   Procedure: POLYPECTOMY INTESTINAL;  Surgeon: Corbin Ade, MD;  Location: [facility];  Service: Endoscopy;;  cecal colon polyp;    Patient Active Problem List   Diagnosis Date Noted   Falls 01/28/2023   Frequent stools 11/28/2022   High risk medication monitoring: divalproex 05/30/2022   Long term current use of antipsychotic medication 05/30/2022   Tremor 05/30/2022   Memory difficulties 05/30/2022   Internal hemorrhoids 07/25/2021   Prediabetes 01/13/2021   Morbid obesity (HCC) 01/13/2021   Primary insomnia 01/12/2021   Generalized anxiety disorder 01/12/2021   Mixed hyperlipidemia 01/07/2021   Hypothyroidism 01/07/2021   Schizoaffective disorder, depressive type (HCC) 05/08/2017   History of colonic polyps    Diverticulosis of colon without hemorrhage    Hemorrhoid    Family hx of colon cancer 06/21/2015   Postmenopausal bleeding 03/18/2013   BV (bacterial vaginosis) 01/28/2013   Murmur, cardiac 06/02/2012   Essential hypertension, benign 06/02/2012    PCP: Benita Stabile MD  REFERRING PROVIDER: Benita Stabile, MD  REFERRING DIAG: R26.89 (ICD-10-CM) - Other abnormalities of gait and mobility  THERAPY DIAG:  Other abnormalities of gait and mobility  Other symptoms and signs involving the musculoskeletal system  Muscle weakness (generalized)  Rationale for  Evaluation and Treatment: Rehabilitation  ONSET DATE: 1 year  SUBJECTIVE:   SUBJECTIVE STATEMENT: maybe "55%" better    Eval:Patient states she has had falls and some balance issues. She falls at work, at stores, and at home. She fell and had to get stitches. Some times legs feel like they want to give away.   PERTINENT HISTORY: HTN, Schizophrenia, Falls PAIN:  Are you having pain? No  PRECAUTIONS: Fall  WEIGHT BEARING RESTRICTIONS: No  FALLS:  Has patient fallen in  last 6 months? Yes. Number of falls 3-4  OCCUPATION: works at [employer]   PLOF: Independent  PATIENT GOALS: see what her strengths are   OBJECTIVE:    COGNITION: Overall cognitive status: Within functional limits for tasks assessed     SENSATION: WFL  POSTURE: rounded shoulders and forward head   LOWER EXTREMITY MMT:  MMT Right eval 04/10/23 Right 9/12 Left eval 04/10/23 left 9/12  Hip flexion 4 4 5 4 5 5   Hip extension 3- 3 3+ 3- 3+ 4  Hip abduction 4- 4- 4 3+ 3+ 4-  Hip adduction        Hip internal rotation        Hip external rotation        Knee flexion 4 4 4+ 4 4 4+  Knee extension 4+ 5 5 4+ 5 5  Ankle dorsiflexion 5  5 5  5   Ankle plantarflexion        Ankle inversion        Ankle eversion         (Blank rows = not tested)    FUNCTIONAL TESTS:  5 times sit to stand: 18.81 seconds without UE support 04/10/23: 5x sit to stand:  11.81 2 minute walk test: 310 feet 04/10/23 2 minute walk 428 ft  Dynamic Gait Index: 16/24   GAIT: Distance walked: eval:  310 feet  , 04/11/23: 428 ft in 2 minutes  Assistive device utilized: None Level of assistance: Complete Independence Comments: , antalgic on R but no c/o pain, decreased R hip extension and R ankle DF with R LE slightly Externally rotated  DGI 1. Gait level surface (2) Mild Impairment: Walks 20', uses assistive devices, slower speed, mild gait deviations. 2. Change in gait speed (2) Mild Impairment: Is able to change speed but demonstrates mild gait deviations, or not gait deviations but unable to achieve a significant change in velocity, or uses an assistive device. 3. Gait with horizontal head turns (2) Mild Impairment: Performs head turns smoothly with slight change in gait velocity, i.e., minor disruption to smooth gait path or uses walking aid. 4. Gait with vertical head turns (2) Mild Impairment: Performs head turns smoothly with slight change in gait velocity, i.e., minor disruption to smooth gait  path or uses walking aid. 5. Gait and pivot turn (2) Mild Impairment: Pivot turns safely in > 3 seconds and stops with no loss of balance. 6. Step over obstacle (2) Mild Impairment: Is able to step over box, but must slow down and adjust steps to clear box safely. 7. Step around obstacles (2) Mild Impairment: Is able to step around both cones, but must slow down and adjust steps to clear cones. 8. Stairs (2) Mild Impairment: Alternating feet, must use rail.  TOTAL SCORE: 16 / 24; not completed at reassess as pt wanted to get to church breakfast which started at 9:00 AM   TODAY'S TREATMENT:  DATE: 04/17/23 Discharge note Heel raises x 20 Squats x 20 Tandem stance 3 x 20" Vectors 2" hold x 5 each Nustep seat 5 x 5' beach run Leg press 4 plates 2 x 10 MMT's  5 times sit to stand 10.45 sec no UE assist DGI 1. Gait level surface (3) Normal: Walks 20', no assistive devices, good sped, no evidence for imbalance, normal gait pattern 2. Change in gait speed (3) Normal: Able to smoothly change walking speed without loss of balance or gait deviation. Shows a significant difference in walking speeds between normal, fast and slow speeds. 3. Gait with horizontal head turns (2) Mild Impairment: Performs head turns smoothly with slight change in gait velocity, i.e., minor disruption to smooth gait path or uses walking aid. 4. Gait with vertical head turns (3) Normal: Performs head turns smoothly with no change in gait. 5. Gait and pivot turn (3) Normal: Pivot turns safely within 3 seconds and stops quickly with no loss of balance. 6. Step over obstacle (3) Normal: Is able to step over the box without changing gait speed, no evidence of imbalance. 7. Step around obstacles (3) Normal: Is able to walk around cones safely without changing gait speed; no evidence of  imbalance. 8. Stairs (2) Mild Impairment: Alternating feet, must use rail.  TOTAL SCORE: 22 / 24  04/15/23 Heel raises on incline x 20 Squats x 20 Tandem stance 2 x 30" each 8" box step ups x 10 SLS 3 x 10" Leg press 4 plates 2 x 10 Retro walkouts with 4 plates x 10 Marching x 10 with 1 UE assist  9/5/24Reassessed see above Sit to stand x 15 then x 10  Standing: Heel raise x 10 Squat x 10 Side step with blue theraband x 2 RT Tandem stance x 2 RT Tandem gait x 2 RT Single leg stance RT and Lt x 10 reps each  Marching with one hand hold x 10  Step up onto 6" step x 10 both RT and LT Step down 6" step x 10 RT and LT  Leg press 4 plates x 15      04/08/23 Nustep 5 minutes level 4 seat 5 [place] jog  Standing: Heel raises on incline x 20 Hip vectors 5" hold x 5 each Walkouts using pull bar retro 3 plates x 10 6" box alternating lunges Scapular retractions RTB 2 x 10 Shoulder extensions RTB 2 x 10  Leg press bodycraft 4 plates 2 x 10    04/03/23 Standing:  heelraises 20X for warmup  Hip abduction with GTB 2X10  Hip extension with GTB 2X10  Squats on blue foam pad with blue weighted ball 2X10  Tandem stance on aeromat with 1# bar UE lift 10X each LE leading  Side stepping with GTB on 20 foot line, cues for posturing 3RT  Retro ambulation on 20 Ft line 3RT  Vectors with 1 HHA 10X5" each LE  Nustep beach 5 minutes level 4 seat 5  04/01/23 Standing:  heelraises 20X for warmup  Squats on blue foam pad with blue weighted ball 2X10  Tandem stance with 1# bar UE lift 10X each LE leading  Side stepping with GTB on 10 foot line, cues for posturing 5RT  Retro ambulation on 20 Ft line 3RT  Hip abduction with GTB 2X10  Hip extension with GTB 2X10  Vectors with 1 HHA 10X5" each LE   PATIENT EDUCATION:  Education details: Patient educated on exam findings, POC, scope of PT,  HEP. Person educated: Patient Education method: Explanation, Demonstration, and  Handouts Education comprehension: verbalized understanding, returned demonstration, verbal cues required, and tactile cues required  HOME EXERCISE PROGRAM: 04/15/23 tandem stance 04/08/23 scapular retractions and shoulder extensions  Access Code: [email or web address] URL: [email or web address]  Date: 03/06/2023 - Seated Long Arc Quad  - 2-3 x daily - 7 x weekly - 2 sets - 10 reps - 5 second hold - Supine Bridge  - 2-3 x daily - 7 x weekly - 2 sets - 10 reps - Standing Hip Abduction with Counter Support  - 2-3 x daily - 7 x weekly - 2 sets - 10 reps  Date: 03/20/2023 Prepared by: Starling Manns Exercises - Side Stepping with Resistance at Ankles and Counter Support  - 1 x daily - 7 x weekly - 3 sets - 10 reps - Squat with Chair Touch  - 1 x daily - 7 x weekly - 3 sets - 10 reps - Single Leg Stance with Support  - 1 x daily - 7 x weekly - 3 sets - 10 reps  Date: 04/03/2023 Prepared by: Emeline Gins Exercises - Standing Hip Extension with Counter Support  - 2-3 x daily - 7 x weekly - 2 sets - 10 reps - Standing Lumbar Extension  - 2-3 x daily - 7 x weekly - 2 sets - 10 reps - Standing 3-Way Kick  - 2-3 x daily - 7 x weekly - 2 sets - 10 reps - Squat with Chair Touch  - 2-3 x daily - 7 x weekly - 2 sets - 10 reps  ASSESSMENT: CLINICAL IMPRESSION: Discharge visit today.  Good improvement with all functional tests and strength.  Patient has met 2/2 short term goals and 3/5 long term goals.  She is pleased with her current status and is agreeable to discharge at this time.  OBJECTIVE IMPAIRMENTS: Abnormal gait, decreased activity tolerance, decreased balance, decreased endurance, decreased mobility, difficulty walking, decreased strength, impaired flexibility, and improper body mechanics.   ACTIVITY LIMITATIONS: carrying, lifting, bending, standing, squatting, stairs, transfers, locomotion level, and caring for others  PARTICIPATION LIMITATIONS: meal prep, cleaning, laundry, shopping,  community activity, occupation, and yard work  PERSONAL FACTORS: Fitness, Past/current experiences, Time since onset of injury/illness/exacerbation, and 3+ comorbidities: HTN, Schizophrenia, Falls  are also affecting patient's functional outcome.   REHAB POTENTIAL: Good  CLINICAL DECISION MAKING: Stable/uncomplicated  EVALUATION COMPLEXITY: Low   GOALS: Goals reviewed with patient? Yes  SHORT TERM GOALS: Target date: 03/27/2023    Patient will be independent with HEP in order to improve functional outcomes. Baseline: Goal status: met    2.  Patient will report at least 25% improvement in symptoms for improved quality of life. Baseline: Goal status: MET   LONG TERM GOALS: Target date: 04/17/2023    Patient will report at least 75% improvement in symptoms for improved quality of life. Baseline: "55%" better Goal status: IN PROGRESS  2.  Patient will improve DGI score by at least 5 points in order to indicate improved tolerance to activity. Baseline: 16/24; 22/24 9/12 Goal status: met  3.    Patient will demonstrate grade of 4+/5 MMT grade in all tested musculature as evidence of improved strength to assist with stair ambulation and gait.   Baseline: see above Goal status: partially met  4.  Patient will be able to ambulate at least 375 feet in in order to demonstrate improved tolerance to activity. Baseline: 310 feet; 428 ft 9/5 Goal status: met  5.  Patient will be able to complete 5x STS in under 11.4 seconds in order to reduce the risk of falls. Baseline: 18.81 seconds; 9/5 11.8; 9/12 10.45 Goal status: met    PLAN:  PT FREQUENCY: 2x/week  PT DURATION: 6 weeks  PLANNED INTERVENTIONS: Therapeutic exercises, Therapeutic activity, Neuromuscular re-education, Balance training, Gait training, Patient/Family education, Joint manipulation, Joint mobilization, Stair training, Orthotic/Fit training, DME instructions, Aquatic Therapy, Dry Needling, Electrical  stimulation, Spinal manipulation, Spinal mobilization, Cryotherapy, Moist heat, Compression bandaging, scar mobilization, Splintting, Taping, Traction, Ultrasound, Ionotophoresis 4mg /ml Dexamethasone, and Manual therapy  PLAN FOR NEXT SESSION: discharge 7:44 AM, 04/17/23 Takeesha Isley Small Ronalee Scheunemann MPT [place] [facility] [place] [phone] [phone]

## 2023-04-17 NOTE — Progress Notes (Signed)
IN- PERSON THERAPIST PROGRESS NOTE              Session Time:  Thursday  04/17/2023 11:10 AM -  11:52 AM           Participation Level: Active  Behavioral Response: lethargic  Type of Therapy: Individual Therapy  Treatment Goals addressed: Dannie will score less than 10 on the patient health questionnaire  Progress on goals: Progressing  Interventions: Supportive/CBT  Summary: [name] [name] is a 62 y.o. female who is a returning patient to this clinician. She  presents with a long-standing history of symptoms of anxiety and depression along with a previous diagnosis of schizoaffective disorder. She  reports a history of at least 3 psychiatric hospitalizations from 19 through 1990.   She has received outpatient services from [facility] and [facility].     Patient last was seen via virtual visit about 2 - 3 weeks ago. Patient reports decreased stress and feeling better since last session.  She is pleased with her improved efforts to set and maintain limits with one of her cousins and another acquaintance.  She has resumed contact with another cousin and reports difficulty setting limits as she fears hurting his feelings.  Patient reports increased behavioral activation socializing with family and friends and is pleased with doing more things for self like shopping.    Suicidal/Homicidal: Nowithout intent/plan patient agrees to call this practice, call 911, have someone take her to the ED should symptoms worsen.  Therapist Response:  reviewed symptoms, discussed stressors, facilitated expression of thoughts and feelings, validated feelings, praised and reinforced patient's use of assertiveness skills and efforts to set/maintain limits, praised and reinforced patient's continued involvement in activity, began to review basic personal rights to assist patient identify statements to promote more effective assertion, assisted patient identify ways to set and maintain limits with her cousin,  developed plan with patient to utilize strategies discussed in session Plan: Return in 3-4 weeks        Diagnosis: Axis I: Schizoaffective Disorder     Collaboration of Care: Psychiatrist AEB patient sees psychiatrist Dr. Adrian Blackwater  Patient/Guardian was advised Release of Information must be obtained prior to any record release in order to collaborate their care with an outside provider. Patient/Guardian was advised if they have not already done so to contact the registration department to sign all necessary forms in order for [name] to release information regarding their care.   Consent: Patient/Guardian gives verbal consent for treatment and assignment of benefits for services provided during this visit. Patient/Guardian expressed understanding and agreed to proceed.    Adah Salvage, LCSW 04/17/2023

## 2023-04-21 ENCOUNTER — Ambulatory Visit (HOSPITAL_COMMUNITY)
Admission: RE | Admit: 2023-04-21 | Discharge: 2023-04-21 | Disposition: A | Discharge status: Home / Self Care | Payer: 59 | Source: Ambulatory Visit | Attending: Internal Medicine | Admitting: Internal Medicine

## 2023-04-21 DIAGNOSIS — Z1231 Encounter for screening mammogram for malignant neoplasm of breast: Secondary | ICD-10-CM | POA: Insufficient documentation

## 2023-04-22 ENCOUNTER — Encounter (HOSPITAL_COMMUNITY): Payer: 59

## 2023-04-24 ENCOUNTER — Encounter (HOSPITAL_COMMUNITY): Payer: 59

## 2023-04-28 DIAGNOSIS — M545 Low back pain, unspecified: Secondary | ICD-10-CM | POA: Diagnosis not present

## 2023-04-29 ENCOUNTER — Encounter (HOSPITAL_COMMUNITY): Payer: 59 | Admitting: Physical Therapy

## 2023-05-01 ENCOUNTER — Encounter (HOSPITAL_COMMUNITY): Payer: 59

## 2023-05-06 ENCOUNTER — Encounter (HOSPITAL_COMMUNITY): Payer: 59

## 2023-05-08 ENCOUNTER — Encounter (HOSPITAL_COMMUNITY): Payer: 59

## 2023-05-09 ENCOUNTER — Other Ambulatory Visit (HOSPITAL_COMMUNITY): Payer: Self-pay | Admitting: Family Medicine

## 2023-05-09 ENCOUNTER — Ambulatory Visit (HOSPITAL_COMMUNITY)
Admission: RE | Admit: 2023-05-09 | Discharge: 2023-05-09 | Disposition: A | Discharge status: Home / Self Care | Payer: 59 | Source: Ambulatory Visit | Attending: Family Medicine | Admitting: Family Medicine

## 2023-05-09 DIAGNOSIS — M545 Low back pain, unspecified: Secondary | ICD-10-CM

## 2023-05-09 DIAGNOSIS — M5126 Other intervertebral disc displacement, lumbar region: Secondary | ICD-10-CM | POA: Diagnosis not present

## 2023-05-09 DIAGNOSIS — M47816 Spondylosis without myelopathy or radiculopathy, lumbar region: Secondary | ICD-10-CM | POA: Diagnosis not present

## 2023-05-22 ENCOUNTER — Ambulatory Visit (HOSPITAL_COMMUNITY): Payer: 59 | Admitting: Psychiatry

## 2023-05-22 DIAGNOSIS — F251 Schizoaffective disorder, depressive type: Secondary | ICD-10-CM

## 2023-05-22 NOTE — Progress Notes (Signed)
IN- PERSON THERAPIST PROGRESS NOTE              Session Time:  Thursday  05/22/2023 11:15 AM - 11:55 AM            Participation Level: Active  Behavioral Response: lethargic  Type of Therapy: Individual Therapy  Treatment Goals addressed: [name] will score less than 10 on the patient health questionnaire  Progress on goals: Progressing  Interventions: Supportive/CBT  Summary: [name] [name] is a 62 y.o. female who is a returning patient to this clinician. She  presents with a long-standing history of symptoms of anxiety and depression along with a previous diagnosis of schizoaffective disorder. She  reports a history of at least 3 psychiatric hospitalizations from 68 through 1990.   She has received outpatient services from [facility] and [facility].     Patient last was seen via virtual visit about 3-4 weeks ago.  Patient states feeling down during the past 2 weeks. She reports having thoughts of being better off dead triggered by not having a husband or children. She denies any plan or intent to harm self. She reports having these thoughts after interaction with cousin who was experiencing mental health crisis at the time. Pt was able to disengage from the situation and is pleased cousin now is receiving professional help. She reports additional stress regarding interaction with her boss. Pt made negative assumptions about the interaction. She then began wanting to quit her job but later talked with her main supervisor who encouraged pt not to quit job.  Patient admits she forgot to take her medication a couple of days.  Pt continues to socialize with family and friends.   Suicidal/Homicidal: Nowithout intent/plan patient agrees to call this practice, call 911, have someone take her to the ED should symptoms worsen.  Therapist Response:  reviewed symptoms, discussed stressors, facilitated expression of thoughts and feelings, validated feelings, praised and reinforced patient's use  of skills to disengage from the situation regarding her cousin, assisted patient identify realistic expectations of self regarding supporting cousin, assisted patient identify ways to improve medication compliance, assisted patient identify possible effects of not taking medication on interaction and thoughts about her boss, assisted patient identify and challenge negative assumptions, assisted patient generate alternative appraisal, assisted patient identify people in her support system to challenge thoughts of not having anyone in her life,   Plan: Return in 3-4 weeks        Diagnosis: Axis I: Schizoaffective Disorder     Collaboration of Care: Psychiatrist AEB patient sees psychiatrist Dr. Adrian Blackwater  Patient/Guardian was advised Release of Information must be obtained prior to any record release in order to collaborate their care with an outside provider. Patient/Guardian was advised if they have not already done so to contact the registration department to sign all necessary forms in order for [name] to release information regarding their care.   Consent: Patient/Guardian gives verbal consent for treatment and assignment of benefits for services provided during this visit. Patient/Guardian expressed understanding and agreed to proceed.    Adah Salvage, LCSW 05/22/2023

## 2023-05-27 ENCOUNTER — Other Ambulatory Visit (HOSPITAL_COMMUNITY): Payer: Self-pay | Admitting: Psychiatry

## 2023-05-27 ENCOUNTER — Telehealth (HOSPITAL_COMMUNITY): Payer: 59 | Admitting: Psychiatry

## 2023-05-27 ENCOUNTER — Encounter (HOSPITAL_COMMUNITY): Payer: Self-pay | Admitting: Psychiatry

## 2023-05-27 DIAGNOSIS — Z5181 Encounter for therapeutic drug level monitoring: Secondary | ICD-10-CM

## 2023-05-27 DIAGNOSIS — Z79899 Other long term (current) drug therapy: Secondary | ICD-10-CM

## 2023-05-27 DIAGNOSIS — F251 Schizoaffective disorder, depressive type: Secondary | ICD-10-CM

## 2023-05-27 DIAGNOSIS — F411 Generalized anxiety disorder: Secondary | ICD-10-CM

## 2023-05-27 NOTE — Patient Instructions (Signed)
We did not make any medication changes today.  Using a pillbox and gave good way to remember to take medications each day.
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
## 2023-06-05 ENCOUNTER — Ambulatory Visit (HOSPITAL_COMMUNITY): Payer: 59 | Admitting: Psychiatry

## 2023-06-05 DIAGNOSIS — F251 Schizoaffective disorder, depressive type: Secondary | ICD-10-CM | POA: Diagnosis not present
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
## 2023-06-18 ENCOUNTER — Encounter: Payer: Self-pay | Admitting: Orthopedic Surgery

## 2023-06-18 ENCOUNTER — Ambulatory Visit (INDEPENDENT_AMBULATORY_CARE_PROVIDER_SITE_OTHER): Payer: 59 | Admitting: Orthopedic Surgery

## 2023-06-18 VITALS — BP 113/64 | HR 61 | Ht 61.0 in | Wt 194.0 lb

## 2023-06-18 DIAGNOSIS — M545 Low back pain, unspecified: Secondary | ICD-10-CM

## 2023-06-18 NOTE — Progress Notes (Signed)
New Patient Visit  Assessment: [name] [name] is a 62 y.o. female with the following: 1. Lumbar back pain  Plan: [name] [name] has primarily axial low back pain.  No radiating symptoms.  No numbness or tingling.  This has been ongoing for couple of months.  Radiographs are without acute injury.  Mild degenerative changes overall.  We discussed medications, activity modifications, increasing aerobic activity and continuing with specific activities for her lower back.  I provided her with some home exercises.  We discussed warning signs, and all questions have been answered.  She will return to clinic as needed.  Follow-up: Return if symptoms worsen or fail to improve.  Subjective:  Chief Complaint  Patient presents with   Back Pain    LBP since Aug. Pt states she did have a fall in Jul that she thinks may have caused the pain.     History of Present Illness: [name] [name] is a 62 y.o. female who has been referred by Leone Payor, FNP for evaluation of lower back pain.  She states she has had low back pain for a couple of months.  She had a fall in July, and thinks this may have aggravated her pain.  Since then, the pain has remained constant.  She states it gets as bad as an 8/10.  She is taking over-the-counter pain medications, with some improvement in her symptoms.  She has been doing some exercises on her own.  She denies radiating symptoms into her legs.  No radiating pains.  No numbness or tingling.  No issues with bowel or bladder function.   Review of Systems: No fevers or chills No numbness or tingling No chest pain No shortness of breath No bowel or bladder dysfunction No GI distress No headaches   Medical History:  Past Medical History:  Diagnosis Date   BV (bacterial vaginosis) 01/28/2013   Closed nondisplaced fracture of head of radius with routine healing 03/30/2019   Encounter for gynecological examination with Papanicolaou smear of cervix 01/17/2021    Encounter for screening fecal occult blood testing 01/17/2021   Essential hypertension, benign    Hemorrhoids    Obsessive-compulsive disorders    Postmenopausal bleeding    Prolonged Q-T interval on ECG 06/02/2012   Rectal bleeding 06/21/2015   Schizoaffective disorder (HCC)    Schizophrenia (HCC) 01/12/2021   Syncope 06/02/2012   Tubular adenoma     Past Surgical History:  Procedure Laterality Date   BUNIONECTOMY     COLONOSCOPY N/A 07/05/2015   Dr.Rourk- internal hemorrhoids, redundant colon, colionic dierticulosis, colonic polyp. bx= tubular adenoma. next tcs 06/2020.    COLONOSCOPY WITH PROPOFOL N/A 10/12/2020   one 3 mm polyp in the cecum (tubular adenoma), sigmoid and descending colon diverticulosis.    HEMORRHOID BANDING  2017   Dr.Rourk   POLYPECTOMY  10/12/2020   Procedure: POLYPECTOMY INTESTINAL;  Surgeon: Corbin Ade, MD;  Location: [facility];  Service: Endoscopy;;  cecal colon polyp;     Family History  Problem Relation Age of Onset   Diabetes type II Maternal Grandmother    Diabetes Maternal Grandmother    Colon cancer Mother 62   Hypertension Maternal Aunt    Social History   Tobacco Use   Smoking status: Never    Passive exposure: Current   Smokeless tobacco: Never  Vaping Use   Vaping status: Never Used  Substance Use Topics   Alcohol use: No   Drug use: No    Allergies  Allergen Reactions   Omnicef [Cefdinir] Swelling    Oral swelling   Penicillins Rash    Current Meds  Medication Sig   Ascorbic Acid (VITAMIN C) 1000 MG tablet Take 1,000 mg by mouth daily.   atorvastatin (LIPITOR) 20 MG tablet Take 20 mg by mouth daily.   Calcium Citrate-Vitamin D (CALCIUM CITRATE + D3 PO) Take 1 tablet by mouth daily.   divalproex (DEPAKOTE) 500 MG DR tablet Take 1 tablet (500 mg total) by mouth 2 (two) times daily.   FLUoxetine (PROZAC) 40 MG capsule Take 1 capsule by mouth once daily   hydrochlorothiazide (HYDRODIURIL) 12.5 MG tablet Take 12.5 mg  by mouth daily.   ibuprofen (ADVIL,MOTRIN) 200 MG tablet Take 400 mg by mouth 2 (two) times daily as needed for headache.   levothyroxine (SYNTHROID) 75 MCG tablet Take 75 mcg by mouth every morning.   loperamide (IMODIUM) 2 MG capsule Take by mouth as needed for diarrhea or loose stools.   Multiple Vitamin (MULTIVITAMIN WITH MINERALS) TABS tablet Take 1 tablet by mouth daily.   Omega-3 Fatty Acids (FISH OIL) 1000 MG CAPS Take 1,000 mg by mouth daily.   oxybutynin (DITROPAN-XL) 5 MG 24 hr tablet Take 5 mg by mouth at bedtime.   propranolol (INDERAL) 10 MG tablet Take 20 mg by mouth 2 (two) times daily.   ziprasidone (GEODON) 40 MG capsule Take 1 capsule (40 mg total) by mouth 2 (two) times daily with a meal.    Objective: BP 113/64   Pulse 61   Ht 5\' 1"  (1.549 m)   Wt 194 lb (88 kg)   LMP 03/18/2012   BMI 36.66 kg/m   Physical Exam:  General: Alert and oriented. and No acute distress. Gait: Slow, steady gait.  Evaluation of lower back demonstrates no deformity.  No redness is appreciated.  No bruising.  Diffuse tenderness to palpation along the lower back.  Negative straight leg raise bilaterally.  She has good lower body strength.  1+ patellar tendon reflexes bilaterally.  Sensation intact throughout both lower extremities.  IMAGING: I personally reviewed images previously obtained in clinic  Lumbar spine x-rays were previously obtained.  No acute injuries.  Degenerative changes, especially within the lower spine. New Medications:  No orders of the defined types were placed in this encounter.     Oliver Barre, MD  06/18/2023 10:54 PM

## 2023-06-26 ENCOUNTER — Ambulatory Visit (HOSPITAL_COMMUNITY): Payer: 59 | Admitting: Psychiatry

## 2023-06-26 DIAGNOSIS — F251 Schizoaffective disorder, depressive type: Secondary | ICD-10-CM | POA: Diagnosis not present

## 2023-06-26 NOTE — Progress Notes (Signed)
IN- PERSON THERAPIST PROGRESS NOTE              Session Time:  Thursday  06/26/2023 2:00 PM   - 2:46 PM      Participation Level: Active  Behavioral Response: lethargic  Type of Therapy: Individual Therapy  Treatment Goals addressed: Icey will score less than 10 on the patient health questionnaire  Progress on goals: Progressing  Interventions: Supportive/CBT  Summary: [name] [name] is a 62 y.o. female who is a returning patient to this clinician. She  presents with a long-standing history of symptoms of anxiety and depression along with a previous diagnosis of schizoaffective disorder. She  reports a history of at least 3 psychiatric hospitalizations from 38 through 1990.   She has received outpatient services from [facility].     Patient last was seen via virtual visit about 3 weeks ago.  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  She expresses frustration regarding the incident she reports becoming upset but reports she refrained from any negative action.  Other than this, patient reports continuing to do well since last session. She has maintained involvement in activity including socializing with family and friends as well as attending church.  She also continues to work.  She reports becoming down and depressed once since last session.  She identifies trigger as lack of contact from her cousin at the time she thought he would call.  Patient reports initially feeling down but then watching TV and reading.  She reports avoiding having spiraling negative thoughts and states she realized that she was just lonely.  She later talked with cousin.   Suicidal/Homicidal: Nowithout intent/plan patient agrees to call this practice, call 911, have someone take her to the ED should symptoms worsen.  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 Adrian Blackwater  Patient/Guardian was advised Release of Information must be obtained prior to any record release in order to collaborate their care with an outside provider. Patient/Guardian was advised if they have not already done so to contact the registration department to sign all necessary forms in order for [name] to release information regarding their care.   Consent: Patient/Guardian gives verbal consent for treatment and assignment of benefits for services provided during this visit. Patient/Guardian expressed understanding and agreed to proceed.    Adah Salvage, LCSW 06/26/2023

## 2023-07-08 ENCOUNTER — Telehealth (HOSPITAL_COMMUNITY): Payer: 59 | Admitting: Psychiatry

## 2023-07-08 ENCOUNTER — Encounter (HOSPITAL_COMMUNITY): Payer: Self-pay | Admitting: Psychiatry

## 2023-07-08 DIAGNOSIS — Z79899 Other long term (current) drug therapy: Secondary | ICD-10-CM

## 2023-07-08 DIAGNOSIS — Z5181 Encounter for therapeutic drug level monitoring: Secondary | ICD-10-CM | POA: Diagnosis not present

## 2023-07-08 DIAGNOSIS — F251 Schizoaffective disorder, depressive type: Secondary | ICD-10-CM

## 2023-07-08 DIAGNOSIS — F411 Generalized anxiety disorder: Secondary | ICD-10-CM

## 2023-07-08 MED ORDER — ZIPRASIDONE HCL 40 MG PO CAPS: 40.0000 mg | ORAL_CAPSULE | Freq: Two times a day (BID) | ORAL | 1 refills | Status: DC

## 2023-07-08 MED ORDER — DIVALPROEX SODIUM 500 MG PO DR TAB: 500.0000 mg | DELAYED_RELEASE_TABLET | Freq: Two times a day (BID) | ORAL | 1 refills | Status: DC

## 2023-07-08 MED ORDER — FLUOXETINE HCL 40 MG PO CAPS
40.0000 mg | ORAL_CAPSULE | Freq: Every day | ORAL | 0 refills | Status: DC
Start: 2023-07-08 — End: 2023-09-09

## 2023-07-08 NOTE — Patient Instructions (Signed)
We did not make any medication changes today.
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
## 2023-07-10 ENCOUNTER — Ambulatory Visit (HOSPITAL_COMMUNITY): Payer: 59 | Admitting: Psychiatry

## 2023-08-12 DIAGNOSIS — E782 Mixed hyperlipidemia: Secondary | ICD-10-CM | POA: Diagnosis not present

## 2023-08-12 DIAGNOSIS — E039 Hypothyroidism, unspecified: Secondary | ICD-10-CM | POA: Diagnosis not present

## 2023-08-12 DIAGNOSIS — I10 Essential (primary) hypertension: Secondary | ICD-10-CM | POA: Diagnosis not present

## 2023-08-14 DIAGNOSIS — R7303 Prediabetes: Secondary | ICD-10-CM | POA: Diagnosis not present

## 2023-08-14 DIAGNOSIS — F411 Generalized anxiety disorder: Secondary | ICD-10-CM | POA: Diagnosis not present

## 2023-08-14 DIAGNOSIS — I10 Essential (primary) hypertension: Secondary | ICD-10-CM | POA: Diagnosis not present

## 2023-08-14 DIAGNOSIS — F329 Major depressive disorder, single episode, unspecified: Secondary | ICD-10-CM | POA: Diagnosis not present

## 2023-08-14 DIAGNOSIS — M25551 Pain in right hip: Secondary | ICD-10-CM | POA: Diagnosis not present

## 2023-08-14 DIAGNOSIS — F209 Schizophrenia, unspecified: Secondary | ICD-10-CM | POA: Diagnosis not present

## 2023-08-14 DIAGNOSIS — M545 Low back pain, unspecified: Secondary | ICD-10-CM | POA: Diagnosis not present

## 2023-08-14 DIAGNOSIS — R251 Tremor, unspecified: Secondary | ICD-10-CM | POA: Diagnosis not present

## 2023-08-14 DIAGNOSIS — E782 Mixed hyperlipidemia: Secondary | ICD-10-CM | POA: Diagnosis not present

## 2023-08-14 DIAGNOSIS — F5101 Primary insomnia: Secondary | ICD-10-CM | POA: Diagnosis not present

## 2023-08-14 DIAGNOSIS — E039 Hypothyroidism, unspecified: Secondary | ICD-10-CM | POA: Diagnosis not present

## 2023-08-21 ENCOUNTER — Ambulatory Visit (HOSPITAL_COMMUNITY): Payer: 59 | Admitting: Psychiatry

## 2023-08-21 DIAGNOSIS — F251 Schizoaffective disorder, depressive type: Secondary | ICD-10-CM

## 2023-08-21 NOTE — Progress Notes (Signed)
IN- PERSON THERAPIST PROGRESS NOTE              Session Time:  Thursday  08/21/2023 11:12 AM - 11:43 AM      Participation Level: Active  Behavioral Response: lethargic  Type of Therapy: Individual Therapy  Treatment Goals addressed: [name] will score less than 10 on the patient health questionnaire  Progress on goals: Progressing  Interventions: Supportive/CBT  Summary: [name] [name] is a 63 y.o. female who is a returning patient to this clinician. She  presents with a long-standing history of symptoms of anxiety and depression along with a previous diagnosis of schizoaffective disorder. She  reports a history of at least 3 psychiatric hospitalizations from 1 through 1990.   She has received outpatient services from [facility] and [facility].     Patient last was seen via virtual visit about 7 weeks ago.  She reports doing well since last session. She enjoyed the holidays and says she enjoyed spending time with her family and friends. She reports becoming a little down on Christmas Eve as family members had eaten dinner earlier that day while pt was at work. They had prepared pt a plate and had it ready for patient when she stopped by after work. She reports initially feeling down and having negative thoughts about family maybe not wanting her there since plate was already prepared. However, she states she didn't feel down long and started thinking about other things. She reports work is going well and recently praise from her boss as well as a raise. She reports things are going well with her cousin.  Suicidal/Homicidal: Nowithout intent/plan patient agrees to call this practice, call 911, have someone take her to the ED should symptoms worsen.  Therapist Response:  reviewed symptoms, praised and reinforced pt's continued behavioral activation  and socialization, discussed effects, discussed recent incident regarding family preparing pt a plate of food on Christmas Eve,  assisted  patient identify and replace catastrophizing thoughts with more helpful thoughts about the incident, praised and reinforced patient's increased awareness of her thoughts as well as her efforts to avoid dwelling on negative thoughts, congratulated pt on praise from her boss and raise.  Plan: Return in 3-4 weeks        Diagnosis: Axis I: Schizoaffective Disorder     Collaboration of Care: Psychiatrist AEB patient sees psychiatrist Dr. Adrian Blackwater  Patient/Guardian was advised Release of Information must be obtained prior to any record release in order to collaborate their care with an outside provider. Patient/Guardian was advised if they have not already done so to contact the registration department to sign all necessary forms in order for [name] to release information regarding their care.   Consent: Patient/Guardian gives verbal consent for treatment and assignment of benefits for services provided during this visit. Patient/Guardian expressed understanding and agreed to proceed.    Adah Salvage, LCSW 08/21/2023

## 2023-09-04 ENCOUNTER — Ambulatory Visit (INDEPENDENT_AMBULATORY_CARE_PROVIDER_SITE_OTHER): Payer: 59 | Admitting: Psychiatry

## 2023-09-04 DIAGNOSIS — F251 Schizoaffective disorder, depressive type: Secondary | ICD-10-CM | POA: Diagnosis not present

## 2023-09-04 NOTE — Progress Notes (Signed)
IN- PERSON THERAPIST PROGRESS NOTE              Session Time:  Thursday  09/04/2023 11:10 AM -  10:49 AM        Participation Level: Active  Behavioral Response: lethargic  Type of Therapy: Individual Therapy  Treatment Goals addressed: Perrie will score less than 10 on the patient health questionnaire  Progress on goals: Progressing  Interventions: Supportive/CBT  Summary: [name] is a 64 y.o. female who is a returning patient to this clinician. She  presents with a long-standing history of symptoms of anxiety and depression along with a previous diagnosis of schizoaffective disorder. She  reports a history of at least 3 psychiatric hospitalizations from 31 through 1990.   She has received outpatient services from [facility] and [facility].     Patient last was seen about 2 weeks ago.  She reports doing well overall but feeling down a few days since last session.  She denies any SI. Triggers include arriving late to attend a church where she has has negative experiences in the past. She had thoughts of having a negative effect on church service because she was late. She later experienced thoughts of no one loving her and feelings of loneliness. She reports eventually feeling better after doing activities and having contact with other people. She continues to socialize with family and friends. She is pleased she has reconnected with an aunt and uncle who are now very supportive and trying to help patient per her report. She reports continuing to do well at work and reports enjoying a recent Psychologist, forensic for her and other employees.  Suicidal/Homicidal: Nowithout intent/plan  Therapist Response:  reviewed symptoms, assisted pt identify triggers of depressed mood, assisted pt identify, challenge, and replace negative thoughts, discussed ways of avoiding some triggers, praised and reinforce pt' use of healthy coping strategies, began to assist pt develop list of supportive  people and positive relationships in her life, developed plan with pt to continue efforts to maintain behavioral activation and socialization.  Plan: Return in 3-4 weeks        Diagnosis: Axis I: Schizoaffective Disorder     Collaboration of Care: Psychiatrist AEB patient sees psychiatrist Dr. Adrian Blackwater  Patient/Guardian was advised Release of Information must be obtained prior to any record release in order to collaborate their care with an outside provider. Patient/Guardian was advised if they have not already done so to contact the registration department to sign all necessary forms in order for [name] to release information regarding their care.   Consent: Patient/Guardian gives verbal consent for treatment and assignment of benefits for services provided during this visit. Patient/Guardian expressed understanding and agreed to proceed.    Adah Salvage, LCSW 09/04/2023

## 2023-09-09 ENCOUNTER — Telehealth (INDEPENDENT_AMBULATORY_CARE_PROVIDER_SITE_OTHER): Payer: 59 | Admitting: Psychiatry

## 2023-09-09 ENCOUNTER — Encounter (HOSPITAL_COMMUNITY): Payer: Self-pay | Admitting: Psychiatry

## 2023-09-09 DIAGNOSIS — Z5181 Encounter for therapeutic drug level monitoring: Secondary | ICD-10-CM

## 2023-09-09 DIAGNOSIS — R251 Tremor, unspecified: Secondary | ICD-10-CM | POA: Diagnosis not present

## 2023-09-09 DIAGNOSIS — F251 Schizoaffective disorder, depressive type: Secondary | ICD-10-CM | POA: Diagnosis not present

## 2023-09-09 DIAGNOSIS — Z79899 Other long term (current) drug therapy: Secondary | ICD-10-CM

## 2023-09-09 DIAGNOSIS — R413 Other amnesia: Secondary | ICD-10-CM

## 2023-09-09 DIAGNOSIS — F411 Generalized anxiety disorder: Secondary | ICD-10-CM | POA: Diagnosis not present

## 2023-09-09 MED ORDER — FLUOXETINE HCL 40 MG PO CAPS
40.0000 mg | ORAL_CAPSULE | Freq: Every day | ORAL | 0 refills | Status: DC
Start: 2023-09-09 — End: 2023-10-21

## 2023-09-09 NOTE — Patient Instructions (Signed)
 We did not make any medication changes today.
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
## 2023-09-18 ENCOUNTER — Ambulatory Visit (INDEPENDENT_AMBULATORY_CARE_PROVIDER_SITE_OTHER): Payer: 59 | Admitting: Psychiatry

## 2023-09-18 ENCOUNTER — Telehealth (HOSPITAL_COMMUNITY): Payer: Self-pay | Admitting: Psychiatry

## 2023-09-18 DIAGNOSIS — F251 Schizoaffective disorder, depressive type: Secondary | ICD-10-CM

## 2023-09-18 NOTE — Telephone Encounter (Signed)
Opened in error

## 2023-09-18 NOTE — Progress Notes (Signed)
IN- PERSON THERAPIST PROGRESS NOTE              Session Time:  Thursday  09/18/2023 10:10 AM  - 10:59 AM      Participation Level: Active  Behavioral Response: lethargic  Type of Therapy: Individual Therapy  Treatment Goals addressed: [place]kia will score less than 10 on the patient health questionnaire  Progress on goals: Progressing  Interventions: Supportive/CBT  Summary: [name] [name] is a 63 y.o. female who is a returning patient to this clinician. She  presents with a long-standing history of symptoms of anxiety and depression along with a previous diagnosis of schizoaffective disorder. She  reports a history of at least 3 psychiatric hospitalizations from 73 through 1990.   She has received outpatient services from [facility] and [facility].     Patient last was seen about 2 weeks ago.  She reports doing well since last session. She has remained involved in activities including working, socializing with family and friends, and attending church. She reports feeling good about self and her progress in therapy. She is pleased with her recent decision in avoiding being in a negative environment at a church she attended in the past. Pt reports continued strong support from family and friends. Suicidal/Homicidal: Nowithout intent/plan   Therapist Response:  reviewed symptoms, administered PHQ 2 & 9, praised and reinforced pt's continued behavioral activation/socialization with family and friends, discussed effects,  provided psychoeducation on lapse versus relapse, assisted pt identify early warning signs of depression and ways to intervene to avoid relapse,  Plan: Return in 3-4 weeks        Diagnosis: Axis I: Schizoaffective Disorder     Collaboration of Care: Psychiatrist AEB patient sees psychiatrist Dr. Adrian Blackwater  Patient/Guardian was advised Release of Information must be obtained prior to any record release in order to collaborate their care with an outside provider.  Patient/Guardian was advised if they have not already done so to contact the registration department to sign all necessary forms in order for [name] to release information regarding their care.   Consent: Patient/Guardian gives verbal consent for treatment and assignment of benefits for services provided during this visit. Patient/Guardian expressed understanding and agreed to proceed.    Adah Salvage, LCSW 09/18/2023

## 2023-10-02 ENCOUNTER — Ambulatory Visit (HOSPITAL_COMMUNITY): Payer: 59 | Admitting: Psychiatry

## 2023-10-02 DIAGNOSIS — F251 Schizoaffective disorder, depressive type: Secondary | ICD-10-CM

## 2023-10-02 NOTE — Progress Notes (Signed)
 IN- PERSON THERAPIST PROGRESS NOTE              Session Time:  Thursday  10/02/2023 10:17 AM  -  11:00 AM      Participation Level: Active  Behavioral Response: lethargic  Type of Therapy: Individual Therapy  Treatment Goals addressed: [name] will score less than 10 on the patient health questionnaire  Progress on goals: Progressing  Interventions: Supportive/CBT  Summary: [name] [name] is a 63 y.o. female who is a returning patient to this clinician. She  presents with a long-standing history of symptoms of anxiety and depression along with a previous diagnosis of schizoaffective disorder. She  reports a history of at least 3 psychiatric hospitalizations from 64 through 1990.   She has received outpatient services from [facility] and [facility].     Patient last was seen about 2 weeks ago.  She reports not feeling well for the past week due to some type of virus.  However, she states her mood has been good.  Prior to being sick, she remained involved in activities including working, socializing with family and friends, and attending church.  She reports encountering a couple of situations that normally would have triggered negative thoughts about the situation as well as herself.  However, she reports being more aware and generating more rational thoughts.  She continues to report feeling good about self and her progress in therapy. Pt reports continued strong support from family and friends. Suicidal/Homicidal: Nowithout intent/plan           Therapist Response:  reviewed symptoms, praised and reinforced pt's continued behavioral activation/socialization with family and friends, discussed effects, praised and reinforced patient's increased awareness of her thoughts and her efforts to examine and replace irrational thoughts assisted patient identify connection between thoughts/mood/behavior,   Plan: Return in 3-4 weeks        Diagnosis: Axis I: Schizoaffective  Disorder     Collaboration of Care: Psychiatrist AEB patient sees psychiatrist Dr. Adrian Blackwater  Patient/Guardian was advised Release of Information must be obtained prior to any record release in order to collaborate their care with an outside provider. Patient/Guardian was advised if they have not already done so to contact the registration department to sign all necessary forms in order for [name] to release information regarding their care.   Consent: Patient/Guardian gives verbal consent for treatment and assignment of benefits for services provided during this visit. Patient/Guardian expressed understanding and agreed to proceed.    Adah Salvage, LCSW 10/02/2023

## 2023-10-21 ENCOUNTER — Telehealth (INDEPENDENT_AMBULATORY_CARE_PROVIDER_SITE_OTHER): Admitting: Psychiatry

## 2023-10-21 ENCOUNTER — Encounter (HOSPITAL_COMMUNITY): Payer: Self-pay | Admitting: Psychiatry

## 2023-10-21 DIAGNOSIS — F411 Generalized anxiety disorder: Secondary | ICD-10-CM

## 2023-10-21 DIAGNOSIS — F251 Schizoaffective disorder, depressive type: Secondary | ICD-10-CM | POA: Diagnosis not present

## 2023-10-21 DIAGNOSIS — R251 Tremor, unspecified: Secondary | ICD-10-CM | POA: Diagnosis not present

## 2023-10-21 DIAGNOSIS — Z79899 Other long term (current) drug therapy: Secondary | ICD-10-CM | POA: Diagnosis not present

## 2023-10-21 DIAGNOSIS — Z5181 Encounter for therapeutic drug level monitoring: Secondary | ICD-10-CM | POA: Diagnosis not present

## 2023-10-21 DIAGNOSIS — F5101 Primary insomnia: Secondary | ICD-10-CM

## 2023-10-21 MED ORDER — FLUOXETINE HCL 20 MG PO CAPS: 20.0000 mg | ORAL_CAPSULE | Freq: Every day | ORAL | 0 refills | Status: DC

## 2023-10-21 MED ORDER — ZIPRASIDONE HCL 40 MG PO CAPS
40.0000 mg | ORAL_CAPSULE | Freq: Every evening | ORAL | 0 refills | Status: DC
Start: 2023-10-21 — End: 2023-12-02

## 2023-10-21 NOTE — Patient Instructions (Addendum)
 Before you make any medication changes in the future please let me know if you are considering doing so.  It is very important to remain on some form of antipsychotic like ziprasidone to prevent return of hallucinations and suicidal ideation.  We decreased the ziprasidone to 40 mg nightly with meals and that should help your sleep again.  We also restarted fluoxetine (Prozac) but at a lower dose of 20 mg once daily.  Please coordinate with your primary care provider to get an updated EKG.  I have placed orders for a valproic acid level, lipid panel, A1c, CBC, CMP so please come by our office to get those drawn when you get a chance.
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
## 2023-10-23 ENCOUNTER — Ambulatory Visit (INDEPENDENT_AMBULATORY_CARE_PROVIDER_SITE_OTHER): Payer: 59 | Admitting: Psychiatry

## 2023-10-23 DIAGNOSIS — F251 Schizoaffective disorder, depressive type: Secondary | ICD-10-CM | POA: Diagnosis not present

## 2023-10-23 NOTE — Progress Notes (Addendum)
 IN- PERSON THERAPIST PROGRESS NOTE              Session Time:  Thursday  10/23/2023 11:05 AM -  11:46 AM      Participation Level: Active  Behavioral Response: euthymic, talkative, tangentiality    Type of Therapy: Individual Therapy  Treatment Goals addressed: [name] will score less than 10 on the patient health questionnaire  Progress on goals: Progressing  Interventions: Supportive/CBT  Summary: [name] [name] is a 63 y.o. female who is a returning patient to this clinician. She  presents with a long-standing history of symptoms of anxiety and depression along with a previous diagnosis of schizoaffective disorder. She  reports a history of at least 3 psychiatric hospitalizations from 27 through 1990.   She has received outpatient services from [facility] and [facility].     Patient last was seen about 3-4 weeks ago.  Per her report, she suddenly stopped taking Prozac and ziprasidone for about a week as she just became tired of taking the medication.  She later informed psychiatrist Dr. Adrian Blackwater about discontinuing the medication and agreed to restart.  Patient states now understanding why she needs to be on the medication and reports she has resumed taking the medication as prescribed.  She also reports she just resumed taking Depakote yesterday as she was without it for 4 days due to difficulty having prescription filled at pharmacy.  Patient is very talkative today and reports feeling a little hyper at the beginning of session but reports feeling more calm by the end of session.  She denies having any suicidal ideations or feeling down since last session.  She has maintained involvement in activity including working and socializing with family/friends.     Suicidal/Homicidal: Nowithout intent/plan           Therapist Response:  reviewed symptoms, praised and reinforced patient's contact with psychiatrist Dr. Adrian Blackwater informing him of her decision about the medication, praised  and reinforced resumed use of medication, discussed the role of medication compliance in avoiding relapse, discussed using relaxation techniques and developed plan with patient to practice including deep breathing and physical activity such as walking pt's continued behavioral activation/socialization with family and friends. Plan: Return in 3-4 weeks        Diagnosis: Axis I: Schizoaffective Disorder     Collaboration of Care: Psychiatrist AEB patient sees psychiatrist Dr. Adrian Blackwater  Patient/Guardian was advised Release of Information must be obtained prior to any record release in order to collaborate their care with an outside provider. Patient/Guardian was advised if they have not already done so to contact the registration department to sign all necessary forms in order for [name] to release information regarding their care.   Consent: Patient/Guardian gives verbal consent for treatment and assignment of benefits for services provided during this visit. Patient/Guardian expressed understanding and agreed to proceed.    Adah Salvage, LCSW 10/23/2023

## 2023-10-24 ENCOUNTER — Telehealth (HOSPITAL_COMMUNITY): Payer: Self-pay

## 2023-10-24 NOTE — Addendum Note (Signed)
 Addended by: Tia Masker on: 10/24/2023 08:55 AM   Modules accepted: Orders

## 2023-10-24 NOTE — Telephone Encounter (Signed)
 Pt called in stating that Dr Scharlene Gloss office is needing something from Dr Adrian Blackwater stating that pt needs to have an EKG and labs done. Please advise.

## 2023-10-24 NOTE — Telephone Encounter (Signed)
 Called pt to come by and get orders or fill out release no answer left vm

## 2023-11-06 DIAGNOSIS — Z0189 Encounter for other specified special examinations: Secondary | ICD-10-CM | POA: Diagnosis not present

## 2023-11-06 DIAGNOSIS — Z79899 Other long term (current) drug therapy: Secondary | ICD-10-CM | POA: Diagnosis not present

## 2023-11-06 DIAGNOSIS — Z5181 Encounter for therapeutic drug level monitoring: Secondary | ICD-10-CM | POA: Diagnosis not present

## 2023-11-07 LAB — COMPREHENSIVE METABOLIC PANEL WITH GFR
AG Ratio: 1.5 (calc) (ref 1.0–2.5)
ALT: 10 U/L (ref 6–29)
AST: 18 U/L (ref 10–35)
Albumin: 4 g/dL (ref 3.6–5.1)
Alkaline phosphatase (APISO): 49 U/L (ref 37–153)
BUN: 18 mg/dL (ref 7–25)
CO2: 29 mmol/L (ref 20–32)
Calcium: 9.3 mg/dL (ref 8.6–10.4)
Chloride: 102 mmol/L (ref 98–110)
Creat: 0.68 mg/dL (ref 0.50–1.05)
Globulin: 2.6 g/dL (ref 1.9–3.7)
Glucose, Bld: 76 mg/dL (ref 65–99)
Potassium: 4.6 mmol/L (ref 3.5–5.3)
Sodium: 139 mmol/L (ref 135–146)
Total Bilirubin: 0.3 mg/dL (ref 0.2–1.2)
Total Protein: 6.6 g/dL (ref 6.1–8.1)
eGFR: 98 mL/min/{1.73_m2} (ref >=60)

## 2023-11-07 LAB — HEMOGLOBIN A1C
Hgb A1c MFr Bld: 6 %{Hb} — ABNORMAL HIGH (ref <5.7)
Mean Plasma Glucose: 126 mg/dL
eAG (mmol/L): 7 mmol/L

## 2023-11-07 LAB — CBC
HCT: 41.6 % (ref 35.0–45.0)
Hemoglobin: 13.8 g/dL (ref 11.7–15.5)
MCH: 30.9 pg (ref 27.0–33.0)
MCHC: 33.2 g/dL (ref 32.0–36.0)
MCV: 93.1 fL (ref 80.0–100.0)
MPV: 9.9 fL (ref 7.5–12.5)
Platelets: 257 10*3/uL (ref 140–400)
RBC: 4.47 10*6/uL (ref 3.80–5.10)
RDW: 13.1 % (ref 11.0–15.0)
WBC: 7.9 10*3/uL (ref 3.8–10.8)

## 2023-11-07 LAB — VALPROIC ACID LEVEL: Valproic Acid Lvl: 88.3 mg/L (ref 50.0–100.0)

## 2023-11-07 LAB — LIPID PANEL
Cholesterol: 122 mg/dL (ref <200)
HDL: 46 mg/dL — ABNORMAL LOW (ref >=50)
LDL Cholesterol (Calc): 53 mg/dL
Non-HDL Cholesterol (Calc): 76 mg/dL (ref <130)
Total CHOL/HDL Ratio: 2.7 (calc) (ref <5.0)
Triglycerides: 149 mg/dL (ref <150)

## 2023-11-13 ENCOUNTER — Ambulatory Visit (INDEPENDENT_AMBULATORY_CARE_PROVIDER_SITE_OTHER): Payer: 59 | Admitting: Psychiatry

## 2023-11-13 DIAGNOSIS — F251 Schizoaffective disorder, depressive type: Secondary | ICD-10-CM | POA: Diagnosis not present

## 2023-11-13 NOTE — Progress Notes (Addendum)
 IN- PERSON THERAPIST PROGRESS NOTE              Session Time:  Thursday  11/13/2023 10:05 AM -  10:50 AM       Participation Level: Active  Behavioral Response: sad, talkative, tangentiality    Type of Therapy: Individual Therapy  Treatment Goals addressed: [name] [name] will score less than 10 on the patient health questionnaire  Progress on goals: Progressing  Interventions: Supportive/CBT  Summary: [name] [name] is a 63 y.o. female who is a returning patient to this clinician. She  presents with a long-standing history of symptoms of anxiety and depression along with a previous diagnosis of schizoaffective disorder. She  reports a history of at least 3 psychiatric hospitalizations from 59 through 1990.   She has received outpatient services from [facility] and [facility].     Patient last was seen about 2 weeks ago.  Per her report, she has maintained medication compliance since last session.  However she reports feeling down and lonely for the past few days.  She verbalizes thoughts of not wanting to live anymore but denies any intent or plan to harm self.  She identifies trigger as being attending her cousin's wedding this past weekend.  She was upset with self as she did not RSVP as she did not realize that was expected.  She then began experiencing negative thoughts about self and thoughts that others did not want her there although people were warm and inviting per her report.  She continued to have negative thoughts about other interactions with her family members for the remainder of the day.  The wedding also triggered memories of her deceased husband.  She also reports increased loneliness triggered by spending less time with her cousin due to his work schedule.    Suicidal/Homicidal: Nowithout intent/plan           Therapist Response:  reviewed symptoms, praised and reinforced patient's medication compliance, discussed stressors, facilitated expression of thoughts and  feelings, validated feelings, praised and reinforced patient being able to identify triggers of increased sadness/loneliness, assisted patient identify/challenge/and replace negative thought patterns with more rational thought patterns, assisted patient identify ways to cope with loneliness with behavioral activation/contact with members of her support system/using her spirituality/and reviewing list of people who care about her developed in a previous session   Plan: Return in 3-4 weeks        Diagnosis: Axis I: Schizoaffective Disorder     Collaboration of Care: Psychiatrist AEB patient sees psychiatrist Dr. Cathyann Cobia  Patient/Guardian was advised Release of Information must be obtained prior to any record release in order to collaborate their care with an outside provider. Patient/Guardian was advised if they have not already done so to contact the registration department to sign all necessary forms in order for us  to release information regarding their care.   Consent: Patient/Guardian gives verbal consent for treatment and assignment of benefits for services provided during this visit. Patient/Guardian expressed understanding and agreed to proceed.    Dicie Foster, LCSW 11/13/2023

## 2023-11-17 ENCOUNTER — Telehealth (HOSPITAL_COMMUNITY): Admitting: Psychiatry

## 2023-11-25 ENCOUNTER — Telehealth (HOSPITAL_COMMUNITY): Payer: 59 | Admitting: Psychiatry

## 2023-11-27 ENCOUNTER — Ambulatory Visit (INDEPENDENT_AMBULATORY_CARE_PROVIDER_SITE_OTHER): Payer: 59 | Admitting: Psychiatry

## 2023-11-27 DIAGNOSIS — F251 Schizoaffective disorder, depressive type: Secondary | ICD-10-CM

## 2023-11-27 NOTE — Progress Notes (Signed)
 IN- PERSON THERAPIST PROGRESS NOTE              Session Time:  Thursday  11/27/2023 11:02 AM -  11:53 AM      Participation Level: Active  Behavioral Response: sad, talkative, tangentiality    Type of Therapy: Individual Therapy  Treatment Goals addressed: Kerina will score less than 10 on the patient health questionnaire  Progress on goals: Progressing  Interventions: Supportive/CBT  Summary: [name] [name] is a 63 y.o. female who is a returning patient to this clinician. She  presents with a long-standing history of symptoms of anxiety and depression along with a previous diagnosis of schizoaffective disorder. She  reports a history of at least 3 psychiatric hospitalizations from 11 through 1990.   She has received outpatient services from [facility] and [facility].     Patient last was seen about 2 weeks ago.  Per her report, she has maintained medication compliance since last session.  However she reports continuing to feel down and lonely.  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  Patient reports having thoughts store manager was angry with her as patient made a mistake.  Patient shared information regarding the interaction and information seems to contradict patient's thoughts.  Patient reports she did not implement strategies discussed in last session and cannot find a list of support people developed in a previous session.     Suicidal/Homicidal: Nowithout intent/plan           Therapist Response:  reviewed symptoms, praised and reinforced patient's medication compliance, discussed stressors, facilitated expression of thoughts and feelings, validated feelings,assisted patient identify/challenge/and replace negative thought patterns with more rational thought patterns, assisted patient identify ways to cope with loneliness with behavioral activation/contact with members of her support system/using her  spirituality/and reviewing list of people who care about her, assisted patient develop written list of specific strategies and provided patient with extra copies  Plan: Return in 3-4 weeks        Diagnosis: Axis I: Schizoaffective Disorder     Collaboration of Care: Psychiatrist AEB patient sees psychiatrist Dr. Cathyann Cobia  Patient/Guardian was advised Release of Information must be obtained prior to any record release in order to collaborate their care with an outside provider. Patient/Guardian was advised if they have not already done so to contact the registration department to sign all necessary forms in order for us  to release information regarding their care.   Consent: Patient/Guardian gives verbal consent for treatment and assignment of benefits for services provided during this visit. Patient/Guardian expressed understanding and agreed to proceed.    Dicie Foster, LCSW 11/27/2023

## 2023-12-02 ENCOUNTER — Encounter (HOSPITAL_COMMUNITY): Payer: Self-pay | Admitting: Psychiatry

## 2023-12-02 ENCOUNTER — Telehealth (INDEPENDENT_AMBULATORY_CARE_PROVIDER_SITE_OTHER): Payer: 59 | Admitting: Psychiatry

## 2023-12-02 DIAGNOSIS — F251 Schizoaffective disorder, depressive type: Secondary | ICD-10-CM | POA: Diagnosis not present

## 2023-12-02 DIAGNOSIS — Z79899 Other long term (current) drug therapy: Secondary | ICD-10-CM | POA: Diagnosis not present

## 2023-12-02 DIAGNOSIS — Z5181 Encounter for therapeutic drug level monitoring: Secondary | ICD-10-CM

## 2023-12-02 DIAGNOSIS — F411 Generalized anxiety disorder: Secondary | ICD-10-CM

## 2023-12-02 DIAGNOSIS — R251 Tremor, unspecified: Secondary | ICD-10-CM

## 2023-12-02 MED ORDER — ZIPRASIDONE HCL 40 MG PO CAPS: 40.0000 mg | ORAL_CAPSULE | Freq: Two times a day (BID) | ORAL | 1 refills | Status: DC
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
## 2023-12-02 NOTE — Patient Instructions (Addendum)
 We increased the geodon  back to 40mg  twice daily with food today. For the upcoming anniversary on Friday, try to not be alone on that day. Keep trying to challenge those negative thoughts with helpful truths like we practiced in session today.

## 2023-12-09 ENCOUNTER — Encounter (HOSPITAL_COMMUNITY): Payer: Self-pay | Admitting: Psychiatry

## 2023-12-09 ENCOUNTER — Telehealth (INDEPENDENT_AMBULATORY_CARE_PROVIDER_SITE_OTHER): Admitting: Psychiatry

## 2023-12-09 DIAGNOSIS — F251 Schizoaffective disorder, depressive type: Secondary | ICD-10-CM

## 2023-12-09 DIAGNOSIS — Z5181 Encounter for therapeutic drug level monitoring: Secondary | ICD-10-CM | POA: Diagnosis not present

## 2023-12-09 DIAGNOSIS — Z79899 Other long term (current) drug therapy: Secondary | ICD-10-CM | POA: Diagnosis not present

## 2023-12-09 DIAGNOSIS — R251 Tremor, unspecified: Secondary | ICD-10-CM | POA: Diagnosis not present

## 2023-12-09 DIAGNOSIS — F411 Generalized anxiety disorder: Secondary | ICD-10-CM | POA: Diagnosis not present

## 2023-12-09 MED ORDER — FLUOXETINE HCL 40 MG PO CAPS: 40.0000 mg | ORAL_CAPSULE | Freq: Every day | ORAL | 1 refills | Status: DC
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
## 2023-12-09 NOTE — Patient Instructions (Signed)
 We increased the fluoxetine  to 40 mg once daily today.

## 2023-12-11 ENCOUNTER — Ambulatory Visit (INDEPENDENT_AMBULATORY_CARE_PROVIDER_SITE_OTHER): Payer: 59 | Admitting: Psychiatry

## 2023-12-11 DIAGNOSIS — F251 Schizoaffective disorder, depressive type: Secondary | ICD-10-CM

## 2023-12-11 NOTE — Progress Notes (Signed)
 IN- PERSON THERAPIST PROGRESS NOTE              Session Time:  Thursday  12/11/2023 10:02 AM - 10:44 AM      Participation Level: Active  Behavioral Response: less depressed, talkative, tangentiality    Type of Therapy: Individual Therapy  Treatment Goals addressed: [name] will score less than 10 on the patient health questionnaire  Progress on goals: Progressing  Interventions: Supportive/CBT  Summary: [name] [name] is a 63 y.o. female who is a returning patient to this clinician. She  presents with a long-standing history of symptoms of anxiety and depression along with a previous diagnosis of schizoaffective disorder. She  reports a history of at least 3 psychiatric hospitalizations from 71 through 1990.   She has received outpatient services from [facility] and [facility].     Patient last was seen about 2 weeks ago.  Per her report, she has maintained medication compliance since last session.  She reports feeling less down since last session.  She reports still having thoughts of having no reason to live but reports these have not happened as much.  Per her report, thoughts tend to occur when she is bored.  However, she has made more efforts to become more involved in activities.  She reports recently enjoying going out to eat with a friend.  She continues to have frequent contact with her cousin.  She also attended a [employer] at a church this past weekend.  She attended church this past Sunday and says 2 people prayed for her and this was helpful.  Patient continues to work and enjoy her job.  Per her report, this caused her to feel better.         Suicidal/Homicidal: Nowithout intent/plan           Therapist Response:  reviewed symptoms, praised and reinforced patient's medication compliance, discussed stressors, facilitated expression of thoughts and feelings, validated feelings, praised and reinforced patient's increased involvement in activity, discussed effects  on thoughts/mood/behavior, assisted patient identify ways to maintain consistency by using daily planning, also encouraged patient to continue to review list of people in her support group and use strategies discussed in last session Plan: Return in 3-4 weeks        Diagnosis: Axis I: Schizoaffective Disorder     Collaboration of Care: Psychiatrist AEB patient sees psychiatrist Dr. Cathyann Cobia  Patient/Guardian was advised Release of Information must be obtained prior to any record release in order to collaborate their care with an outside provider. Patient/Guardian was advised if they have not already done so to contact the registration department to sign all necessary forms in order for us  to release information regarding their care.   Consent: Patient/Guardian gives verbal consent for treatment and assignment of benefits for services provided during this visit. Patient/Guardian expressed understanding and agreed to proceed.    Dicie Foster, LCSW 12/11/2023

## 2024-01-09 DIAGNOSIS — R42 Dizziness and giddiness: Secondary | ICD-10-CM | POA: Diagnosis not present

## 2024-01-09 DIAGNOSIS — R278 Other lack of coordination: Secondary | ICD-10-CM | POA: Diagnosis not present

## 2024-01-09 DIAGNOSIS — E039 Hypothyroidism, unspecified: Secondary | ICD-10-CM | POA: Diagnosis not present

## 2024-01-15 ENCOUNTER — Ambulatory Visit (INDEPENDENT_AMBULATORY_CARE_PROVIDER_SITE_OTHER): Admitting: Psychiatry

## 2024-01-15 DIAGNOSIS — F251 Schizoaffective disorder, depressive type: Secondary | ICD-10-CM
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
## 2024-01-27 ENCOUNTER — Encounter (HOSPITAL_COMMUNITY): Payer: Self-pay

## 2024-01-27 ENCOUNTER — Emergency Department (HOSPITAL_COMMUNITY)

## 2024-01-27 ENCOUNTER — Emergency Department (HOSPITAL_COMMUNITY)
Admission: EM | Admit: 2024-01-27 | Discharge: 2024-01-28 | Disposition: A | Discharge status: Home / Self Care | Attending: Emergency Medicine | Admitting: Emergency Medicine

## 2024-01-27 ENCOUNTER — Other Ambulatory Visit: Payer: Self-pay

## 2024-01-27 DIAGNOSIS — R109 Unspecified abdominal pain: Secondary | ICD-10-CM | POA: Diagnosis not present

## 2024-01-27 DIAGNOSIS — Z79899 Other long term (current) drug therapy: Secondary | ICD-10-CM | POA: Diagnosis not present

## 2024-01-27 DIAGNOSIS — I10 Essential (primary) hypertension: Secondary | ICD-10-CM | POA: Insufficient documentation

## 2024-01-27 DIAGNOSIS — K5900 Constipation, unspecified: Secondary | ICD-10-CM | POA: Diagnosis not present

## 2024-01-27 NOTE — ED Provider Notes (Signed)
 [place] EMERGENCY DEPARTMENT AT [facility] Provider Note   CSN: [id number] Arrival date & time: 01/27/24  2250     Patient presents with: Abdominal Pain and Headache   [name] [name] is a 63 y.o. female.   Patient is a 63 year old female with past medical history of hypertension, hyperlipidemia, insomnia, schizoaffective.  Patient presenting today with complaints of constipation.  She reports abdominal cramping starting today around noon, then woke up later this evening with a headache.  She has not had a bowel movement in several days.  No fevers or chills.       Prior to Admission medications   Medication Sig Start Date End Date Taking? Authorizing Provider  Ascorbic Acid (VITAMIN C) 1000 MG tablet Take 1,000 mg by mouth daily.    [provider]  atorvastatin (LIPITOR) 20 MG tablet Take 20 mg by mouth daily.    [provider]  Calcium Citrate-Vitamin D (CALCIUM CITRATE + D3 PO) Take 1 tablet by mouth daily.    [provider]  divalproex  (DEPAKOTE ) 500 MG DR tablet Take 1 tablet (500 mg total) by mouth 2 (two) times daily. 07/08/23 01/04/24  Barbra Jayson LABOR, MD  FLUoxetine  (PROZAC ) 40 MG capsule Take 1 capsule (40 mg total) by mouth daily. 12/09/23 06/06/24  Barbra Jayson LABOR, MD  hydrochlorothiazide (HYDRODIURIL) 12.5 MG tablet Take 12.5 mg by mouth daily. 07/19/20   [provider]  ibuprofen  (ADVIL ,MOTRIN ) 200 MG tablet Take 400 mg by mouth 2 (two) times daily as needed for headache.    [provider]  levothyroxine (SYNTHROID) 75 MCG tablet Take 75 mcg by mouth every morning. 01/15/22   [provider]  loperamide (IMODIUM) 2 MG capsule Take by mouth as needed for diarrhea or loose stools.    [provider]  Multiple Vitamin (MULTIVITAMIN WITH MINERALS) TABS tablet Take 1 tablet by mouth daily.    [provider]  Omega-3 Fatty Acids (FISH OIL) 1000 MG CAPS Take 1,000 mg by mouth daily.     [provider]  oxybutynin (DITROPAN-XL) 5 MG 24 hr tablet Take 5 mg by mouth at bedtime. 06/04/22   [provider]  propranolol (INDERAL) 10 MG tablet Take 20 mg by mouth 2 (two) times daily.    [provider]  ziprasidone  (GEODON ) 40 MG capsule Take 1 capsule (40 mg total) by mouth 2 (two) times daily with a meal. 12/02/23 05/30/24  Barbra Jayson LABOR, MD    Allergies: Omnicef [cefdinir] and Penicillins    Review of Systems  All other systems reviewed and are negative.   Updated Vital Signs BP 126/61   Pulse 78   Temp 98.2 F (36.8 C)   Resp 16   Ht 5' 1 (1.549 m)   Wt 87.1 kg   LMP 03/18/2012   SpO2 94%   BMI 36.28 kg/m   Physical Exam Vitals and nursing note reviewed.  Constitutional:      General: She is not in acute distress.    Appearance: She is well-developed. She is not diaphoretic.  HENT:     Head: Normocephalic and atraumatic.   Cardiovascular:     Rate and Rhythm: Normal rate and regular rhythm.     Heart sounds: No murmur heard.    No friction rub. No gallop.  Pulmonary:     Effort: Pulmonary effort is normal. No respiratory distress.     Breath sounds: Normal breath sounds. No wheezing.  Abdominal:  General: Bowel sounds are normal. There is no distension.     Palpations: Abdomen is soft.     Tenderness: There is no abdominal tenderness.   Musculoskeletal:        General: Normal range of motion.     Cervical back: Normal range of motion and neck supple.   Skin:    General: Skin is warm and dry.   Neurological:     General: No focal deficit present.     Mental Status: She is alert and oriented to person, place, and time.     (all labs ordered are listed, but only abnormal results are displayed) Labs Reviewed - No data to display  EKG: None  Radiology: No results found.   Procedures   Medications Ordered in the ED - No data to display                                  Medical Decision Making Amount  and/or Complexity of Data Reviewed Labs: ordered. Radiology: ordered.   Patient presenting with abdominal discomfort and constipation as described in the HPI.  Patient arrives here with stable vital signs and is afebrile.  Physical examination reveals a benign, nontender abdomen.  Laboratory studies obtained including CBC, CMP, and lipase, all of which are basically unremarkable.  There is no leukocytosis, no elevation of liver or pancreatic enzymes, and no electrolyte derangement.  X-ray of the abdomen/acute abdominal series shows no significant stool burden, blockage, or other abnormality.  At this point, patient appears clinically well and I feel can safely be discharged.  Her abdomen is benign.  She does have concerns of not having had a bowel movement in the past couple of days, so she will take Dulcolax that she has at home.  Patient to return as needed for any problems.       Final diagnoses:  None    ED Discharge Orders     None          Geroldine Berg, MD 01/28/24 [phone]

## 2024-01-27 NOTE — ED Provider Notes (Incomplete)
 [place] EMERGENCY DEPARTMENT AT [facility] Provider Note   CSN: [id number] Arrival date & time: 01/27/24  2250     Patient presents with: Abdominal Pain and Headache   [name] [name] is a 63 y.o. female.  {Add pertinent medical, surgical, social history, OB history to HPI:32947} HPI     Prior to Admission medications   Medication Sig Start Date End Date Taking? Authorizing Provider  Ascorbic Acid (VITAMIN C) 1000 MG tablet Take 1,000 mg by mouth daily.    [provider]  atorvastatin (LIPITOR) 20 MG tablet Take 20 mg by mouth daily.    [provider]  Calcium Citrate-Vitamin D (CALCIUM CITRATE + D3 PO) Take 1 tablet by mouth daily.    [provider]  divalproex  (DEPAKOTE ) 500 MG DR tablet Take 1 tablet (500 mg total) by mouth 2 (two) times daily. 07/08/23 01/04/24  Barbra Jayson LABOR, MD  FLUoxetine  (PROZAC ) 40 MG capsule Take 1 capsule (40 mg total) by mouth daily. 12/09/23 06/06/24  Barbra Jayson LABOR, MD  hydrochlorothiazide (HYDRODIURIL) 12.5 MG tablet Take 12.5 mg by mouth daily. 07/19/20   [provider]  ibuprofen  (ADVIL ,MOTRIN ) 200 MG tablet Take 400 mg by mouth 2 (two) times daily as needed for headache.    [provider]  levothyroxine (SYNTHROID) 75 MCG tablet Take 75 mcg by mouth every morning. 01/15/22   [provider]  loperamide (IMODIUM) 2 MG capsule Take by mouth as needed for diarrhea or loose stools.    [provider]  Multiple Vitamin (MULTIVITAMIN WITH MINERALS) TABS tablet Take 1 tablet by mouth daily.    [provider]  Omega-3 Fatty Acids (FISH OIL) 1000 MG CAPS Take 1,000 mg by mouth daily.    [provider]  oxybutynin (DITROPAN-XL) 5 MG 24 hr tablet Take 5 mg by mouth at bedtime. 06/04/22   [provider]  propranolol (INDERAL) 10 MG tablet Take 20 mg by mouth 2 (two) times daily.    [provider]  ziprasidone  (GEODON ) 40 MG capsule Take 1  capsule (40 mg total) by mouth 2 (two) times daily with a meal. 12/02/23 05/30/24  Barbra Jayson LABOR, MD    Allergies: Omnicef [cefdinir] and Penicillins    Review of Systems  Updated Vital Signs BP 126/61   Pulse 78   Temp 98.2 F (36.8 C)   Resp 16   Ht 5' 1 (1.549 m)   Wt 87.1 kg   LMP 03/18/2012   SpO2 94%   BMI 36.28 kg/m   Physical Exam  (all labs ordered are listed, but only abnormal results are displayed) Labs Reviewed - No data to display  EKG: None  Radiology: No results found.  {Document cardiac monitor, telemetry assessment procedure when appropriate:32947} Procedures   Medications Ordered in the ED - No data to display    {Click here for ABCD2, HEART and other calculators REFRESH Note before signing:1}                              Medical Decision Making Amount and/or Complexity of Data Reviewed Labs: ordered. Radiology: ordered.   ***  {Document critical care time when appropriate  Document review of labs and clinical decision tools ie CHADS2VASC2, etc  Document your independent review of radiology images and any outside records  Document your discussion with family members, caretakers and with consultants  Document social determinants of health affecting pt's care  Document your decision making why or why not admission, treatments were needed:32947:::1}   Final diagnoses:  None    ED Discharge Orders     None

## 2024-01-27 NOTE — ED Notes (Signed)
 Patient transported to X-ray

## 2024-01-27 NOTE — ED Triage Notes (Signed)
 Pt states that she started having stomach cramps around lunch today. Pt went to sleep and woke up tonight with a headache. Pt states that she has not had a BM since Sunday and that is unlike her normal pattern.

## 2024-01-28 LAB — COMPREHENSIVE METABOLIC PANEL WITH GFR
ALT: 14 U/L (ref 0–44)
AST: 20 U/L (ref 15–41)
Albumin: 3.7 g/dL (ref 3.5–5.0)
Alkaline Phosphatase: 54 U/L (ref 38–126)
Anion gap: 8 (ref 5–15)
BUN: 15 mg/dL (ref 8–23)
CO2: 28 mmol/L (ref 22–32)
Calcium: 9.2 mg/dL (ref 8.9–10.3)
Chloride: 102 mmol/L (ref 98–111)
Creatinine, Ser: 0.61 mg/dL (ref 0.44–1.00)
GFR, Estimated: >60 mL/min (ref >60)
Glucose, Bld: 113 mg/dL — ABNORMAL HIGH (ref 70–99)
Potassium: 3.9 mmol/L (ref 3.5–5.1)
Sodium: 138 mmol/L (ref 135–145)
Total Bilirubin: 0.7 mg/dL (ref 0.0–1.2)
Total Protein: 7 g/dL (ref 6.5–8.1)

## 2024-01-28 LAB — CBC WITH DIFFERENTIAL/PLATELET
Abs Immature Granulocytes: 0.05 10*3/uL (ref 0.00–0.07)
Basophils Absolute: 0.1 10*3/uL (ref 0.0–0.1)
Basophils Relative: 1 %
Eosinophils Absolute: 0.1 10*3/uL (ref 0.0–0.5)
Eosinophils Relative: 1 %
HCT: 42.8 % (ref 36.0–46.0)
Hemoglobin: 14.5 g/dL (ref 12.0–15.0)
Immature Granulocytes: 1 %
Lymphocytes Relative: 20 %
Lymphs Abs: 1.7 10*3/uL (ref 0.7–4.0)
MCH: 31 pg (ref 26.0–34.0)
MCHC: 33.9 g/dL (ref 30.0–36.0)
MCV: 91.6 fL (ref 80.0–100.0)
Monocytes Absolute: 0.8 10*3/uL (ref 0.1–1.0)
Monocytes Relative: 9 %
Neutro Abs: 5.7 10*3/uL (ref 1.7–7.7)
Neutrophils Relative %: 68 %
Platelets: 209 10*3/uL (ref 150–400)
RBC: 4.67 MIL/uL (ref 3.87–5.11)
RDW: 13.2 % (ref 11.5–15.5)
WBC: 8.3 10*3/uL (ref 4.0–10.5)
nRBC: 0 % (ref 0.0–0.2)

## 2024-01-28 LAB — LIPASE, BLOOD: Lipase: 26 U/L (ref 11–51)

## 2024-01-28 NOTE — Discharge Instructions (Signed)
 Take the Dulcolax as per package instructions.  Return to the ER if you develop severe abdominal pain, high fevers, bloody stools, or for other new and concerning symptoms.

## 2024-01-29 ENCOUNTER — Ambulatory Visit (INDEPENDENT_AMBULATORY_CARE_PROVIDER_SITE_OTHER): Admitting: Psychiatry

## 2024-01-29 DIAGNOSIS — F251 Schizoaffective disorder, depressive type: Secondary | ICD-10-CM | POA: Diagnosis not present

## 2024-01-29 NOTE — Progress Notes (Signed)
 IN- PERSON THERAPIST PROGRESS NOTE              Session Time:  Thursday  01/29/2024 10:12 AM - 10:59 AM      Participation Level: Active  Behavioral Response: less depressed, talkative, tangentiality    Type of Therapy: Individual Therapy  Treatment Goals addressed: [name] [name] will score less than 10 on the patient health questionnaire  Progress on goals: Progressing  Interventions: Supportive/CBT  Summary: [name] [name] is a 63 y.o. female who is a returning patient to this clinician. She  presents with a long-standing history of symptoms of anxiety and depression along with a previous diagnosis of schizoaffective disorder. She  reports a history of at least 3 psychiatric hospitalizations from 16 through 1990.   She has received outpatient services from [facility] and [facility].     Patient last was seen about 2 weeks ago.  She reports being a little down since last session. Triggers appear to be being called in the office on  her job. There was a miscommunication regarding pt's use of supplies and supplies are stored. Clarification was given and pt now understands but appears to have some guilt about leaving supplies in unauthorized place. She also has been sick due to GI issues. She thinks she had food poison. She reports not sleeping weill last night due to being sick. She also reports stress regarding working with her aunt and uncle as she is concerned about her financial issues.  She is thankful for their help but expresses concern concern about actually budgeting her money.hShe reports continued medication compliance.  She denies any SI, HI.  Suicidal/Homicidal: Nowithout intent/plan           Therapist Response:  reviewed symptoms, discussed stressors, facilitated expression of thoughts and feelings, validated feelings, assisted patient identify ways to express her concerns to her aunt and uncle, encouraged patient to maintain consistent involvement in activity and use  daily planning, discussed possible referral to psychiatrist as Dr. Barbra has left this practice, discussed patient contacting PCP regarding medication management in the interim   Plan: Return in 2-3  weeks        Diagnosis: Axis I: Schizoaffective Disorder     Collaboration of Care: Psychiatrist AEB patient sees psychiatrist Dr. Barbra  Patient/Guardian was advised Release of Information must be obtained prior to any record release in order to collaborate their care with an outside provider. Patient/Guardian was advised if they have not already done so to contact the registration department to sign all necessary forms in order for us  to release information regarding their care.   Consent: Patient/Guardian gives verbal consent for treatment and assignment of benefits for services provided during this visit. Patient/Guardian expressed understanding and agreed to proceed.    Winton FORBES Rubinstein, LCSW 01/29/2024

## 2024-02-05 DIAGNOSIS — E782 Mixed hyperlipidemia: Secondary | ICD-10-CM | POA: Diagnosis not present

## 2024-02-05 DIAGNOSIS — E039 Hypothyroidism, unspecified: Secondary | ICD-10-CM | POA: Diagnosis not present

## 2024-02-05 DIAGNOSIS — I10 Essential (primary) hypertension: Secondary | ICD-10-CM | POA: Diagnosis not present

## 2024-02-12 ENCOUNTER — Other Ambulatory Visit (HOSPITAL_COMMUNITY): Payer: Self-pay | Admitting: Family Medicine

## 2024-02-12 ENCOUNTER — Ambulatory Visit (HOSPITAL_COMMUNITY): Admitting: Psychiatry

## 2024-02-12 DIAGNOSIS — F251 Schizoaffective disorder, depressive type: Secondary | ICD-10-CM | POA: Diagnosis not present

## 2024-02-12 DIAGNOSIS — Z13820 Encounter for screening for osteoporosis: Secondary | ICD-10-CM

## 2024-02-12 NOTE — Progress Notes (Signed)
 IN- PERSON THERAPIST PROGRESS NOTE              Session Time:  Thursday  02/12/2024 10:04 AM - 10:51 AM      Participation Level: Active  Behavioral Response: less depressed, talkative, tangentiality    Type of Therapy: Individual Therapy  Treatment Goals addressed: [name] will score less than 10 on the patient health questionnaire  Progress on goals: Progressing  Interventions: Supportive/CBT  Summary: [name] [name] is a 63 y.o. female who is a returning patient to this clinician. She  presents with a long-standing history of symptoms of anxiety and depression along with a previous diagnosis of schizoaffective disorder. She  reports a history of at least 3 psychiatric hospitalizations from 49 through 1990.   She has received outpatient services from [facility] and [facility].     Patient last was seen about 2 weeks ago.  She reports decreased intensity of symptoms of depression since last session as reflected in the PHQ 2 and 9.  She reports increased socialization as she has been doing more activities with her cousin.  She also reports decreased stress as she continues to work with her aunt and uncle regarding her financial affairs.  She expresses frustration with self as she recently loaned money to someone but really did not want to make the loan.  Patient reports continued efforts regarding medication compliance but sometimes reports sometimes missing a dose.  She also reports sometimes having thoughts of not being as good as other people because she takes medicine. She denies any SI, HI.  Suicidal/Homicidal: Nowithout intent/plan           Therapist Response:  reviewed symptoms, praised and reinforced patient's efforts to increase socialization, discussed effects, assisted patient identify ways to maintain social involvement with other members of her support system, discussed medication compliance, assisted patient challenge and replace thoughts of not being good as  other people's that she takes medicine with more rational thoughts, facilitated patient expressing thoughts and feelings regarding recent incident of loaning money, assisted patient identify alternative ways she could have managed the situation, discussed taking a pause and giving self time to think about it when she has difficulty saying no, assisted patient identify personal rights to promote effective assertion    Plan: Return in 2-3  weeks        Diagnosis: Axis I: Schizoaffective Disorder              Collaboration of Care: Psychiatrist AEB patient sees psychiatrist Dr. Barbra  Patient/Guardian was advised Release of Information must be obtained prior to any record release in order to collaborate their care with an outside provider. Patient/Guardian was advised if they have not already done so to contact the registration department to sign all necessary forms in order for us  to release information regarding their care.   Consent: Patient/Guardian gives verbal consent for treatment and assignment of benefits for services provided during this visit. Patient/Guardian expressed understanding and agreed to proceed.    Winton FORBES Rubinstein, LCSW 02/12/2024

## 2024-02-19 ENCOUNTER — Other Ambulatory Visit (HOSPITAL_COMMUNITY): Payer: Self-pay | Admitting: Internal Medicine

## 2024-02-19 DIAGNOSIS — Z1231 Encounter for screening mammogram for malignant neoplasm of breast: Secondary | ICD-10-CM

## 2024-02-26 ENCOUNTER — Ambulatory Visit (INDEPENDENT_AMBULATORY_CARE_PROVIDER_SITE_OTHER): Admitting: Psychiatry

## 2024-02-26 DIAGNOSIS — F251 Schizoaffective disorder, depressive type: Secondary | ICD-10-CM

## 2024-02-26 NOTE — Progress Notes (Signed)
 IN- PERSON THERAPIST PROGRESS NOTE              Session Time:  Thursday  02/26/2024 10:14 AM - 11:04 AM      Participation Level: Active  Behavioral Response: less depressed, talkative, tangentiality    Type of Therapy: Individual Therapy  Treatment Goals addressed: [name] will score less than 10 on the patient health questionnaire  Progress on goals: Progressing  Interventions: Supportive/CBT  Summary: [name] [name] is a 63 y.o. female who is a returning patient to this clinician. She  presents with a long-standing history of symptoms of anxiety and depression along with a previous diagnosis of schizoaffective disorder. She  reports a history of at least 3 psychiatric hospitalizations from 19 through 1990.   She has received outpatient services from [facility] and [facility].     Patient last was seen about 2 weeks ago.  She reports increased stress and disappointment regarding her relationship with one of her cousins as he has been more distant and has not called her in over a week.  She dislikes this but has not allowed this to cause her to feel overwhelmed.  She also expresses disappointment in stress regarding interaction with a friend.  Per her report, her friend misunderstood her actions in a recent situation.  She also expresses frustration with self as the person she recently loaned money to did not repay her as promised.  Patient reports feeling sad and lonely along with sometimes having thoughts of she wishes she was not here but denies any plan or intent to harm self.  She reports continued strong support from her aunt and uncle.  Patient reports consistent medication compliance since last session.  Suicidal/Homicidal: Nowithout intent/plan           Therapist Response:  reviewed symptoms, discussed stressors, facilitated expression of thoughts and feelings, validated feelings, assisted patient challenge and replace negative thoughts with more rational thoughts,  assisted patient identify ways to increase behavioral activation/socialization to cope with feelings of loneliness and sadness, also assisted patient identify her positive relationships and ways to nurture, praised and reinforced patient's medication compliance   Plan: Return in 2-3  weeks        Diagnosis: Axis I: Schizoaffective Disorder              Collaboration of Care: Psychiatrist AEB patient sees psychiatrist Dr. Barbra  Patient/Guardian was advised Release of Information must be obtained prior to any record release in order to collaborate their care with an outside provider. Patient/Guardian was advised if they have not already done so to contact the registration department to sign all necessary forms in order for us  to release information regarding their care.   Consent: Patient/Guardian gives verbal consent for treatment and assignment of benefits for services provided during this visit. Patient/Guardian expressed understanding and agreed to proceed.    Winton FORBES Rubinstein, LCSW 02/26/2024

## 2024-04-22 ENCOUNTER — Ambulatory Visit (HOSPITAL_COMMUNITY): Admitting: Psychiatry

## 2024-04-22 ENCOUNTER — Ambulatory Visit (HOSPITAL_COMMUNITY)
Admission: RE | Admit: 2024-04-22 | Discharge: 2024-04-22 | Disposition: A | Discharge status: Home / Self Care | Source: Ambulatory Visit | Attending: Family Medicine | Admitting: Family Medicine

## 2024-04-22 ENCOUNTER — Ambulatory Visit (HOSPITAL_COMMUNITY)
Admission: RE | Admit: 2024-04-22 | Discharge: 2024-04-22 | Disposition: A | Discharge status: Home / Self Care | Source: Ambulatory Visit | Attending: Internal Medicine | Admitting: Internal Medicine

## 2024-04-22 DIAGNOSIS — F251 Schizoaffective disorder, depressive type: Secondary | ICD-10-CM

## 2024-04-22 DIAGNOSIS — Z1231 Encounter for screening mammogram for malignant neoplasm of breast: Secondary | ICD-10-CM | POA: Insufficient documentation

## 2024-04-22 DIAGNOSIS — Z13820 Encounter for screening for osteoporosis: Secondary | ICD-10-CM | POA: Insufficient documentation

## 2024-04-22 DIAGNOSIS — M8589 Other specified disorders of bone density and structure, multiple sites: Secondary | ICD-10-CM | POA: Diagnosis not present

## 2024-04-22 DIAGNOSIS — Z780 Asymptomatic menopausal state: Secondary | ICD-10-CM | POA: Diagnosis not present

## 2024-04-22 DIAGNOSIS — F259 Schizoaffective disorder, unspecified: Secondary | ICD-10-CM

## 2024-04-22 NOTE — Progress Notes (Signed)
 IN- PERSON THERAPIST PROGRESS NOTE              Session Time:  Thursday  04/22/2024 11:12 AM - 11:59 AM      Participation Level: Active  Behavioral Response: less depressed, talkative, tangentiality    Type of Therapy: Individual Therapy  Treatment Goals addressed: Maury will score less than 10 on the patient health questionnaire  Progress on goals: Progressing  Interventions: Supportive/CBT  Summary: [name] [name] is a 63 y.o. female who is a returning patient to this clinician. She  presents with a long-standing history of symptoms of anxiety and depression along with a previous diagnosis of schizoaffective disorder. She  reports a history of at least 3 psychiatric hospitalizations from 89 through 1990.   She has received outpatient services from [facility] and [facility].     Patient last was seen about 2 months ago.  She reports increased involvement in activity and socialization since last session.  Per patient's report, she enjoyed a trip to the beach with her cousin near the first of the month.  However, she reports to experiencing increased stress, anxiety, and depressed mood within the past week.  This was triggered by an incident at work where patient talked with her supervisor about a complaint related to another coworker.  Patient reports feeling guilty about her actions and experiencing thoughts about the coworker being upset with patient.  She also reported having thoughts of the coworker trying to do things to cause patient to want to quit her job.  However, patient cannot identify any information to support her thoughts.  She reports continued strong support from her aunt and uncle.  Patient reports consistent medication compliance since last session.  Suicidal/Homicidal: Nowithout intent/plan           Therapist Response:  reviewed symptoms, discussed stressors, facilitated expression of thoughts and feelings, validated feelings, assisted patient challenge  and replace unhelpful thoughts with more rational thoughts, assisted patient identify the connection between stress/anxiety on the increase thought issues related to her diagnoses, discussed rationale for patient practicing deep breathing to trigger a relaxation response when stressed, developed plan with patient to practice, encouraged patient to maintain consistent behavioral activation and socialization assisted patient identify ways to increase behavioral activation/socialization to cope with feelings of loneliness and sadness, also assisted patient identify her positive relationships and ways to nurture, praised and reinforced patient's medication compliance   Plan: Return in 2-3  weeks        Diagnosis: Axis I: Schizoaffective Disorder              Collaboration of Care: Psychiatrist AEB patient sees psychiatrist Dr. Barbra  Patient/Guardian was advised Release of Information must be obtained prior to any record release in order to collaborate their care with an outside provider. Patient/Guardian was advised if they have not already done so to contact the registration department to sign all necessary forms in order for us  to release information regarding their care.   Consent: Patient/Guardian gives verbal consent for treatment and assignment of benefits for services provided during this visit. Patient/Guardian expressed understanding and agreed to proceed.    Winton FORBES Rubinstein, LCSW 04/22/2024

## 2024-05-06 ENCOUNTER — Ambulatory Visit (HOSPITAL_COMMUNITY): Admitting: Psychiatry

## 2024-05-06 ENCOUNTER — Other Ambulatory Visit (HOSPITAL_COMMUNITY): Payer: Self-pay | Admitting: Psychiatry

## 2024-05-06 DIAGNOSIS — F411 Generalized anxiety disorder: Secondary | ICD-10-CM

## 2024-05-06 DIAGNOSIS — F251 Schizoaffective disorder, depressive type: Secondary | ICD-10-CM

## 2024-05-06 NOTE — Progress Notes (Signed)
 IN- PERSON THERAPIST PROGRESS NOTE              Session Time:  Thursday  05/06/2024 10:10 AM -  10:56 AN      Participation Level: Active  Behavioral Response: less depressed, talkative, tangentiality    Type of Therapy: Individual Therapy  Treatment Goals addressed: Idali will score less than 10 on the patient health questionnaire  Progress on goals: Progressing  Interventions: Supportive/CBT  Summary: [name] [name] is a 63 y.o. female who is a returning patient to this clinician. She  presents with a long-standing history of symptoms of anxiety and depression along with a previous diagnosis of schizoaffective disorder. She  reports a history of at least 3 psychiatric hospitalizations from 81 through 1990.   She has received outpatient services from [facility] and [facility].     Patient last was seen about 2 weeks ago.  She reports continued  involvement in activity and socialization since last session.  However, she reports increased stress and worry that appears to be triggered by sleep difficulty and her recent attendance at church.  Patient reports watching a disturbing TV show this past Saturday night and being late for church on Sunday.  She perceived a relative at the church was staring at her and was thinking negative thoughts about patient.  This triggered patient having more thoughts and regrets about previous church experiences with increased critical thoughts about self.  She also expresses frustration with self as she reports ability managing money times as she reports her aunt and uncle remain concerned people may be taking advantage of patient.  She also reports sometimes impulsive buying.  Patient reports continued medication compliance suicidal/Homicidal: Nowithout intent/plan           Therapist Response:  reviewed symptoms, discussed stressors, facilitated expression of thoughts and feelings, validated feelings, discussed effects of thought disorder,  assisted patient challenge and replace unhelpful thoughts with more rational thoughts, assisted patient identify the connection between stress/anxiety on the increase thought issues related to her diagnoses, assisted patient identify triggers regarding impulsive spending on self as well as others, assisted patient identify ways to take a pause as well as use her support system of her aunt and uncle regarding money management  praised and reinforced patient's medication compliance   Plan: Return in 2-3  weeks        Diagnosis: Axis I: Schizoaffective Disorder              Collaboration of Care: Psychiatrist AEB patient sees PCP for medication  management, She is scheduled to see NP Shuvan Rankin in this practice in January 2026.   Patient/Guardian was advised Release of Information must be obtained prior to any record release in order to collaborate their care with an outside provider. Patient/Guardian was advised if they have not already done so to contact the registration department to sign all necessary forms in order for us  to release information regarding their care.   Consent: Patient/Guardian gives verbal consent for treatment and assignment of benefits for services provided during this visit. Patient/Guardian expressed understanding and agreed to proceed.    Winton FORBES Rubinstein, LCSW 05/06/2024

## 2024-05-20 ENCOUNTER — Ambulatory Visit (HOSPITAL_COMMUNITY): Admitting: Psychiatry

## 2024-05-24 ENCOUNTER — Ambulatory Visit (INDEPENDENT_AMBULATORY_CARE_PROVIDER_SITE_OTHER): Admitting: Psychiatry

## 2024-05-24 DIAGNOSIS — F251 Schizoaffective disorder, depressive type: Secondary | ICD-10-CM

## 2024-05-24 DIAGNOSIS — F259 Schizoaffective disorder, unspecified: Secondary | ICD-10-CM

## 2024-05-24 NOTE — Progress Notes (Signed)
 Virtual Visit via Telephone Note  I connected with [name] [name] on 05/24/24 at 8:10 AM EDT by telephone and verified that I am speaking with the correct person using two identifiers.  Location: Patient: Car Provider: Home Office   I discussed the limitations, risks, security and privacy concerns of performing an evaluation and management service by telephone and the availability of in person appointments. I also discussed with the patient that there may be a patient responsible charge related to this service. The patient expressed understanding and agreed to proceed.    I provided 48 minutes of non-face-to-face time during this encounter.   [name] FORBES Rubinstein, LCSW   THERAPIST PROGRESS NOTE              Session Time: Monday  05/24/2024 8:10 AM - 9:58 AM       Participation Level: Active  Behavioral Response: depressed, talkative, tangentiality    Type of Therapy: Individual Therapy  Treatment Goals addressed: [name] will score less than 10 on the patient health questionnaire  Progress on goals: Progressing  Interventions: Supportive/CBT  Summary: [name] [name] is a 63 y.o. female who is a returning patient to this clinician. She  presents with a long-standing history of symptoms of anxiety and depression along with a previous diagnosis of schizoaffective disorder. She  reports a history of at least 3 psychiatric hospitalizations from 11 through 1990.   She has received outpatient services from [facility] and [facility].     Patient last was seen about 2 weeks ago.  She reports increased sadness and frustration since last session.  She also reports having thoughts of wishing she was dead at times but denies any plan or intent to harm self.  Per patient's report, she experienced recent incident at church that cause her to believe church members really do not care about her or want her around anymore. She is basing her thoughts on an incident where praise and  worship was closed before she had a chance to sing a song she wanted to sing. She reports person in charge of the service instructed everyone they were moving on to the next part of the service.  Patient also makes generalized statements about other people in her life just not caring about patient and sconcerns about the people life is not caring about her and continues with negative spiraling statements,   She eventually acknowledges continued strong support from her aunt and uncle.  She also reports recently initiating reconciling with her friend after a misunderstanding.  She is pleased with the conversation and states knowing this person does care.  Patient reports continued medication compliance.  Patient also reports feeling bad due to not taking care of her body.  She states eating too many sweets and  not exercising  Suicidal/homicidal: No, without plan, no intent.  Patient agrees to call 911, call 42, and have someone take her to the ED should symptoms worsen.  Therapist response: Reviewed symptoms, administered PHQ 2 and 9, discussed stressors, facilitated expression of thoughts and feelings, validated feelings, assisted patient identify/challenge/and replace negative thoughts with more rational thoughts, assisted patient reviewed ways to cope with feelings of depression and increase behavioral activation (listening to music, singing, reviewing list of people who care for patient, adult coloring,begin a walking regimen this week),  developed plan with patient to implement strategies discussed in session, praised and reinforced medication compliance, discussed possibility of trying to schedule an earlier medication management appointment should patient's symptoms not improve  by next session.    Plan: Return in 2-3  weeks        Diagnosis: Axis I: Schizoaffective Disorder              Collaboration of Care: Psychiatrist AEB patient sees PCP for medication  management, She is scheduled to see NP  Shuvan Rankin in this practice in January 2026.   Patient/Guardian was advised Release of Information must be obtained prior to any record release in order to collaborate their care with an outside provider. Patient/Guardian was advised if they have not already done so to contact the registration department to sign all necessary forms in order for us  to release information regarding their care.   Consent: Patient/Guardian gives verbal consent for treatment and assignment of benefits for services provided during this visit. Patient/Guardian expressed understanding and agreed to proceed.    [name] FORBES Rubinstein, LCSW 05/24/2024

## 2024-06-03 ENCOUNTER — Ambulatory Visit (HOSPITAL_COMMUNITY): Admitting: Psychiatry

## 2024-06-03 DIAGNOSIS — F251 Schizoaffective disorder, depressive type: Secondary | ICD-10-CM | POA: Diagnosis not present
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
## 2024-07-08 DIAGNOSIS — F209 Schizophrenia, unspecified: Secondary | ICD-10-CM | POA: Diagnosis not present

## 2024-07-08 DIAGNOSIS — I10 Essential (primary) hypertension: Secondary | ICD-10-CM | POA: Diagnosis not present

## 2024-07-08 DIAGNOSIS — E039 Hypothyroidism, unspecified: Secondary | ICD-10-CM | POA: Diagnosis not present

## 2024-07-08 DIAGNOSIS — R42 Dizziness and giddiness: Secondary | ICD-10-CM | POA: Diagnosis not present

## 2024-07-08 DIAGNOSIS — E782 Mixed hyperlipidemia: Secondary | ICD-10-CM | POA: Diagnosis not present

## 2024-07-10 LAB — LAB REPORT - SCANNED
A1c: 6
Albumin, Urine POC: 26.5
Creatinine, POC: 172.2 mg/dL
EGFR: 102
Microalb Creat Ratio: 15

## 2024-07-15 ENCOUNTER — Ambulatory Visit (HOSPITAL_COMMUNITY): Admitting: Psychiatry

## 2024-07-15 DIAGNOSIS — F209 Schizophrenia, unspecified: Secondary | ICD-10-CM | POA: Diagnosis not present

## 2024-08-12 ENCOUNTER — Ambulatory Visit (INDEPENDENT_AMBULATORY_CARE_PROVIDER_SITE_OTHER): Admitting: Psychiatry

## 2024-08-12 DIAGNOSIS — F251 Schizoaffective disorder, depressive type: Secondary | ICD-10-CM

## 2024-08-12 DIAGNOSIS — F259 Schizoaffective disorder, unspecified: Secondary | ICD-10-CM | POA: Diagnosis not present

## 2024-08-12 NOTE — Progress Notes (Signed)
 "              IN-PERSON  THERAPIST PROGRESS NOTE              Session Time: Thursday 08/13/2023 11:14 AM - 11:56 AM       Participation Level: Active  Behavioral Response: depressed, talkative, tangentiality    Type of Therapy: Individual Therapy  Treatment Goals addressed: [name] [name] will score less than 10 on the patient health questionnaire  Progress on goals: Progressing  Interventions: Supportive/CBT  Summary: [name] [name] is a 64 y.o. female who is a returning patient to this clinician. She  presents with a long-standing history of symptoms of anxiety and depression along with a previous diagnosis of schizoaffective disorder. She  reports a history of at least 3 psychiatric hospitalizations from 31 through 1990.   She has received outpatient services from [facility] and [facility].     Patient last was seen about 2 months ago. She reports cancelling last session as she had another appt. She reports enjoying the holidays and states it was the best Christmas she has ever had. She reports experiencing thoughts of wishing she was dead in the past two weeks but denies any plan or intent to harm self.  This was triggered by her performance evaluation in December 2025.  She scored well in most areas but scored a needs improvement regarding leadership and trust.  Patient brings a copy of the performance evaluation to session as requested by her supervisor per her report.  The evaluation indicates patient needs to work on building trust with coworkers and reducing stress she feels when she believes others may be acting against her.  Patient reports wanting to do better in these areas by her review next month.  She reports she her body feels differently when she forgets to take her medication.  Per patient's report she recently forgot to take her medication for 2 days but now has resumed regularly.  She reports she is trying to become more aware of her thoughts and not react to negative  thoughts and an unhealthy way.  She cites a recent example.  Suicidal/homicidal: No, without plan, no intent.  Patient agrees to call 911, call 93, and have someone take her to the ED should symptoms worsen.  Therapist response: Reviewed symptoms, administered PHQ 2 and 9, discussed stressors, facilitated expression of thoughts and feelings, validated feelings, assisted patient identify ways to improve medication compliance including use of pill organizer, develop plan with patient to implement use of pill organizer beginning today, also developed plan with patient to ask aunt for assistance if needed using pill organizer, developed plan with patient to follow through with medication evaluation with NP [name] [name] on August 16, 2024, praised and reinforced patient's efforts to rather than just react to her thoughts, began to discuss next steps for treatment.    Plan: Return in 2-3  weeks        Diagnosis: Axis I: Schizoaffective Disorder              Collaboration of Care: Psychiatrist AEB patient sees PCP for medication  management, She is scheduled to see NP Shuvan [name] in this practice in January 2026.   Patient/Guardian was advised Release of Information must be obtained prior to any record release in order to collaborate their care with an outside provider. Patient/Guardian was advised if they have not already done so to contact the registration department to sign all necessary forms in order for  us  to release information regarding their care.   Consent: Patient/Guardian gives verbal consent for treatment and assignment of benefits for services provided during this visit. Patient/Guardian expressed understanding and agreed to proceed.    [name] [name] Rubinstein, LCSW 08/12/2024 "

## 2024-08-16 ENCOUNTER — Encounter (HOSPITAL_COMMUNITY): Payer: Self-pay | Admitting: Registered Nurse

## 2024-08-16 ENCOUNTER — Ambulatory Visit (HOSPITAL_COMMUNITY): Admitting: Registered Nurse

## 2024-08-16 VITALS — BP 113/66 | HR 66 | Ht 61.0 in | Wt 198.0 lb

## 2024-08-16 DIAGNOSIS — F251 Schizoaffective disorder, depressive type: Secondary | ICD-10-CM | POA: Diagnosis not present

## 2024-08-16 DIAGNOSIS — G4700 Insomnia, unspecified: Secondary | ICD-10-CM

## 2024-08-16 DIAGNOSIS — F411 Generalized anxiety disorder: Secondary | ICD-10-CM | POA: Diagnosis not present

## 2024-08-16 DIAGNOSIS — Z79899 Other long term (current) drug therapy: Secondary | ICD-10-CM | POA: Diagnosis not present

## 2024-08-16 MED ORDER — FLUOXETINE HCL 40 MG PO CAPS: 40.0000 mg | ORAL_CAPSULE | Freq: Every day | ORAL | 1 refills | Status: AC

## 2024-08-16 MED ORDER — TRAZODONE HCL 50 MG PO TABS: 25.0000 mg | ORAL_TABLET | Freq: Every evening | ORAL | 1 refills | Status: AC | PRN

## 2024-08-16 MED ORDER — ZIPRASIDONE HCL 60 MG PO CAPS: 60.0000 mg | ORAL_CAPSULE | Freq: Two times a day (BID) | ORAL | 1 refills | Status: AC

## 2024-08-16 MED ORDER — PROPRANOLOL HCL 10 MG PO TABS: 20.0000 mg | ORAL_TABLET | Freq: Two times a day (BID) | ORAL | 1 refills | Status: AC

## 2024-08-16 MED ORDER — DIVALPROEX SODIUM 500 MG PO DR TAB: 500.0000 mg | DELAYED_RELEASE_TABLET | Freq: Two times a day (BID) | ORAL | 1 refills | Status: AC

## 2024-08-16 NOTE — Patient Instructions (Addendum)
 Valproic Acid  Blood Levels (Depakote ) should be checked just prior to next dose.   Examples of when to have blood drawn at lab:   If you take you medicine at 9am and 9pm.  You would need to have the level checked prior to the 9am dose (when lab is open).    If taking your medicine once daily at bedtime should have level checked as late as the lab is open to get the most accurate level.   Follow up with your primary doctor for an EKG  Call 911, [phone], mobile crisis, or present to the nearest emergency room should you experience any suicidal/homicidal ideation, auditory/visual/hallucinations, or detrimental worsening of your mental health.  Mobile Crisis Response Teams Listed by counties in vicinity of [facility] providers [facility] [phone] [facility] [phone] [facility] [phone] [facility] [phone] [place]                * [facility] [phone]                * [facility] [phone]  [facility] [phone] [facility] [employer]  [phone] * [employer] [phone]
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
## 2024-08-20 LAB — VALPROIC ACID LEVEL: Valproic Acid Lvl: 60 mg/L (ref 50.0–100.0)

## 2024-08-26 ENCOUNTER — Ambulatory Visit (HOSPITAL_COMMUNITY): Admitting: Psychiatry

## 2024-08-26 DIAGNOSIS — F251 Schizoaffective disorder, depressive type: Secondary | ICD-10-CM | POA: Diagnosis not present

## 2024-08-26 NOTE — Progress Notes (Signed)
 "              IN-PERSON  THERAPIST PROGRESS NOTE              Session Time: Thursday 08/27/2023 9:14 AM - 9:55 AM      Participation Level: Active  Behavioral Response: anxious  talkative, tangentiality    Type of Therapy: Individual Therapy  Treatment Goals addressed: [place] will score less than 10 on the patient health questionnaire  Progress on goals: Progressing  Interventions: Supportive/CBT  Summary: KARISMA MEISER is a 64 y.o. female who is a returning patient to this clinician. She  presents with a long-standing history of symptoms of anxiety and depression along with a previous diagnosis of schizoaffective disorder. She  reports a history of at least 3 psychiatric hospitalizations from 38 through 1990.   She has [facility] from [facility].     Patient last was seen about 2 weeks ago. She reports following throught with appointment with NP [facility] Ranking. She reports feeling better since taking increased dosage of Geodon  as prescribed by NP Rankin.  She reports significant improvement in sleep but reports falling asleep in her recliner and then eventually going to bed.  She reports working with her aunt to organize her medication and pillboxes.  She reports consistent medication compliance since last session.  Patient is late for today's appointment verbalizes statements of she is slow and feels like she has a rush all the time.  She then expresses worry she may lose her job when she has a follow-up meeting with her supervisor on February 16.  She reports she has noticed she is not talking as much and is able to concentrate on her job since taking increased dosage of medication.   Suicidal/homicidal: No, without plan, no intent.  Patient agrees to call 911, call [phone], and have someone take her to the ED should symptoms worsen.  Therapist response: Reviewed symptoms, praised and reinforced patient following through with medication evaluation  appointment with PT [facility] rankings.  Praised and reinforced patient's efforts to improve medication compliance and working with her aunt to organize medication, assisted patient identified the effects of use of medication, assisted patient examine her worry thoughts about work and assisted patient identify/challenge/and replace worry thoughts with more helpful thoughts, discussed the role of consistent sleep routine in coping with stress, developed plan with patient to schedule consistent bedtime and implement bedtime ritual rather than falling asleep in the recliner, began to discuss next steps for treatment including focusing more on improving self-care around managing eating patterns as patient expresses negative thoughts about self related to weight gain, will discuss more next session    Plan: Return in 2-3  weeks        Diagnosis: Axis I: Schizoaffective Disorder              Collaboration of Care: Psychiatrist AEB patient sees PCP for medication  management, She is scheduled to see NP Shuvan Rankin in this practice in January 2026.   Patient/Guardian was advised Release of Information must be obtained prior to any record release in order to collaborate their care with an outside provider. Patient/Guardian was advised if they have not already done so to contact the registration department to sign all necessary forms in order for us  to release information regarding their care.   Consent: Patient/Guardian gives verbal consent for treatment and assignment of benefits for services provided during this visit. Patient/Guardian expressed understanding and agreed  to proceed.    Winton FORBES Rubinstein, LCSW 08/26/2024 "

## 2024-08-30 ENCOUNTER — Ambulatory Visit (HOSPITAL_COMMUNITY): Admitting: Registered Nurse

## 2024-09-09 ENCOUNTER — Ambulatory Visit (HOSPITAL_COMMUNITY): Admitting: Psychiatry

## 2024-09-09 DIAGNOSIS — F251 Schizoaffective disorder, depressive type: Secondary | ICD-10-CM

## 2024-09-09 NOTE — Progress Notes (Signed)
 "              Virtual Visit via Telephone Note  I connected with [name] [name] on 09/09/24 at 10:15 AM EST  by telephone and verified that I am speaking with the correct person using two identifiers.  Location: Patient: Home Provider: [facility] Outpatient  Office   I discussed the limitations, risks, security and privacy concerns of performing an evaluation and management service by telephone and the availability of in person appointments. I also discussed with the patient that there may be a patient responsible charge related to this service. The patient expressed understanding and agreed to proceed.    I provided 35 minutes of non-face-to-face time during this encounter.   Winton FORBES Rubinstein, LCSW             Session Time: Thursday 09/10/2023 10:15 AM - 10:50 AM      Participation Level: Active  Behavioral Response: anxious  talkative, tangentiality    Type of Therapy: Individual Therapy  Treatment Goals addressed: Aylana will score less than 10 on the patient health questionnaire  Progress on goals: Progressing  Interventions: Supportive/CBT  Summary: [name] [name] is a 64 y.o. female who is a returning patient to this clinician. She  presents with a long-standing history of symptoms of anxiety and depression along with a previous diagnosis of schizoaffective disorder. She  reports a history of at least 3 psychiatric hospitalizations from 15 through 1990.   She has received outpatient services from [facility] and [facility].     Patient last was seen about 2 weeks ago. She states being more down in the past week.  Per patient's report, 2 of her elderly relatives died within the past week.  She reports fond memories and a good relationship with both relatives.  This also triggered memories of her deceased ex-husband and caused more feelings of sadness.  Patient denies any suicidal thoughts patient reports stress regarding recent conflict with one of her cousins.  Patient  reports feeling unsafe in her recent incident with cousin.  She has decided to distance self from cousin.  Patient reports additional stressors related to worry about her job as she reports she still makes negative statements about this.  She is scheduled to meet with her supervisor in 2 weeks and fears she will lose her job. Patient reports continued medication compliance.  suicidal/homicidal: No, without plan, no intent.  Patient agrees to call 911, call 17, and have someone take her to the ED should symptoms worsen.  Therapist response: Reviewed symptoms, facilitated patient expressing thoughts and feelings regarding the recent deaths of her relatives, normalized feelings related to grief and loss, also normalized grief and loss feelings about ex-husband being triggered by recent deaths, discussed stressors, facilitated patient expressing thoughts and feelings, validated feelings, praised and reinforced patient's efforts to set and maintain limits regarding involvement with her cousin, assist patient identify/challenge/and replace worry thoughts about her job with more helpful thoughts, developed plan with patient to use replacement statements     Plan: Return in 2-3  weeks        Diagnosis: Axis I: Schizoaffective Disorder              Collaboration of Care: Psychiatrist AEB patient sees PCP for medication  management, She is scheduled to see NP Shuvan Rankin in this practice in January 2026.   Patient/Guardian was advised Release of Information must be obtained prior to any record release in order to collaborate their  care with an outside provider. Patient/Guardian was advised if they have not already done so to contact the registration department to sign all necessary forms in order for us  to release information regarding their care.   Consent: Patient/Guardian gives verbal consent for treatment and assignment of benefits for services provided during this visit. Patient/Guardian expressed  understanding and agreed to proceed.    Shamus Desantis E Leeam Cedrone, LCSW 09/09/2024 "

## 2024-09-23 ENCOUNTER — Ambulatory Visit (HOSPITAL_COMMUNITY): Admitting: Psychiatry

## 2024-10-21 ENCOUNTER — Ambulatory Visit (HOSPITAL_COMMUNITY): Admitting: Psychiatry

## 2024-11-15 ENCOUNTER — Ambulatory Visit (HOSPITAL_COMMUNITY): Admitting: Registered Nurse
